# Patient Record
Sex: Female | Born: 1935 | Race: White | Hispanic: No | Marital: Married | Smoking: Former smoker
Health system: Southern US, Community
[De-identification: ages and names within clinical notes are randomized; demographics above are authoritative.]

## PROBLEM LIST (undated history)

## (undated) DIAGNOSIS — G8929 Other chronic pain: Secondary | ICD-10-CM

## (undated) DIAGNOSIS — N6019 Diffuse cystic mastopathy of unspecified breast: Secondary | ICD-10-CM

## (undated) DIAGNOSIS — E785 Hyperlipidemia, unspecified: Secondary | ICD-10-CM

## (undated) DIAGNOSIS — K5792 Diverticulitis of intestine, part unspecified, without perforation or abscess without bleeding: Secondary | ICD-10-CM

## (undated) DIAGNOSIS — C50919 Malignant neoplasm of unspecified site of unspecified female breast: Secondary | ICD-10-CM

## (undated) DIAGNOSIS — K921 Melena: Secondary | ICD-10-CM

## (undated) DIAGNOSIS — M549 Dorsalgia, unspecified: Secondary | ICD-10-CM

## (undated) DIAGNOSIS — E039 Hypothyroidism, unspecified: Secondary | ICD-10-CM

## (undated) DIAGNOSIS — K449 Diaphragmatic hernia without obstruction or gangrene: Secondary | ICD-10-CM

## (undated) DIAGNOSIS — G43909 Migraine, unspecified, not intractable, without status migrainosus: Secondary | ICD-10-CM

## (undated) DIAGNOSIS — K219 Gastro-esophageal reflux disease without esophagitis: Secondary | ICD-10-CM

## (undated) DIAGNOSIS — M1990 Unspecified osteoarthritis, unspecified site: Secondary | ICD-10-CM

## (undated) DIAGNOSIS — Z87442 Personal history of urinary calculi: Secondary | ICD-10-CM

## (undated) DIAGNOSIS — Z8489 Family history of other specified conditions: Secondary | ICD-10-CM

## (undated) DIAGNOSIS — C4491 Basal cell carcinoma of skin, unspecified: Secondary | ICD-10-CM

## (undated) DIAGNOSIS — K5790 Diverticulosis of intestine, part unspecified, without perforation or abscess without bleeding: Secondary | ICD-10-CM

## (undated) DIAGNOSIS — K635 Polyp of colon: Secondary | ICD-10-CM

## (undated) HISTORY — PX: ABDOMINAL HYSTERECTOMY: SHX81

## (undated) HISTORY — PX: KNEE ARTHROSCOPY: SUR90

## (undated) HISTORY — DX: Diaphragmatic hernia without obstruction or gangrene: K44.9

## (undated) HISTORY — DX: Diverticulosis of intestine, part unspecified, without perforation or abscess without bleeding: K57.90

## (undated) HISTORY — PX: CATARACT EXTRACTION, BILATERAL: SHX1313

## (undated) HISTORY — DX: Diffuse cystic mastopathy of unspecified breast: N60.19

## (undated) HISTORY — DX: Melena: K92.1

## (undated) HISTORY — DX: Hyperlipidemia, unspecified: E78.5

## (undated) HISTORY — DX: Gastro-esophageal reflux disease without esophagitis: K21.9

## (undated) HISTORY — DX: Unspecified osteoarthritis, unspecified site: M19.90

## (undated) HISTORY — PX: ESOPHAGOGASTRODUODENOSCOPY: SHX1529

## (undated) HISTORY — DX: Diverticulitis of intestine, part unspecified, without perforation or abscess without bleeding: K57.92

## (undated) HISTORY — DX: Polyp of colon: K63.5

## (undated) HISTORY — DX: Migraine, unspecified, not intractable, without status migrainosus: G43.909

## (undated) HISTORY — DX: Basal cell carcinoma of skin, unspecified: C44.91

---

## 1978-09-23 HISTORY — PX: VEIN LIGATION: SHX2652

## 1979-09-24 HISTORY — PX: TOTAL ABDOMINAL HYSTERECTOMY W/ BILATERAL SALPINGOOPHORECTOMY: SHX83

## 1984-09-23 HISTORY — PX: BLADDER SUSPENSION: SHX72

## 1988-09-23 HISTORY — PX: FOOT SURGERY: SHX648

## 1998-06-22 ENCOUNTER — Other Ambulatory Visit: Admission: RE | Admit: 1998-06-22 | Discharge: 1998-06-22 | Payer: Self-pay | Admitting: Obstetrics and Gynecology

## 1999-09-24 HISTORY — PX: HEEL SPUR EXCISION: SHX1733

## 1999-11-08 ENCOUNTER — Other Ambulatory Visit: Admission: RE | Admit: 1999-11-08 | Discharge: 1999-11-08 | Payer: Self-pay | Admitting: Obstetrics and Gynecology

## 1999-11-13 ENCOUNTER — Encounter: Payer: Self-pay | Admitting: Emergency Medicine

## 1999-11-13 ENCOUNTER — Emergency Department (HOSPITAL_COMMUNITY): Admission: EM | Admit: 1999-11-13 | Discharge: 1999-11-13 | Payer: Self-pay | Admitting: Emergency Medicine

## 2000-01-29 ENCOUNTER — Encounter: Payer: Self-pay | Admitting: Family Medicine

## 2000-01-29 ENCOUNTER — Ambulatory Visit (HOSPITAL_COMMUNITY): Admission: RE | Admit: 2000-01-29 | Discharge: 2000-01-29 | Payer: Self-pay | Admitting: Family Medicine

## 2000-09-23 HISTORY — PX: CHOLECYSTECTOMY: SHX55

## 2000-11-03 ENCOUNTER — Encounter: Payer: Self-pay | Admitting: Emergency Medicine

## 2000-11-03 ENCOUNTER — Encounter: Payer: Self-pay | Admitting: *Deleted

## 2000-11-03 ENCOUNTER — Emergency Department (HOSPITAL_COMMUNITY): Admission: EM | Admit: 2000-11-03 | Discharge: 2000-11-03 | Payer: Self-pay | Admitting: Emergency Medicine

## 2000-11-10 ENCOUNTER — Ambulatory Visit (HOSPITAL_COMMUNITY): Admission: RE | Admit: 2000-11-10 | Discharge: 2000-11-11 | Payer: Self-pay | Admitting: General Surgery

## 2000-11-10 ENCOUNTER — Encounter (INDEPENDENT_AMBULATORY_CARE_PROVIDER_SITE_OTHER): Payer: Self-pay | Admitting: *Deleted

## 2000-11-10 ENCOUNTER — Encounter (HOSPITAL_BASED_OUTPATIENT_CLINIC_OR_DEPARTMENT_OTHER): Payer: Self-pay | Admitting: General Surgery

## 2003-05-24 ENCOUNTER — Other Ambulatory Visit: Admission: RE | Admit: 2003-05-24 | Discharge: 2003-05-24 | Payer: Self-pay | Admitting: Family Medicine

## 2003-07-18 ENCOUNTER — Encounter: Payer: Self-pay | Admitting: Family Medicine

## 2003-07-18 ENCOUNTER — Ambulatory Visit (HOSPITAL_COMMUNITY): Admission: RE | Admit: 2003-07-18 | Discharge: 2003-07-18 | Payer: Self-pay | Admitting: Family Medicine

## 2004-06-21 ENCOUNTER — Ambulatory Visit (HOSPITAL_COMMUNITY): Admission: RE | Admit: 2004-06-21 | Discharge: 2004-06-21 | Payer: Self-pay | Admitting: Family Medicine

## 2004-08-30 ENCOUNTER — Ambulatory Visit (HOSPITAL_COMMUNITY): Admission: RE | Admit: 2004-08-30 | Discharge: 2004-08-30 | Payer: Self-pay | Admitting: Family Medicine

## 2005-05-17 ENCOUNTER — Encounter: Admission: RE | Admit: 2005-05-17 | Discharge: 2005-05-17 | Payer: Self-pay | Admitting: Family Medicine

## 2005-10-01 ENCOUNTER — Encounter: Admission: RE | Admit: 2005-10-01 | Discharge: 2005-10-01 | Payer: Self-pay | Admitting: Family Medicine

## 2005-11-04 ENCOUNTER — Ambulatory Visit (HOSPITAL_COMMUNITY): Admission: RE | Admit: 2005-11-04 | Discharge: 2005-11-04 | Payer: Self-pay | Admitting: Neurosurgery

## 2005-11-07 ENCOUNTER — Other Ambulatory Visit: Admission: RE | Admit: 2005-11-07 | Discharge: 2005-11-07 | Payer: Self-pay | Admitting: Family Medicine

## 2006-12-12 ENCOUNTER — Ambulatory Visit (HOSPITAL_COMMUNITY): Admission: RE | Admit: 2006-12-12 | Discharge: 2006-12-12 | Payer: Self-pay | Admitting: Family Medicine

## 2006-12-23 HISTORY — PX: COLONOSCOPY: SHX174

## 2009-08-24 ENCOUNTER — Encounter: Admission: RE | Admit: 2009-08-24 | Discharge: 2009-08-24 | Payer: Self-pay | Admitting: Gastroenterology

## 2010-10-12 ENCOUNTER — Encounter
Admission: RE | Admit: 2010-10-12 | Discharge: 2010-10-12 | Payer: Self-pay | Source: Home / Self Care | Attending: Gastroenterology | Admitting: Gastroenterology

## 2011-02-08 NOTE — Op Note (Signed)
[place]  Patient:    Jamie Beltran, Jamie Beltran                      MRN: [id number] Proc. Date: 11/10/00 Adm. Date:  [id number] Attending:  Fortino Sic CC:         Lurena Joiner, M.D.   Operative Report  PREOPERATIVE DIAGNOSIS:  Symptomatic gallstones.  POSTOPERATIVE DIAGNOSIS:  Symptomatic gallstones.  OPERATION PERFORMED:  Laparoscopic cholecystectomy with operative cholangiogram.  SURGEON:  Marnee Spring. Wiliam Ke, M.D.  ASSISTANT:  Mardene Celeste. Lurene Shadow, M.D.  ANESTHESIA:  Endotracheal by hospital.  PROCEDURE:  Under endotracheal anesthesia, the abdomen was prepped and draped in the usual manner.  An incision was made above the umbilicus.  The abdomen was entered.  A pursestring suture was placed.  A Hasson cannula was inserted. Pneumoperitoneum was obtained.  The peritoneal surfaces appeared normal.  A 10 mm and two 5 mm cannulas were placed in the usual fashion.  ______ was passed.  The cystic duct and gallbladder junction was identified.  The cystic duct was opened and cannulated with the Reddick catheter.  A cholangiogram was performed and was normal.  The cystic artery was triply clipped ______ clips proximally, and the cystic duct was divided after the Reddick catheter was removed, and two clips were left proximally.  The gallbladder was removed from the liver bed with electrocautery current, and hemostasis was excellent.  The gallbladder was removed through the umbilical port ______ and sucked dry. The cannula was removed.  The pursestring suture was tied.  The skin wounds were closed with subcuticular 4-0 Dexon, and Steri-Strips were applied.  The estimated blood loss was minimal.  The patient received no blood.  She left the operating room in satisfactory condition after sponge and needle counts were verified. DD:  11/10/00 TD:  11/10/00 Job: [id number]

## 2011-07-08 ENCOUNTER — Other Ambulatory Visit: Payer: Self-pay | Admitting: Family Medicine

## 2011-07-08 DIAGNOSIS — H93A9 Pulsatile tinnitus, unspecified ear: Secondary | ICD-10-CM

## 2011-07-11 ENCOUNTER — Ambulatory Visit
Admission: RE | Admit: 2011-07-11 | Discharge: 2011-07-11 | Disposition: A | Discharge status: Home / Self Care | Payer: Medicare Other | Source: Ambulatory Visit | Attending: Family Medicine | Admitting: Family Medicine

## 2011-07-11 DIAGNOSIS — H93A9 Pulsatile tinnitus, unspecified ear: Secondary | ICD-10-CM

## 2011-09-24 ENCOUNTER — Ambulatory Visit (INDEPENDENT_AMBULATORY_CARE_PROVIDER_SITE_OTHER): Payer: Medicare Other

## 2011-09-24 DIAGNOSIS — R05 Cough: Secondary | ICD-10-CM

## 2011-09-24 DIAGNOSIS — R059 Cough, unspecified: Secondary | ICD-10-CM

## 2011-10-24 ENCOUNTER — Other Ambulatory Visit: Payer: Self-pay | Admitting: Neurosurgery

## 2011-10-24 DIAGNOSIS — M25561 Pain in right knee: Secondary | ICD-10-CM

## 2011-10-26 ENCOUNTER — Ambulatory Visit
Admission: RE | Admit: 2011-10-26 | Discharge: 2011-10-26 | Disposition: A | Discharge status: Home / Self Care | Payer: Medicare Other | Source: Ambulatory Visit | Attending: Neurosurgery | Admitting: Neurosurgery

## 2011-10-26 DIAGNOSIS — M25561 Pain in right knee: Secondary | ICD-10-CM

## 2011-10-28 ENCOUNTER — Other Ambulatory Visit: Payer: Self-pay | Admitting: Family Medicine

## 2011-10-28 DIAGNOSIS — R51 Headache: Secondary | ICD-10-CM

## 2011-11-18 ENCOUNTER — Ambulatory Visit
Admission: RE | Admit: 2011-11-18 | Discharge: 2011-11-18 | Disposition: A | Discharge status: Home / Self Care | Payer: Medicare Other | Source: Ambulatory Visit | Attending: Family Medicine | Admitting: Family Medicine

## 2011-11-18 DIAGNOSIS — R51 Headache: Secondary | ICD-10-CM

## 2011-12-11 ENCOUNTER — Other Ambulatory Visit: Payer: Self-pay | Admitting: Neurosurgery

## 2011-12-11 DIAGNOSIS — M47816 Spondylosis without myelopathy or radiculopathy, lumbar region: Secondary | ICD-10-CM

## 2011-12-16 ENCOUNTER — Ambulatory Visit
Admission: RE | Admit: 2011-12-16 | Discharge: 2011-12-16 | Disposition: A | Discharge status: Home / Self Care | Payer: Medicare Other | Source: Ambulatory Visit | Attending: Neurosurgery | Admitting: Neurosurgery

## 2011-12-16 DIAGNOSIS — M47816 Spondylosis without myelopathy or radiculopathy, lumbar region: Secondary | ICD-10-CM

## 2012-01-02 ENCOUNTER — Other Ambulatory Visit: Payer: Self-pay | Admitting: Neurosurgery

## 2012-01-02 DIAGNOSIS — M47816 Spondylosis without myelopathy or radiculopathy, lumbar region: Secondary | ICD-10-CM

## 2012-01-03 ENCOUNTER — Ambulatory Visit
Admission: RE | Admit: 2012-01-03 | Discharge: 2012-01-03 | Disposition: A | Discharge status: Home / Self Care | Payer: Medicare Other | Source: Ambulatory Visit | Attending: Neurosurgery | Admitting: Neurosurgery

## 2012-01-03 VITALS — BP 105/67 | HR 75

## 2012-01-03 DIAGNOSIS — M47816 Spondylosis without myelopathy or radiculopathy, lumbar region: Secondary | ICD-10-CM

## 2012-01-03 DIAGNOSIS — M5126 Other intervertebral disc displacement, lumbar region: Secondary | ICD-10-CM

## 2012-01-03 MED ORDER — METHYLPREDNISOLONE ACETATE 40 MG/ML INJ SUSP (RADIOLOG
120.0000 mg | Freq: Once | INTRAMUSCULAR | Status: AC
  Administered 2012-01-03: 120 mg via EPIDURAL

## 2012-01-03 MED ORDER — IOHEXOL 180 MG/ML  SOLN
1.0000 mL | Freq: Once | INTRAMUSCULAR | Status: AC | PRN
  Administered 2012-01-03: 1 mL via EPIDURAL

## 2012-01-03 NOTE — Discharge Instructions (Signed)

## 2012-01-13 ENCOUNTER — Ambulatory Visit: Payer: Medicare Other | Attending: Neurosurgery | Admitting: Physical Therapy

## 2012-01-13 DIAGNOSIS — M545 Low back pain, unspecified: Secondary | ICD-10-CM | POA: Insufficient documentation

## 2012-01-13 DIAGNOSIS — M25559 Pain in unspecified hip: Secondary | ICD-10-CM | POA: Insufficient documentation

## 2012-01-13 DIAGNOSIS — M2569 Stiffness of other specified joint, not elsewhere classified: Secondary | ICD-10-CM | POA: Insufficient documentation

## 2012-01-13 DIAGNOSIS — IMO0001 Reserved for inherently not codable concepts without codable children: Secondary | ICD-10-CM | POA: Insufficient documentation

## 2012-01-15 ENCOUNTER — Ambulatory Visit: Payer: Medicare Other | Admitting: Physical Therapy

## 2012-01-21 ENCOUNTER — Ambulatory Visit: Payer: Medicare Other | Admitting: Physical Therapy

## 2012-01-24 ENCOUNTER — Ambulatory Visit: Payer: Medicare Other | Attending: Neurosurgery | Admitting: Physical Therapy

## 2012-01-24 DIAGNOSIS — M2569 Stiffness of other specified joint, not elsewhere classified: Secondary | ICD-10-CM | POA: Insufficient documentation

## 2012-01-24 DIAGNOSIS — M25559 Pain in unspecified hip: Secondary | ICD-10-CM | POA: Insufficient documentation

## 2012-01-24 DIAGNOSIS — M545 Low back pain, unspecified: Secondary | ICD-10-CM | POA: Insufficient documentation

## 2012-01-24 DIAGNOSIS — IMO0001 Reserved for inherently not codable concepts without codable children: Secondary | ICD-10-CM | POA: Insufficient documentation

## 2012-01-28 ENCOUNTER — Ambulatory Visit: Payer: Medicare Other | Admitting: Physical Therapy

## 2012-01-30 ENCOUNTER — Ambulatory Visit: Payer: Medicare Other | Admitting: Physical Therapy

## 2012-02-04 ENCOUNTER — Ambulatory Visit: Payer: Medicare Other | Admitting: Physical Therapy

## 2012-02-06 ENCOUNTER — Ambulatory Visit: Payer: Medicare Other | Admitting: Physical Therapy

## 2012-02-11 ENCOUNTER — Ambulatory Visit: Payer: Medicare Other | Admitting: Physical Therapy

## 2012-02-13 ENCOUNTER — Ambulatory Visit: Payer: Medicare Other | Admitting: Physical Therapy

## 2012-02-19 ENCOUNTER — Ambulatory Visit: Payer: Medicare Other | Admitting: Physical Therapy

## 2012-02-20 ENCOUNTER — Ambulatory Visit: Payer: Medicare Other | Admitting: Physical Therapy

## 2012-03-05 ENCOUNTER — Other Ambulatory Visit: Payer: Self-pay | Admitting: Neurosurgery

## 2012-03-05 DIAGNOSIS — M7060 Trochanteric bursitis, unspecified hip: Secondary | ICD-10-CM

## 2012-03-09 ENCOUNTER — Ambulatory Visit
Admission: RE | Admit: 2012-03-09 | Discharge: 2012-03-09 | Disposition: A | Discharge status: Home / Self Care | Payer: Medicare Other | Source: Ambulatory Visit | Attending: Neurosurgery | Admitting: Neurosurgery

## 2012-03-09 DIAGNOSIS — M7060 Trochanteric bursitis, unspecified hip: Secondary | ICD-10-CM

## 2012-03-09 MED ORDER — METHYLPREDNISOLONE ACETATE 40 MG/ML INJ SUSP (RADIOLOG
120.0000 mg | Freq: Once | INTRAMUSCULAR | Status: AC
  Administered 2012-03-09: 120 mg via INTRAMUSCULAR

## 2012-03-09 MED ORDER — IOHEXOL 180 MG/ML  SOLN
1.0000 mL | Freq: Once | INTRAMUSCULAR | Status: AC | PRN
  Administered 2012-03-09: 1 mL

## 2013-01-11 ENCOUNTER — Other Ambulatory Visit: Payer: Self-pay | Admitting: Family Medicine

## 2013-01-11 DIAGNOSIS — K469 Unspecified abdominal hernia without obstruction or gangrene: Secondary | ICD-10-CM

## 2013-01-11 DIAGNOSIS — R109 Unspecified abdominal pain: Secondary | ICD-10-CM

## 2013-01-12 ENCOUNTER — Ambulatory Visit
Admission: RE | Admit: 2013-01-12 | Discharge: 2013-01-12 | Disposition: A | Discharge status: Home / Self Care | Payer: Medicare Other | Source: Ambulatory Visit | Attending: Family Medicine | Admitting: Family Medicine

## 2013-01-12 DIAGNOSIS — R109 Unspecified abdominal pain: Secondary | ICD-10-CM

## 2013-01-12 DIAGNOSIS — K469 Unspecified abdominal hernia without obstruction or gangrene: Secondary | ICD-10-CM

## 2013-01-12 MED ORDER — IOHEXOL 300 MG/ML  SOLN
100.0000 mL | Freq: Once | INTRAMUSCULAR | Status: AC | PRN
  Administered 2013-01-12: 100 mL via INTRAVENOUS

## 2013-10-30 ENCOUNTER — Encounter: Payer: Self-pay | Admitting: *Deleted

## 2014-05-05 ENCOUNTER — Emergency Department (HOSPITAL_COMMUNITY)
Admission: EM | Admit: 2014-05-05 | Discharge: 2014-05-05 | Disposition: A | Discharge status: Home / Self Care | Payer: Medicare Other | Attending: Emergency Medicine | Admitting: Emergency Medicine

## 2014-05-05 ENCOUNTER — Emergency Department (HOSPITAL_COMMUNITY): Payer: Medicare Other

## 2014-05-05 ENCOUNTER — Encounter (HOSPITAL_COMMUNITY): Payer: Self-pay | Admitting: Emergency Medicine

## 2014-05-05 DIAGNOSIS — Z860100 Personal history of colon polyps, unspecified: Secondary | ICD-10-CM | POA: Insufficient documentation

## 2014-05-05 DIAGNOSIS — K219 Gastro-esophageal reflux disease without esophagitis: Secondary | ICD-10-CM | POA: Insufficient documentation

## 2014-05-05 DIAGNOSIS — M1990 Unspecified osteoarthritis, unspecified site: Secondary | ICD-10-CM | POA: Diagnosis not present

## 2014-05-05 DIAGNOSIS — Z90710 Acquired absence of both cervix and uterus: Secondary | ICD-10-CM | POA: Diagnosis not present

## 2014-05-05 DIAGNOSIS — Z8742 Personal history of other diseases of the female genital tract: Secondary | ICD-10-CM | POA: Insufficient documentation

## 2014-05-05 DIAGNOSIS — Z880 Allergy status to penicillin: Secondary | ICD-10-CM | POA: Diagnosis not present

## 2014-05-05 DIAGNOSIS — Z862 Personal history of diseases of the blood and blood-forming organs and certain disorders involving the immune mechanism: Secondary | ICD-10-CM | POA: Diagnosis not present

## 2014-05-05 DIAGNOSIS — Y9389 Activity, other specified: Secondary | ICD-10-CM | POA: Diagnosis not present

## 2014-05-05 DIAGNOSIS — Z85828 Personal history of other malignant neoplasm of skin: Secondary | ICD-10-CM | POA: Insufficient documentation

## 2014-05-05 DIAGNOSIS — Y9229 Other specified public building as the place of occurrence of the external cause: Secondary | ICD-10-CM | POA: Diagnosis not present

## 2014-05-05 DIAGNOSIS — G43909 Migraine, unspecified, not intractable, without status migrainosus: Secondary | ICD-10-CM | POA: Insufficient documentation

## 2014-05-05 DIAGNOSIS — Z8639 Personal history of other endocrine, nutritional and metabolic disease: Secondary | ICD-10-CM | POA: Insufficient documentation

## 2014-05-05 DIAGNOSIS — S82899A Other fracture of unspecified lower leg, initial encounter for closed fracture: Secondary | ICD-10-CM | POA: Insufficient documentation

## 2014-05-05 DIAGNOSIS — R55 Syncope and collapse: Secondary | ICD-10-CM | POA: Diagnosis present

## 2014-05-05 DIAGNOSIS — X500XXA Overexertion from strenuous movement or load, initial encounter: Secondary | ICD-10-CM | POA: Insufficient documentation

## 2014-05-05 DIAGNOSIS — S82402A Unspecified fracture of shaft of left fibula, initial encounter for closed fracture: Secondary | ICD-10-CM

## 2014-05-05 DIAGNOSIS — Z79899 Other long term (current) drug therapy: Secondary | ICD-10-CM | POA: Diagnosis not present

## 2014-05-05 LAB — CBC WITH DIFFERENTIAL/PLATELET
Basophils Absolute: 0 10*3/uL (ref 0.0–0.1)
Basophils Relative: 0 % (ref 0–1)
Eosinophils Absolute: 0.1 10*3/uL (ref 0.0–0.7)
Eosinophils Relative: 1 % (ref 0–5)
HCT: 37.1 % (ref 36.0–46.0)
HEMOGLOBIN: 12.5 g/dL (ref 12.0–15.0)
LYMPHS ABS: 1.7 10*3/uL (ref 0.7–4.0)
LYMPHS PCT: 20 % (ref 12–46)
MCH: 30.3 pg (ref 26.0–34.0)
MCHC: 33.7 g/dL (ref 30.0–36.0)
MCV: 90 fL (ref 78.0–100.0)
MONOS PCT: 11 % (ref 3–12)
Monocytes Absolute: 0.9 10*3/uL (ref 0.1–1.0)
NEUTROS PCT: 68 % (ref 43–77)
Neutro Abs: 5.7 10*3/uL (ref 1.7–7.7)
PLATELETS: 209 10*3/uL (ref 150–400)
RBC: 4.12 MIL/uL (ref 3.87–5.11)
RDW: 13.4 % (ref 11.5–15.5)
WBC: 8.3 10*3/uL (ref 4.0–10.5)

## 2014-05-05 LAB — COMPREHENSIVE METABOLIC PANEL
ALT: 15 U/L (ref 0–35)
AST: 18 U/L (ref 0–37)
Albumin: 3.8 g/dL (ref 3.5–5.2)
Alkaline Phosphatase: 68 U/L (ref 39–117)
Anion gap: 11 (ref 5–15)
BUN: 13 mg/dL (ref 6–23)
CO2: 25 mEq/L (ref 19–32)
Calcium: 9.7 mg/dL (ref 8.4–10.5)
Chloride: 103 mEq/L (ref 96–112)
Creatinine, Ser: 0.75 mg/dL (ref 0.50–1.10)
GFR calc Af Amer: >90 mL/min (ref >90)
GFR calc non Af Amer: 80 mL/min — ABNORMAL LOW (ref >90)
Glucose, Bld: 116 mg/dL — ABNORMAL HIGH (ref 70–99)
Potassium: 4 mEq/L (ref 3.7–5.3)
Sodium: 139 mEq/L (ref 137–147)
Total Bilirubin: 1.3 mg/dL — ABNORMAL HIGH (ref 0.3–1.2)
Total Protein: 7 g/dL (ref 6.0–8.3)

## 2014-05-05 LAB — MAGNESIUM: Magnesium: 2.2 mg/dL (ref 1.5–2.5)

## 2014-05-05 LAB — TROPONIN I: Troponin I: <0.3 ng/mL (ref <0.30)

## 2014-05-05 MED ORDER — OXYCODONE-ACETAMINOPHEN 5-325 MG PO TABS: 1.0000 | ORAL_TABLET | Freq: Four times a day (QID) | ORAL | Status: DC | PRN

## 2014-05-05 MED ORDER — FENTANYL CITRATE 0.05 MG/ML IJ SOLN
50.0000 ug | Freq: Once | INTRAMUSCULAR | Status: AC
  Administered 2014-05-05: 50 ug via INTRAVENOUS
  Filled 2014-05-05: qty 2

## 2014-05-05 MED ORDER — OXYCODONE-ACETAMINOPHEN 5-325 MG PO TABS
1.0000 | ORAL_TABLET | Freq: Once | ORAL | Status: AC
  Administered 2014-05-05: 1 via ORAL
  Filled 2014-05-05: qty 1

## 2014-05-05 MED ORDER — ONDANSETRON HCL 4 MG/2ML IJ SOLN
4.0000 mg | Freq: Four times a day (QID) | INTRAMUSCULAR | Status: DC | PRN
Start: 2014-05-05 — End: 2014-05-05

## 2014-05-05 NOTE — ED Notes (Signed)
Bed: TC48 Expected date:  Expected time:  Means of arrival:  Comments: Near syncope

## 2014-05-05 NOTE — Progress Notes (Signed)
  CARE MANAGEMENT ED NOTE 05/05/2014  Patient:  Jamie Beltran, Jamie Beltran   Account Number:  [id number]  Date Initiated:  05/05/2014  Documentation initiated by:  Jackelyn Poling  Subjective/Objective Assessment:   79 yr old blue [place] pt c/o near syncope with hx of such. Pt denies LOC; during event was lowered to ground resulting in left ankle swelling. Pedal pulses present bilaterally; swelling noted to left ankle.     Subjective/Objective Assessment Detail:   no pcp listed  per pt pcp is Jamie Beltran     Action/Plan:   ED CM noted pt without pcp Spoke with her Updated EPIC   Action/Plan Detail:   Anticipated DC Date:  05/05/2014     Status Recommendation to Physician:   Result of Recommendation:    Other ED [place]  Other  PCP issues  Outpatient Services - Pt will follow up    Choice offered to / List presented to:            Status of service:  Completed, signed off  ED Comments:   ED Comments Detail:

## 2014-05-05 NOTE — ED Notes (Signed)
MD at bedside. 

## 2014-05-05 NOTE — ED Provider Notes (Signed)
CSN: [id number]     Arrival date & time 05/05/14  1416 History   First MD Initiated Contact with Patient 05/05/14 1455     Chief Complaint  Patient presents with  . Near Syncope     (Consider location/radiation/quality/duration/timing/severity/associated sxs/prior Treatment) HPI Patient states that she had a "sinking spell" today while she was in a store shopping. She states she's been having these episodes off and on for about a year. She states she's been tested and all her tests have been normal except for a low thyroid. She states today they had been standing and she got clammy, she started seeing stars, and then felt like she was going to pass out. She did sit on a stool however she was not able to lay back. She said she got up to walk to the car and she states her legs gave out from under her and she twisted her left ankle. She did not hit her head or have loss of consciousness. She states her only pain right now is in her left ankle. She states the spells have been getting more often and they usually are several weeks apart. However this is the second episode she has had this week. She states the episodes normally come when she has been overexerting herself. Her daughter states she did not change color today. She did become very diaphoretic and today she felt short of breath which is unusual. She denies chest pain. She has nausea without vomiting. She denies palpitations. She states it normally lasts 1-2 minutes. The episodes always occur while she has been standing for a while except for today. She states she normally lays down and it goes totally away. However today while laying on the stretcher in the EMS ambulance she started having a second episode and her nausea returned. She was given nausea medicine in the ambulance. She states she feels her ankle is broken.   PCP Dr Tomasita Crumble Orthopedics Dr Wynelle Link (hips), Dr Sharol Given (knees-arthroscopic) Neurosurgery Dr Carloyn Manner for her back, appointment  tomorrow  Past Medical History  Diagnosis Date  . Migraine headache   . Fibrocystic breast disease   . Esophageal reflux   . Hiatal hernia   . Colonic polyp   . OA (osteoarthritis)     back, hips, knees  . Other and unspecified hyperlipidemia   . Diverticulosis   . Diverticulitis   . Blood in stool   . Basal cell carcinoma    Past Surgical History  Procedure Laterality Date  . Total abdominal hysterectomy w/ bilateral salpingoophorectomy  1981  . Bladder suspension  1986  . Cholecystectomy  2002  . Knee arthroscopy Bilateral 2006, 2013  . Heel spur excision  2001  . Vein ligation Left 1980  . Cataract extraction, bilateral    . Esophagogastroduodenoscopy  9/06, 4/08  . Foot surgery  1990  . Colonoscopy  04/08   Family History  Problem Relation Age of Onset  . Hypertension Father   . Heart attack Mother   . Kidney Stones Sister     kidney problems  . Kidney Stones Sister     kidney transplant   History  Substance Use Topics  . Smoking status: Not on file  . Smokeless tobacco: Not on file  . Alcohol Use: Not on file   Lives at home lives with spouse  Denies Cigarettes or ETOH use  OB History   Grav Para Term Preterm Abortions TAB SAB Ect Mult Living  Review of Systems  All other systems reviewed and are negative.     Allergies  Codeine and Penicillins  Home Medications   Prior to Admission medications   Medication Sig Start Date End Date Taking? Authorizing Provider  FIBER DIET TABS Take 2 tablets by mouth daily.   Yes Historical Provider, MD  gabapentin (NEURONTIN) 300 MG capsule Take 300 mg by mouth 3 (three) times daily.   Yes Historical Provider, MD  HYDROcodone-acetaminophen (NORCO) 7.5-325 MG per tablet Take 1 tablet by mouth every 12 (twelve) hours as needed for moderate pain.   Yes Historical Provider, MD  levothyroxine (SYNTHROID, LEVOTHROID) 50 MCG tablet Take 50 mcg by mouth daily before breakfast.   Yes Historical  Provider, MD  pantoprazole (PROTONIX) 40 MG tablet Take 40 mg by mouth daily.   Yes Historical Provider, MD   BP 118/57  Pulse 75  Temp(Src) 97.7 F (36.5 C) (Oral)  Resp 16  SpO2 100%  Laboratory interpretation all normal except   Physical Exam  Nursing note and vitals reviewed. Constitutional: She is oriented to person, place, and time. She appears well-developed and well-nourished.  Non-toxic appearance. She does not appear ill. No distress.  HENT:  Head: Normocephalic and atraumatic.  Right Ear: External ear normal.  Left Ear: External ear normal.  Nose: Nose normal. No mucosal edema or rhinorrhea.  Mouth/Throat: Oropharynx is clear and moist and mucous membranes are normal. No dental abscesses or uvula swelling.  Eyes: Conjunctivae and EOM are normal. Pupils are equal, round, and reactive to light.  Neck: Normal range of motion and full passive range of motion without pain. Neck supple.  Cardiovascular: Normal rate, regular rhythm and normal heart sounds.  Exam reveals no gallop and no friction rub.   No murmur heard. Pulmonary/Chest: Effort normal and breath sounds normal. No respiratory distress. She has no wheezes. She has no rhonchi. She has no rales. She exhibits no tenderness and no crepitus.  Abdominal: Soft. Normal appearance and bowel sounds are normal. She exhibits no distension. There is no tenderness. There is no rebound and no guarding.  Musculoskeletal: Normal range of motion. She exhibits edema and tenderness.  Pt has swelling and pain of her left ankle. Nontender left foot. Nontender left knee. No other tender areas.   Neurological: She is alert and oriented to person, place, and time. She has normal strength. No cranial nerve deficit.  Skin: Skin is warm, dry and intact. No rash noted. No erythema. No pallor.  Psychiatric: She has a normal mood and affect. Her speech is normal and behavior is normal. Her mood appears not anxious.    ED Course  Procedures  (including critical care time)  Medications  ondansetron (ZOFRAN) injection 4 mg (not administered)  oxyCODONE-acetaminophen (PERCOCET/ROXICET) 5-325 MG per tablet 1 tablet (not administered)  fentaNYL (SUBLIMAZE) injection 50 mcg (50 mcg Intravenous Given 05/05/14 1553)   Pt given more pain medications. She was placed in a cam walker. She states her hydrocodone will not be able to control the pain from this fracture.     Labs Review Results for orders placed during the hospital encounter of 05/05/14  CBC WITH DIFFERENTIAL      Result Value Ref Range   WBC 8.3  4.0 - 10.5 K/uL   RBC 4.12  3.87 - 5.11 MIL/uL   Hemoglobin 12.5  12.0 - 15.0 g/dL   HCT 37.1  36.0 - 46.0 %   MCV 90.0  78.0 - 100.0 fL   MCH 30.3  26.0 - 34.0 pg   MCHC 33.7  30.0 - 36.0 g/dL   RDW 13.4  11.5 - 15.5 %   Platelets 209  150 - 400 K/uL   Neutrophils Relative % 68  43 - 77 %   Neutro Abs 5.7  1.7 - 7.7 K/uL   Lymphocytes Relative 20  12 - 46 %   Lymphs Abs 1.7  0.7 - 4.0 K/uL   Monocytes Relative 11  3 - 12 %   Monocytes Absolute 0.9  0.1 - 1.0 K/uL   Eosinophils Relative 1  0 - 5 %   Eosinophils Absolute 0.1  0.0 - 0.7 K/uL   Basophils Relative 0  0 - 1 %   Basophils Absolute 0.0  0.0 - 0.1 K/uL  COMPREHENSIVE METABOLIC PANEL      Result Value Ref Range   Sodium 139  137 - 147 mEq/L   Potassium 4.0  3.7 - 5.3 mEq/L   Chloride 103  96 - 112 mEq/L   CO2 25  19 - 32 mEq/L   Glucose, Bld 116 (*) 70 - 99 mg/dL   BUN 13  6 - 23 mg/dL   Creatinine, Ser 0.75  0.50 - 1.10 mg/dL   Calcium 9.7  8.4 - 10.5 mg/dL   Total Protein 7.0  6.0 - 8.3 g/dL   Albumin 3.8  3.5 - 5.2 g/dL   AST 18  0 - 37 U/L   ALT 15  0 - 35 U/L   Alkaline Phosphatase 68  39 - 117 U/L   Total Bilirubin 1.3 (*) 0.3 - 1.2 mg/dL   GFR calc non Af Amer 80 (*) >90 mL/min   GFR calc Af Amer >90  >90 mL/min   Anion gap 11  5 - 15  TROPONIN I      Result Value Ref Range   Troponin I <0.30  <0.30 ng/mL  MAGNESIUM      Result Value Ref  Range   Magnesium 2.2  1.5 - 2.5 mg/dL   Laboratory interpretation all normal      Imaging Review Dg Ankle Complete Left  05/05/2014   CLINICAL DATA:  Recent traumatic injury with pain  EXAM: LEFT ANKLE COMPLETE - 3+ VIEW  COMPARISON:  None.  FINDINGS: There is an oblique fracture through the distal left fibula. No tibial fracture is seen. Calcaneal spurs are noted. No gross soft tissue abnormality is seen.  IMPRESSION: Distal left fibular fracture   Electronically Signed   By: Inez Catalina M.D.   On: 05/05/2014 15:49     EKG Interpretation   Date/Time:  Thursday May 05 2014 14:29:33 EDT Ventricular Rate:  71 PR Interval:  177 QRS Duration: 86 QT Interval:  419 QTC Calculation: 455 R Axis:   49 Text Interpretation:  Sinus rhythm Abnormal R-wave progression, early  transition Nonspecific T abnormalities, anterior leads No significant  change was found Confirmed by CAMPOS  MD, Lennette Bihari ([id number]) on 05/05/2014  2:33:32 PM      MDM patient reports history of near syncope that has been evaluated by her PCP already. Today she had injury and fractured her distal fibula. She was placed in a cam walker and she has an orthopedist to followup with. She's also advised to follow up with her PCP if she is not had a Holter monitor to consider that.    Final diagnoses:  Near syncope  Closed fibular fracture, left, initial encounter     New Prescriptions   OXYCODONE-ACETAMINOPHEN (PERCOCET/ROXICET)  5-325 MG PER TABLET    Take 1 tablet by mouth every 6 (six) hours as needed for severe pain.    Plan discharge   Rolland Porter, MD, Alanson Aly, MD 05/05/14 [phone]

## 2014-05-05 NOTE — ED Notes (Signed)
Pt denies nausea.

## 2014-05-05 NOTE — Discharge Instructions (Signed)
Elevate your foot. Use ice packs to get the swelling down. Wear the cam walker to stabilize the fracture. Call Dr Aluisio's office to get an appointment to be rechecked in the next week. Talk to Dr Doreene Adas office about your syncope. If you haven't have a holter monitor they might consider doing that to make sure you aren't having an abnormal heart rhythm when you have your spells.

## 2014-05-05 NOTE — ED Notes (Addendum)
Pt complaint of near syncope with hx of such. Pt denies LOC; during event was lowered to ground resulting in left ankle swelling. Pedal pulses present bilaterally; swelling noted to left ankle.  Pt recent increase in thyroid medication and taken diet pill with eating today.

## 2014-05-16 ENCOUNTER — Encounter: Payer: Self-pay | Admitting: Diagnostic Neuroimaging

## 2014-05-16 ENCOUNTER — Ambulatory Visit (INDEPENDENT_AMBULATORY_CARE_PROVIDER_SITE_OTHER): Payer: MEDICARE | Admitting: Diagnostic Neuroimaging

## 2014-05-16 VITALS — BP 141/76 | HR 66 | Temp 97.3°F

## 2014-05-16 DIAGNOSIS — R55 Syncope and collapse: Secondary | ICD-10-CM

## 2014-05-16 NOTE — Progress Notes (Signed)
[facility]  PATIENT: Jamie Beltran DOB: 07/03/1935  REFERRING CLINICIAN: Maia Plan HISTORY FROM: patient and husband REASON FOR VISIT: new consult   HISTORICAL  CHIEF COMPLAINT:  No chief complaint on file.   HISTORY OF PRESENT ILLNESS:   79 year old female with intermittent episodes of lightheadedness and syncope.  For past several years patient has had intermittent episodes of lightheadedness, clammy sensation, dizziness, feeling as though she may pass out. Usually she is able to sit down or lay down and avoid passing out. On 05/05/14, patient was shopping with a friend, when she began to see stars and spots, felt clammy, lightheaded, palpitations, and tried to sit down. When she stood up, patient fell down and passed out. Patient thinks he lost consciousness just for a few seconds. No tongue biting or incontinence. No convulsions. No postictal confusion. Apparently a bystander who is a nurse checked her blood pressure was found to be "low". When paramedics arrived and evaluated patient her systolic blood pressure was 114. Patient was taken to the emergency room for evaluation.  Since that time patient has had a few more lightheaded dizzy episodes. Some of these episodes have occurred while sitting down. Patient has had a few episodes even when laying down early in the morning. She has not had syncope with any of these events. Most of her events have occurred while standing for long period of time.  Patient has a grandson with partial epilepsy. Patient saw her back surgeon really recently, who also was concerned about similar diagnosis for patient.  Of note I evaluated patient in 2013 for headaches, dizziness, vision changes, and at that time she was having intermittent hypotension episodes with systolic pressures in the 80s to 90s. Patient was on a fluid pill that time which had been discontinued. Patient had cardiac testing at that time which was unremarkable.  Patient also had MRI of the brain, MRA head, echocardiogram, carotid ultrasound which were unremarkable. Over time her symptoms resolved.   REVIEW OF SYSTEMS: Full 14 system review of systems performed and notable only for fatigue palpitations swelling of legs hearing loss ringing in ears diarrhea snoring shortness of upper division and not feeling cold increased thirst weakness dizziness passing out.  ALLERGIES: Allergies  Allergen Reactions  . Codeine Other (See Comments)    "Makes me crazy."  . Penicillins Hives and Swelling    HOME MEDICATIONS: Outpatient Prescriptions Prior to Visit  Medication Sig Dispense Refill  . levothyroxine (SYNTHROID, LEVOTHROID) 50 MCG tablet Take 50 mcg by mouth daily before breakfast.      . pantoprazole (PROTONIX) 40 MG tablet Take 40 mg by mouth daily.      Marland Kitchen HYDROcodone-acetaminophen (NORCO) 7.5-325 MG per tablet Take 1 tablet by mouth every 12 (twelve) hours as needed for moderate pain.      Marland Kitchen gabapentin (NEURONTIN) 300 MG capsule Take 300 mg by mouth 3 (three) times daily.      Marland Kitchen FIBER DIET TABS Take 2 tablets by mouth daily.      Marland Kitchen oxyCODONE-acetaminophen (PERCOCET/ROXICET) 5-325 MG per tablet Take 1 tablet by mouth every 6 (six) hours as needed for severe pain.  20 tablet  0   No facility-administered medications prior to visit.    PAST MEDICAL HISTORY: Past Medical History  Diagnosis Date  . Migraine headache   . Fibrocystic breast disease   . Esophageal reflux   . Hiatal hernia   . Colonic polyp   . OA (osteoarthritis)  back, hips, knees  . Other and unspecified hyperlipidemia   . Diverticulosis   . Diverticulitis   . Blood in stool   . Basal cell carcinoma     PAST SURGICAL HISTORY: Past Surgical History  Procedure Laterality Date  . Total abdominal hysterectomy w/ bilateral salpingoophorectomy  1981  . Bladder suspension  1986  . Cholecystectomy  2002  . Knee arthroscopy Bilateral 2006, 2013  . Heel spur excision  2001    . Vein ligation Left 1980  . Cataract extraction, bilateral    . Esophagogastroduodenoscopy  9/06, 4/08  . Foot surgery  1990  . Colonoscopy  04/08    FAMILY HISTORY: Family History  Problem Relation Age of Onset  . Hypertension Father   . Heart attack Mother   . Kidney Stones Sister     kidney problems  . Kidney Stones Sister     kidney transplant    SOCIAL HISTORY:  History   Social History  . Marital Status: Married    Spouse Name: Timmothy Sours    Number of Children: 3  . Years of Education: HS   Occupational History  . retired   . RETIRED    Social History Main Topics  . Smoking status: Former Smoker -- 1.00 packs/day for 20 years    Types: Cigarettes    Quit date: 09/23/1980  . Smokeless tobacco: Never Used  . Alcohol Use: Yes     Comment: rarely  . Drug Use: No  . Sexual Activity: Not on file   Other Topics Concern  . Not on file   Social History Narrative   Patient lives at home with spouse.   Caffeine use: 1-2 cups daily     PHYSICAL EXAM  Filed Vitals:   05/16/14 1409  BP: 141/76  Pulse: 66  Temp: 97.3 F (36.3 C)  TempSrc: Oral    Not recorded    Cannot calculate BMI with a height equal to zero.  GENERAL EXAM: Patient is in no distress; well developed, nourished and groomed; neck is supple  CARDIOVASCULAR: Regular rate and rhythm, no murmurs, no carotid bruits  NEUROLOGIC: MENTAL STATUS: awake, alert, oriented to person, place and time, recent and remote memory intact, normal attention and concentration, language fluent, comprehension intact, naming intact, fund of knowledge appropriate CRANIAL NERVE: no papilledema on fundoscopic exam, pupils equal and reactive to light, visual fields full to confrontation, extraocular muscles intact, no nystagmus, facial sensation and strength symmetric, hearing intact, palate elevates symmetrically, uvula midline, shoulder shrug symmetric, tongue midline. MOTOR: normal bulk and tone, full strength in  the BUE, BLE; EXCEPT LEFT LOWER LEG LIMITED DUE TO LEFT LEG CAST SENSORY: normal and symmetric to light touch, pinprick, temperature, vibration  COORDINATION: finger-nose-finger, fine finger movements normal REFLEXES: deep tendon reflexes present and symmetric; EXCEPT LEFT ANKLE NOT CHECKED DUE TO LEFT LEG CAST GAIT/STATION: HAS LEFT LOWER LEG CAST   DIAGNOSTIC DATA (LABS, IMAGING, TESTING) - I reviewed patient records, labs, notes, testing and imaging myself where available.  Lab Results  Component Value Date   WBC 8.3 05/05/2014   HGB 12.5 05/05/2014   HCT 37.1 05/05/2014   MCV 90.0 05/05/2014   PLT 209 05/05/2014      Component Value Date/Time   NA 139 05/05/2014 1548   K 4.0 05/05/2014 1548   CL 103 05/05/2014 1548   CO2 25 05/05/2014 1548   GLUCOSE 116* 05/05/2014 1548   BUN 13 05/05/2014 1548   CREATININE 0.75 05/05/2014 1548  CALCIUM 9.7 05/05/2014 1548   PROT 7.0 05/05/2014 1548   ALBUMIN 3.8 05/05/2014 1548   AST 18 05/05/2014 1548   ALT 15 05/05/2014 1548   ALKPHOS 68 05/05/2014 1548   BILITOT 1.3* 05/05/2014 1548   GFRNONAA 80* 05/05/2014 1548   GFRAA >90 05/05/2014 1548   No results found for this basename: CHOL, HDL, LDLCALC, LDLDIRECT, TRIG, CHOLHDL   No results found for this basename: HGBA1C   No results found for this basename: VITAMINB12   No results found for this basename: TSH    07/11/11 carotid u/s - less than 50% ICA stenosis (bilateral)  08/07/11 TTE - EF 36-62%, grade I diastolic dysfunction  9/47/65 MRI brain (wo) - mild small vessel ischemic disease  11/18/11 MRA head - mild atherosclerosis   ASSESSMENT AND PLAN  79 y.o. year old female here with recurrent lightheaded episodes (pre-syncope) with 1 episode of LOC (syncope); preceded by light head feeling, clammy sensation, palpitations. No convulsions, tongue biting or incontinence or post-ictal confusion. No clinical features to suggest seizure/epilepsy. Also, patient has 20 point drop in systolic BP  from laying to standing today.  Ddx syncope: dehydration, cardiogenic, neurogenic   PLAN: - follow up with PCP for medical/cardiac evaluation of syncope; likely needs another cardiac monitor to capture lightheaded event. Last time when patient had holter monitor, she did not have any clinical events. - increase water/fluid intake (currently 2 coffee and 2 tea per day)  Return in about 3 months (around 08/16/2014).    Penni Bombard, MD 4/65/0354, 6:56 PM Certified in Neurology, Neurophysiology and Neuroimaging  [facility] [address], [place], [place] [address] [phone]

## 2014-05-16 NOTE — Patient Instructions (Signed)
Follow up with PCP (Dr. Kenton Kingfisher).

## 2014-05-19 ENCOUNTER — Encounter: Payer: Self-pay | Admitting: Radiology

## 2014-05-19 ENCOUNTER — Ambulatory Visit (INDEPENDENT_AMBULATORY_CARE_PROVIDER_SITE_OTHER): Payer: Medicare Other | Admitting: Cardiology

## 2014-05-19 ENCOUNTER — Encounter (INDEPENDENT_AMBULATORY_CARE_PROVIDER_SITE_OTHER): Payer: Medicare Other

## 2014-05-19 ENCOUNTER — Encounter: Payer: Self-pay | Admitting: Cardiology

## 2014-05-19 VITALS — BP 138/76 | HR 76 | Ht 64.0 in | Wt 171.0 lb

## 2014-05-19 DIAGNOSIS — R55 Syncope and collapse: Secondary | ICD-10-CM

## 2014-05-19 NOTE — Progress Notes (Signed)
[place], [address] [place],   [address] Phone: [phone] Fax:  [phone]  Date:  05/19/2014   ID:  Jamie Beltran, DOB September 03, 1935, MRN [id number]  PCP:  Shirline Frees, MD   History of Present Illness: Jamie Beltran is a 79 y.o. female here for the evaluation of syncope. She denied episode of syncope resulting in ankle fracture. I saw her previously in November of 2012 for evaluation of lightheadedness. At that time, her symptoms point towards a vagal component prodrome of diaphoresis, clammy, hot, flushed with decreased vision and ringing in ears. Carotid ultrasound was reassuring. Nuclear stress test was reassuring, low risk, echocardiogram was reassuring showing normal ejection fraction with mild diastolic dysfunction, event monitor also reassuring showing no evidence of adverse arrhythmias. Her symptoms have become more frequent but still remains vagal-like in etiology. She's not on any blood pressure medications. She enjoys salt in her food. Her husband has been through several illnesses. He has trouble taking care of her.   Wt Readings from Last 3 Encounters:  05/19/14 171 lb (77.565 kg)     Past Medical History  Diagnosis Date  . Migraine headache   . Fibrocystic breast disease   . Esophageal reflux   . Hiatal hernia   . Colonic polyp   . OA (osteoarthritis)     back, hips, knees  . Other and unspecified hyperlipidemia   . Diverticulosis   . Diverticulitis   . Blood in stool   . Basal cell carcinoma     Past Surgical History  Procedure Laterality Date  . Total abdominal hysterectomy w/ bilateral salpingoophorectomy  1981  . Bladder suspension  1986  . Cholecystectomy  2002  . Knee arthroscopy Bilateral 2006, 2013  . Heel spur excision  2001  . Vein ligation Left 1980  . Cataract extraction, bilateral    . Esophagogastroduodenoscopy  9/06, 4/08  . Foot surgery  1990  . Colonoscopy  04/08    Current Outpatient Prescriptions    Medication Sig Dispense Refill  . aspirin 325 MG tablet Take 325 mg by mouth 2 (two) times daily.      Marland Kitchen HYDROcodone-acetaminophen (NORCO) 10-325 MG per tablet Take 1 tablet by mouth every 6 (six) hours as needed.      Marland Kitchen levothyroxine (SYNTHROID, LEVOTHROID) 50 MCG tablet Take 50 mcg by mouth daily before breakfast.      . pantoprazole (PROTONIX) 40 MG tablet Take 40 mg by mouth daily.       No current facility-administered medications for this visit.    Allergies:    Allergies  Allergen Reactions  . Codeine Other (See Comments)    "Makes me crazy."  . Penicillins Hives and Swelling    Social History:  The patient  reports that she quit smoking about 33 years ago. Her smoking use included Cigarettes. She has a 20 pack-year smoking history. She has never used smokeless tobacco. She reports that she drinks alcohol. She reports that she does not use illicit drugs.   Family History  Problem Relation Age of Onset  . Hypertension Father   . Heart attack Mother   . Kidney Stones Sister     kidney problems  . Kidney Stones Sister     kidney transplant    ROS:  Please see the history of present illness.   Denies any chest pain, fevers, chills, orthopnea, PND   All other systems reviewed and negative.   PHYSICAL EXAM: VS:  BP 138/76  Pulse 76  Ht 5\' 4"  (1.626 m)  Wt 171 lb (77.565 kg)  BMI 29.34 kg/m2 Well nourished, well developed, in no acute distress HEENT: normal, [place]/AT, EOMI Neck: no JVD, normal carotid upstroke, no bruit Cardiac:  normal S1, S2; RRR; no murmur Lungs:  clear to auscultation bilaterally, no wheezing, rhonchi or rales Abd: soft, nontender, no hepatomegaly, no bruits Ext: no edema, 2+ distal pulses left ankle cast Skin: warm and dry GU: deferred Neuro: no focal abnormalities noted, AAO x 3  EKG:  05/05/14-Sinus rhythm, nonspecific ST changes Labs: 05/05/14-Creatinine 0.75, hemoglobin 12.5, glucose 116, troponin normal  ASSESSMENT AND PLAN:  1. Recurrent  syncope-vasovagal-symptoms point towards vasovagal syncope. Classic prodrome. Unfortunately she tried to get up and stand in the midst of a prodrome, fainted, broke her ankle. Same symptoms as she had 3 years ago. Reassuring cardiac workup at that time. Her symptoms are becoming more frequent however. Liberalize salt intake, fluids. I will place an event monitor for 30 days to see if we can catch any adverse arrhythmias. We will contemplate implantable loop recorder if none are detected. We also discussed the possibility of Florinef, fluid expander, if conservative management strategy does not work. She states that in the past, she has done very well when she is sitting down, laying down as the symptoms self terminate. 2. We will followup in 2 months.  Signed, Candee Furbish, MD [facility]  05/19/2014 10:45 AM

## 2014-05-19 NOTE — Progress Notes (Signed)
Patient ID: Jamie Beltran, female   DOB: October 10, 1934, 79 y.o.   MRN: [id number] Lifewatch 30 day monitor applied. EOS 06-18-14

## 2014-05-19 NOTE — Patient Instructions (Addendum)
The current medical regimen is effective;  continue present plan and medications.  Your physician has recommended that you wear an event monitor for 30 days. Event monitors are medical devices that record the heart's electrical activity. Doctors most often [place] these monitors to diagnose arrhythmias. Arrhythmias are problems with the speed or rhythm of the heartbeat. The monitor is a small, portable device. You can wear one while you do your normal daily activities. This is usually used to diagnose what is causing palpitations/syncope (passing out).  Please increase your salt and fluid intake.  Follow up with Dr Marlou Porch after testing in 2 months.

## 2014-05-23 ENCOUNTER — Encounter (HOSPITAL_COMMUNITY): Payer: Self-pay | Admitting: Emergency Medicine

## 2014-05-23 ENCOUNTER — Ambulatory Visit (HOSPITAL_BASED_OUTPATIENT_CLINIC_OR_DEPARTMENT_OTHER)
Admission: RE | Admit: 2014-05-23 | Discharge: 2014-05-23 | Disposition: A | Discharge status: Home / Self Care | Payer: Medicare Other | Source: Ambulatory Visit | Attending: Cardiology | Admitting: Cardiology

## 2014-05-23 ENCOUNTER — Emergency Department (HOSPITAL_COMMUNITY)
Admission: EM | Admit: 2014-05-23 | Discharge: 2014-05-23 | Disposition: A | Discharge status: Home / Self Care | Payer: Medicare Other | Attending: Emergency Medicine | Admitting: Emergency Medicine

## 2014-05-23 ENCOUNTER — Other Ambulatory Visit (HOSPITAL_COMMUNITY): Payer: Self-pay | Admitting: Orthopedic Surgery

## 2014-05-23 DIAGNOSIS — M79609 Pain in unspecified limb: Secondary | ICD-10-CM

## 2014-05-23 DIAGNOSIS — G43909 Migraine, unspecified, not intractable, without status migrainosus: Secondary | ICD-10-CM | POA: Insufficient documentation

## 2014-05-23 DIAGNOSIS — Z880 Allergy status to penicillin: Secondary | ICD-10-CM | POA: Insufficient documentation

## 2014-05-23 DIAGNOSIS — I82402 Acute embolism and thrombosis of unspecified deep veins of left lower extremity: Secondary | ICD-10-CM

## 2014-05-23 DIAGNOSIS — Z8742 Personal history of other diseases of the female genital tract: Secondary | ICD-10-CM | POA: Insufficient documentation

## 2014-05-23 DIAGNOSIS — Z862 Personal history of diseases of the blood and blood-forming organs and certain disorders involving the immune mechanism: Secondary | ICD-10-CM | POA: Insufficient documentation

## 2014-05-23 DIAGNOSIS — Z8639 Personal history of other endocrine, nutritional and metabolic disease: Secondary | ICD-10-CM | POA: Insufficient documentation

## 2014-05-23 DIAGNOSIS — I82409 Acute embolism and thrombosis of unspecified deep veins of unspecified lower extremity: Secondary | ICD-10-CM | POA: Insufficient documentation

## 2014-05-23 DIAGNOSIS — IMO0001 Reserved for inherently not codable concepts without codable children: Secondary | ICD-10-CM

## 2014-05-23 DIAGNOSIS — Z860100 Personal history of colon polyps, unspecified: Secondary | ICD-10-CM | POA: Insufficient documentation

## 2014-05-23 DIAGNOSIS — M7989 Other specified soft tissue disorders: Secondary | ICD-10-CM

## 2014-05-23 DIAGNOSIS — Z79899 Other long term (current) drug therapy: Secondary | ICD-10-CM | POA: Diagnosis not present

## 2014-05-23 DIAGNOSIS — M1990 Unspecified osteoarthritis, unspecified site: Secondary | ICD-10-CM | POA: Diagnosis not present

## 2014-05-23 DIAGNOSIS — Z87891 Personal history of nicotine dependence: Secondary | ICD-10-CM | POA: Insufficient documentation

## 2014-05-23 DIAGNOSIS — K219 Gastro-esophageal reflux disease without esophagitis: Secondary | ICD-10-CM | POA: Diagnosis not present

## 2014-05-23 DIAGNOSIS — Z85828 Personal history of other malignant neoplasm of skin: Secondary | ICD-10-CM | POA: Insufficient documentation

## 2014-05-23 LAB — I-STAT CHEM 8, ED
BUN: 18 mg/dL (ref 6–23)
CALCIUM ION: 1.16 mmol/L (ref 1.13–1.30)
Chloride: 105 mEq/L (ref 96–112)
Creatinine, Ser: 0.7 mg/dL (ref 0.50–1.10)
GLUCOSE: 93 mg/dL (ref 70–99)
HCT: 39 % (ref 36.0–46.0)
Hemoglobin: 13.3 g/dL (ref 12.0–15.0)
Potassium: 4 mEq/L (ref 3.7–5.3)
SODIUM: 136 meq/L — AB (ref 137–147)
TCO2: 26 mmol/L (ref 0–100)

## 2014-05-23 MED ORDER — RIVAROXABAN 15 MG PO TABS
15.0000 mg | ORAL_TABLET | Freq: Once | ORAL | Status: AC
  Administered 2014-05-23: 15 mg via ORAL
  Filled 2014-05-23: qty 1

## 2014-05-23 MED ORDER — RIVAROXABAN 15 MG PO TABS: 15.0000 mg | ORAL_TABLET | Freq: Two times a day (BID) | ORAL | Status: DC

## 2014-05-23 NOTE — ED Notes (Signed)
Patient was at [facility] office today to have left ankle cast removed. Patient had c/o increased pain to the left leg. Patient had a vascular study and was positive for a DVT. Patient also had increased SOB and pain to the mid back today. Patient was told to come to the ED for further evaluation.

## 2014-05-23 NOTE — Discharge Instructions (Signed)
Elevate her left leg about your heart, as much as possible. Use a heating pad on the sore area 2 or 3 times a day. Stop taking the aspirin.   Deep Vein Thrombosis A deep vein thrombosis (DVT) is a blood clot that develops in the deep, larger veins of the leg, arm, or pelvis. These are more dangerous than clots that might form in veins near the surface of the body. A DVT can lead to serious and even life-threatening complications if the clot breaks off and travels in the bloodstream to the lungs.  A DVT can damage the valves in your leg veins so that instead of flowing upward, the blood pools in the lower leg. This is called post-thrombotic syndrome, and it can result in pain, swelling, discoloration, and sores on the leg. CAUSES Usually, several things contribute to the formation of blood clots. Contributing factors include:  The flow of blood slows down.  The inside of the vein is damaged in some way.  You have a condition that makes blood clot more easily. RISK FACTORS Some people are more likely than others to develop blood clots. Risk factors include:   Smoking.  Being overweight (obese).  Sitting or lying still for a long time. This includes long-distance travel, paralysis, or recovery from an illness or surgery. Other factors that increase risk are:   Older age, especially over 19 years of age.  Having a family history of blood clots or if you have already had a blot clot.  Having major or lengthy surgery. This is especially true for surgery on the hip, knee, or belly (abdomen). Hip surgery is particularly high risk.  Having a long, thin tube (catheter) placed inside a vein during a medical procedure.  Breaking a hip or leg.  Having cancer or cancer treatment.  Pregnancy and childbirth.  Hormone changes make the blood clot more easily during pregnancy.  The fetus puts pressure on the veins of the pelvis.  There is a risk of injury to veins during delivery or a  caesarean delivery. The risk is highest just after childbirth.  Medicines containing the female hormone estrogen. This includes birth control pills and hormone replacement therapy.  Other circulation or heart problems.  SIGNS AND SYMPTOMS When a clot forms, it can either partially or totally block the blood flow in that vein. Symptoms of a DVT can include:  Swelling of the leg or arm, especially if one side is much worse.  Warmth and redness of the leg or arm, especially if one side is much worse.  Pain in an arm or leg. If the clot is in the leg, symptoms may be more noticeable or worse when standing or walking. The symptoms of a DVT that has traveled to the lungs (pulmonary embolism, PE) usually start suddenly and include:  Shortness of breath.  Coughing.  Coughing up blood or blood-tinged mucus.  Chest pain. The chest pain is often worse with deep breaths.  Rapid heartbeat. Anyone with these symptoms should get emergency medical treatment right away. Do not wait to see if the symptoms will go away. Call your local emergency services (911 in the [place] if you have these symptoms. Do not drive yourself to the hospital. DIAGNOSIS If a DVT is suspected, your health care provider will take a full medical history and perform a physical exam. Tests that also may be required include:  Blood tests, including studies of the clotting properties of the blood.  Ultrasound to see if you have  clots in your legs or lungs.  X-rays to show the flow of blood when dye is injected into the veins (venogram).  Studies of your lungs if you have any chest symptoms. PREVENTION  Exercise the legs regularly. Take a brisk 30-minute walk every day.  Maintain a weight that is appropriate for your height.  Avoid sitting or lying in bed for long periods of time without moving your legs.  Women, particularly those over the age of 8 years, should consider the risks and benefits of taking estrogen  medicines, including birth control pills.  Do not smoke, especially if you take estrogen medicines.  Long-distance travel can increase your risk of DVT. You should exercise your legs by walking or pumping the muscles every hour.  Many of the risk factors above relate to situations that exist with hospitalization, either for illness, injury, or elective surgery. Prevention may include medical and nonmedical measures.  Your health care provider will assess you for the need for venous thromboembolism prevention when you are admitted to the hospital. If you are having surgery, your surgeon will assess you the day of or day after surgery. TREATMENT Once identified, a DVT can be treated. It can also be prevented in some circumstances. Once you have had a DVT, you may be at increased risk for a DVT in the future. The most common treatment for DVT is blood-thinning (anticoagulant) medicine, which reduces the blood's tendency to clot. Anticoagulants can stop new blood clots from forming and stop old clots from growing. They cannot dissolve existing clots. Your body does this by itself over time. Anticoagulants can be given by mouth, through an IV tube, or by injection. Your health care provider will determine the best program for you. Other medicines or treatments that may be used are:  Heparin or related medicines (low molecular weight heparin) are often the first treatment for a blood clot. They act quickly. However, they cannot be taken orally and must be given either in shot form or by IV tube.  Heparin can cause a fall in a component of blood that stops bleeding and forms blood clots (platelets). You will be monitored with blood tests to be sure this does not occur.  Warfarin is an anticoagulant that can be swallowed. It takes a few days to start working, so usually heparin or related medicines are used in combination. Once warfarin is working, heparin is usually stopped.  Factor Xa inhibitor  medicines, such as rivaroxaban and apixaban, also reduce blood clotting. These medicines are taken orally and can often be used without heparin or related medicines.  Less commonly, clot dissolving drugs (thrombolytics) are used to dissolve a DVT. They carry a high risk of bleeding, so they are used mainly in severe cases where your life or a part of your body is threatened.  Very rarely, a blood clot in the leg needs to be removed surgically.  If you are unable to take anticoagulants, your health care provider may arrange for you to have a filter placed in a main vein in your abdomen. This filter prevents clots from traveling to your lungs. HOME CARE INSTRUCTIONS  Take all medicines as directed by your health care provider.  Learn as much as you can about DVT.  Wear a medical alert bracelet or carry a medical alert card.  Ask your health care provider how soon you can go back to normal activities. It is important to stay active to prevent blood clots. If you are on anticoagulant medicine,  avoid contact sports.  It is very important to exercise. This is especially important while traveling, sitting, or standing for long periods of time. Exercise your legs by walking or by tightening and relaxing your leg muscles regularly. Take frequent walks.  You may need to wear compression stockings. These are tight elastic stockings that apply pressure to the lower legs. This pressure can help keep the blood in the legs from clotting. Taking Warfarin Warfarin is a daily medicine that is taken by mouth. Your health care provider will advise you on the length of treatment (usually 3-6 months, sometimes lifelong). If you take warfarin:  Understand how to take warfarin and foods that can affect how warfarin works in Veterinary surgeon.  Too much and too little warfarin are both dangerous. Too much warfarin increases the risk of bleeding. Too little warfarin continues to allow the risk for blood clots. Warfarin and  Regular Blood Testing While taking warfarin, you will need to have regular blood tests to measure your blood clotting time. These blood tests usually include both the prothrombin time (PT) and international normalized ratio (INR) tests. The PT and INR results allow your health care provider to adjust your dose of warfarin. It is very important that you have your PT and INR tested as often as directed by your health care provider.  Warfarin and Your Diet Avoid major changes in your diet, or notify your health care provider before changing your diet. Arrange a visit with a registered dietitian to answer your questions. Many foods, especially foods high in vitamin K, can interfere with warfarin and affect the PT and INR results. You should eat a consistent amount of foods high in vitamin K. Foods high in vitamin K include:   Spinach, kale, broccoli, cabbage, collard and turnip greens, Brussels sprouts, peas, cauliflower, seaweed, and parsley.  Beef and pork liver.  Green tea.  Soybean oil. Warfarin with Other Medicines Many medicines can interfere with warfarin and affect the PT and INR results. You must:  Tell your health care provider about any and all medicines, vitamins, and supplements you take, including aspirin and other over-the-counter anti-inflammatory medicines. Be especially cautious with aspirin and anti-inflammatory medicines. Ask your health care provider before taking these.  Do not take or discontinue any prescribed or over-the-counter medicine except on the advice of your health care provider or pharmacist. Warfarin Side Effects Warfarin can have side effects, such as easy bruising and difficulty stopping bleeding. Ask your health care provider or pharmacist about other side effects of warfarin. You will need to:  Hold pressure over cuts for longer than usual.  Notify your dentist and other health care providers that you are taking warfarin before you undergo any procedures  where bleeding may occur. Warfarin with Alcohol and Tobacco   Drinking alcohol frequently can increase the effect of warfarin, leading to excess bleeding. It is best to avoid alcoholic drinks or to consume only very small amounts while taking warfarin. Notify your health care provider if you change your alcohol intake.   Do not use any tobacco products including cigarettes, chewing tobacco, or electronic cigarettes. If you smoke, quit. Ask your health care provider for help with quitting smoking. Alternative Medicines to Warfarin: Factor Xa Inhibitor Medicines  These blood-thinning medicines are taken by mouth, usually for several weeks or longer. It is important to take the medicine every single day at the same time each day.  There are no regular blood tests required when using these medicines.  There  are fewer food and drug interactions than with warfarin.  The side effects of this class of medicine are similar to those of warfarin, including excessive bruising or bleeding. Ask your health care provider or pharmacist about other potential side effects. SEEK MEDICAL CARE IF:  You notice a rapid heartbeat.  You feel weaker or more tired than usual.  You feel faint.  You notice increased bruising.  You feel your symptoms are not getting better in the time expected.  You believe you are having side effects of medicine. SEEK IMMEDIATE MEDICAL CARE IF:  You have chest pain.  You have trouble breathing.  You have new or increased swelling or pain in one leg.  You cough up blood.  You notice blood in vomit, in a bowel movement, or in urine. MAKE SURE YOU:  Understand these instructions.  Will watch your condition.  Will get help right away if you are not doing well or get worse. Document Released: 09/09/2005 Document Revised: 01/24/2014 Document Reviewed: 05/17/2013 [facility] Patient Information 2015 [place], [place]. This information is not intended to replace advice given  to you by your health care provider. Make sure you discuss any questions you have with your health care provider.    Information on my medicine - XARELTO (rivaroxaban)  This medication education was reviewed with me or my healthcare representative as part of my discharge preparation.  The pharmacist that spoke with me during my hospital stay was:  Hershal Coria, [place]? Xarelto was prescribed to treat blood clots that may have been found in the veins of your legs (deep vein thrombosis) or in your lungs (pulmonary embolism) and to reduce the risk of them occurring again.  What do you need to know about Xarelto? The starting dose is one 15 mg tablet taken TWICE daily with food for the FIRST 21 DAYS then on (enter date)  06/14/2014  the dose is changed to one 20 mg tablet taken ONCE A DAY with your evening meal.  DO NOT stop taking Xarelto without talking to the health care provider who prescribed the medication.  Refill your prescription for 20 mg tablets before you run out.  After discharge, you should have regular check-up appointments with your healthcare provider that is prescribing your Xarelto.  In the future your dose may need to be changed if your kidney function changes by a significant amount.  What do you do if you miss a dose? If you are taking Xarelto TWICE DAILY and you miss a dose, take it as soon as you remember. You may take two 15 mg tablets (total 30 mg) at the same time then resume your regularly scheduled 15 mg twice daily the next day.  If you are taking Xarelto ONCE DAILY and you miss a dose, take it as soon as you remember on the same day then continue your regularly scheduled once daily regimen the next day. Do not take two doses of Xarelto at the same time.   Important Safety Information Xarelto is a blood thinner medicine that can cause bleeding. You should call your healthcare provider right away if you experience any of the  following:   Bleeding from an injury or your nose that does not stop.   Unusual colored urine (red or dark brown) or unusual colored stools (red or black).   Unusual bruising for unknown reasons.   A serious fall or if you hit your head (even if there is no bleeding).  Some medicines may interact with Xarelto and might increase your risk of bleeding while on Xarelto. To help avoid this, consult your healthcare provider or pharmacist prior to using any new prescription or non-prescription medications, including herbals, vitamins, non-steroidal anti-inflammatory drugs (NSAIDs) and supplements.  This website has more information on Xarelto: [email or web address].[email or web address]

## 2014-05-23 NOTE — ED Provider Notes (Signed)
CSN: [id number]     Arrival date & time 05/23/14  1525 History   First MD Initiated Contact with Patient 05/23/14 1708     Chief Complaint  Patient presents with  . Leg Pain  . Shortness of Breath     (Consider location/radiation/quality/duration/timing/severity/associated sxs/prior Treatment) HPI  Jamie Beltran is a 79 y.o. female who presents for evaluation of known DVT. She saw her orthopedist, today, he removed her cast from her left lower leg, and found it to be swollen. He, subsequently ordered a Doppler study, which showed a left leg DVT. Report is available, in EPIC. The patient noted some left upper back, pain, at about noon today, while she was waiting for the Doppler study. The pain is persistent, and not aggravated by deep eating or movements. She denies shortness of breath, fever, chills, nausea, vomiting, anterior chest pain. No weakness, or dizziness. She has mild pain in the left medial thigh and left lower leg. She's never had a previously. She is currently taking aspirin, 2 each day, as prophylaxis, for DVT. She's taking her usual medications. There are no other known modifying factors.   Past Medical History  Diagnosis Date  . Migraine headache   . Fibrocystic breast disease   . Esophageal reflux   . Hiatal hernia   . Colonic polyp   . OA (osteoarthritis)     back, hips, knees  . Other and unspecified hyperlipidemia   . Diverticulosis   . Diverticulitis   . Blood in stool   . Basal cell carcinoma    Past Surgical History  Procedure Laterality Date  . Total abdominal hysterectomy w/ bilateral salpingoophorectomy  1981  . Bladder suspension  1986  . Cholecystectomy  2002  . Knee arthroscopy Bilateral 2006, 2013  . Heel spur excision  2001  . Vein ligation Left 1980  . Cataract extraction, bilateral    . Esophagogastroduodenoscopy  9/06, 4/08  . Foot surgery  1990  . Colonoscopy  04/08   Family History  Problem Relation Age of Onset  . Hypertension  Father   . Heart attack Mother   . Kidney Stones Sister     kidney problems  . Kidney Stones Sister     kidney transplant   History  Substance Use Topics  . Smoking status: Former Smoker -- 1.00 packs/day for 20 years    Types: Cigarettes    Quit date: 09/23/1980  . Smokeless tobacco: Never Used  . Alcohol Use: Yes     Comment: rarely   OB History   Grav Para Term Preterm Abortions TAB SAB Ect Mult Living                 Review of Systems  All other systems reviewed and are negative.     Allergies  Codeine and Penicillins  Home Medications   Prior to Admission medications   Medication Sig Start Date End Date Taking? Authorizing Provider  gabapentin (NEURONTIN) 300 MG capsule Take 300 mg by mouth 2 (two) times daily.   Yes Historical Provider, MD  HYDROcodone-acetaminophen (NORCO) 10-325 MG per tablet Take 1 tablet by mouth every 6 (six) hours as needed.   Yes Historical Provider, MD  levothyroxine (SYNTHROID, LEVOTHROID) 50 MCG tablet Take 50 mcg by mouth daily before breakfast.   Yes Historical Provider, MD  pantoprazole (PROTONIX) 40 MG tablet Take 40 mg by mouth daily.   Yes Historical Provider, MD  Rivaroxaban (XARELTO) 15 MG TABS tablet Take 1 tablet (15 mg  total) by mouth 2 (two) times daily. 05/23/14   Richarda Blade, MD   BP 133/72  Pulse 75  Temp(Src) 98 F (36.7 C) (Oral)  Resp 16  Ht 5\' 4"  (1.626 m)  Wt 165 lb (74.844 kg)  BMI 28.31 kg/m2  SpO2 98% Physical Exam  Nursing note and vitals reviewed. Constitutional: She is oriented to person, place, and time. She appears well-developed and well-nourished.  HENT:  Head: Normocephalic and atraumatic.  Eyes: Conjunctivae and EOM are normal. Pupils are equal, round, and reactive to light.  Neck: Normal range of motion and phonation normal. Neck supple.  Cardiovascular: Normal rate, regular rhythm and intact distal pulses.   Pulmonary/Chest: Effort normal and breath sounds normal. She exhibits tenderness  (Left lower chest, approximately rib #10, posterior at the posterior axillary line, without associated crepitation or deformity. This tenderness, is mild).  Abdominal: Soft. She exhibits no distension. There is no tenderness. There is no guarding.  Musculoskeletal:  3+ left leg swelling, primarily lower leg. Mild left lower medial thigh tenderness, without mass or cord. Splint and walking shoe on left foot.  Neurological: She is alert and oriented to person, place, and time. She exhibits normal muscle tone.  Skin: Skin is warm and dry.  Psychiatric: She has a normal mood and affect. Her behavior is normal. Judgment and thought content normal.    ED Course  Procedures (including critical care time)  I discussed the findings with patient coming in her life. They are agreeable to outpatient management for venous thromboembolism disease, with Xarelto. They understand that there is a possibility of PE, but the treatment would be the same in this low risk patient, who has only mild symptoms.  Labs Review Labs Reviewed  I-STAT CHEM 8, ED - Abnormal; Notable for the following:    Sodium 136 (*)    All other components within normal limits    Imaging Review No results found.   EKG Interpretation None      MDM   Final diagnoses:  Leg DVT (deep venous thromboembolism), acute, left    DVT, with nonspecific left posterior chest wall pain, which is reproducible, on the examination. Her venous thromboembolism, can be treated with oral agent. She has access to followup care.  Nursing Notes Reviewed/ Care Coordinated Applicable Imaging Reviewed Interpretation of Laboratory Data incorporated into ED treatment  The patient appears reasonably screened and/or stabilized for discharge and I doubt any other medical condition or other [facility] requiring further screening, evaluation, or treatment in the ED at this time prior to discharge.  Plan: Home Medications- xarelto; Home Treatments- rest, leg  elevation; return here if the recommended treatment, does not improve the symptoms; Recommended follow up- PCP followup for DVT treatment in one week   Richarda Blade, MD 05/24/14 0025

## 2014-05-23 NOTE — ED Notes (Signed)
Pharmacist at bedside providing teachings on Xarelto.

## 2014-05-23 NOTE — Progress Notes (Signed)
Left Lower Ext. Venous Duplex Completed. Preliminary results by tech -  Positive for acute DVT in the peroneal veins.  Results were given to Dr. Gladstone Lighter and the patient was told to report to the ER for further evaluation of her DVT and her new onset of upper left sided back/chest pain. Oda Cogan, BS, RDMS, RVT

## 2014-05-25 ENCOUNTER — Telehealth (HOSPITAL_COMMUNITY): Payer: Self-pay | Admitting: *Deleted

## 2014-05-25 ENCOUNTER — Other Ambulatory Visit: Payer: Self-pay | Admitting: *Deleted

## 2014-05-26 ENCOUNTER — Ambulatory Visit (HOSPITAL_COMMUNITY)
Admission: RE | Admit: 2014-05-26 | Discharge: 2014-05-26 | Disposition: A | Discharge status: Home / Self Care | Payer: Medicare Other | Source: Ambulatory Visit | Attending: Family Medicine | Admitting: Family Medicine

## 2014-05-26 ENCOUNTER — Other Ambulatory Visit (HOSPITAL_COMMUNITY): Payer: Self-pay | Admitting: Family Medicine

## 2014-05-26 ENCOUNTER — Encounter (HOSPITAL_COMMUNITY): Payer: Self-pay

## 2014-05-26 DIAGNOSIS — I82409 Acute embolism and thrombosis of unspecified deep veins of unspecified lower extremity: Secondary | ICD-10-CM

## 2014-05-26 DIAGNOSIS — R0602 Shortness of breath: Secondary | ICD-10-CM | POA: Insufficient documentation

## 2014-05-26 MED ORDER — IOHEXOL 350 MG/ML SOLN
100.0000 mL | Freq: Once | INTRAVENOUS | Status: AC | PRN
  Administered 2014-05-26: 100 mL via INTRAVENOUS

## 2014-05-27 ENCOUNTER — Telehealth (HOSPITAL_COMMUNITY): Payer: Self-pay | Admitting: *Deleted

## 2014-06-13 ENCOUNTER — Encounter (HOSPITAL_COMMUNITY): Payer: Self-pay | Admitting: Emergency Medicine

## 2014-06-13 ENCOUNTER — Emergency Department (HOSPITAL_COMMUNITY)
Admission: EM | Admit: 2014-06-13 | Discharge: 2014-06-13 | Disposition: A | Discharge status: Home / Self Care | Payer: Medicare Other | Attending: Emergency Medicine | Admitting: Emergency Medicine

## 2014-06-13 DIAGNOSIS — I82409 Acute embolism and thrombosis of unspecified deep veins of unspecified lower extremity: Secondary | ICD-10-CM | POA: Diagnosis not present

## 2014-06-13 DIAGNOSIS — K219 Gastro-esophageal reflux disease without esophagitis: Secondary | ICD-10-CM | POA: Diagnosis not present

## 2014-06-13 DIAGNOSIS — I82402 Acute embolism and thrombosis of unspecified deep veins of left lower extremity: Secondary | ICD-10-CM

## 2014-06-13 DIAGNOSIS — Z7901 Long term (current) use of anticoagulants: Secondary | ICD-10-CM | POA: Insufficient documentation

## 2014-06-13 DIAGNOSIS — Z79899 Other long term (current) drug therapy: Secondary | ICD-10-CM | POA: Insufficient documentation

## 2014-06-13 DIAGNOSIS — Z8639 Personal history of other endocrine, nutritional and metabolic disease: Secondary | ICD-10-CM | POA: Insufficient documentation

## 2014-06-13 DIAGNOSIS — G43909 Migraine, unspecified, not intractable, without status migrainosus: Secondary | ICD-10-CM | POA: Diagnosis not present

## 2014-06-13 DIAGNOSIS — Z87891 Personal history of nicotine dependence: Secondary | ICD-10-CM | POA: Diagnosis not present

## 2014-06-13 DIAGNOSIS — Z8742 Personal history of other diseases of the female genital tract: Secondary | ICD-10-CM | POA: Diagnosis not present

## 2014-06-13 DIAGNOSIS — M1990 Unspecified osteoarthritis, unspecified site: Secondary | ICD-10-CM | POA: Insufficient documentation

## 2014-06-13 DIAGNOSIS — Z85828 Personal history of other malignant neoplasm of skin: Secondary | ICD-10-CM | POA: Insufficient documentation

## 2014-06-13 DIAGNOSIS — Z862 Personal history of diseases of the blood and blood-forming organs and certain disorders involving the immune mechanism: Secondary | ICD-10-CM | POA: Insufficient documentation

## 2014-06-13 DIAGNOSIS — K625 Hemorrhage of anus and rectum: Secondary | ICD-10-CM

## 2014-06-13 DIAGNOSIS — Z860100 Personal history of colon polyps, unspecified: Secondary | ICD-10-CM | POA: Insufficient documentation

## 2014-06-13 DIAGNOSIS — Z880 Allergy status to penicillin: Secondary | ICD-10-CM | POA: Insufficient documentation

## 2014-06-13 DIAGNOSIS — I803 Phlebitis and thrombophlebitis of lower extremities, unspecified: Secondary | ICD-10-CM

## 2014-06-13 LAB — CBC WITH DIFFERENTIAL/PLATELET
Basophils Absolute: 0 10*3/uL (ref 0.0–0.1)
Basophils Relative: 0 % (ref 0–1)
Eosinophils Absolute: 0.1 10*3/uL (ref 0.0–0.7)
Eosinophils Relative: 2 % (ref 0–5)
HCT: 37.4 % (ref 36.0–46.0)
Hemoglobin: 12.3 g/dL (ref 12.0–15.0)
LYMPHS ABS: 1.8 10*3/uL (ref 0.7–4.0)
Lymphocytes Relative: 27 % (ref 12–46)
MCH: 29.7 pg (ref 26.0–34.0)
MCHC: 32.9 g/dL (ref 30.0–36.0)
MCV: 90.3 fL (ref 78.0–100.0)
Monocytes Absolute: 0.7 10*3/uL (ref 0.1–1.0)
Monocytes Relative: 10 % (ref 3–12)
Neutro Abs: 4.1 10*3/uL (ref 1.7–7.7)
Neutrophils Relative %: 61 % (ref 43–77)
PLATELETS: 218 10*3/uL (ref 150–400)
RBC: 4.14 MIL/uL (ref 3.87–5.11)
RDW: 13.3 % (ref 11.5–15.5)
WBC: 6.8 10*3/uL (ref 4.0–10.5)

## 2014-06-13 LAB — URINALYSIS, ROUTINE W REFLEX MICROSCOPIC
Bilirubin Urine: NEGATIVE
Glucose, UA: NEGATIVE mg/dL
Ketones, ur: NEGATIVE mg/dL
LEUKOCYTES UA: NEGATIVE
NITRITE: NEGATIVE
PROTEIN: NEGATIVE mg/dL
Specific Gravity, Urine: 1.013 (ref 1.005–1.030)
UROBILINOGEN UA: 0.2 mg/dL (ref 0.0–1.0)
pH: 6.5 (ref 5.0–8.0)

## 2014-06-13 LAB — APTT: aPTT: 35 seconds (ref 24–37)

## 2014-06-13 LAB — TYPE AND SCREEN
ABO/RH(D): O POS
Antibody Screen: NEGATIVE

## 2014-06-13 LAB — COMPREHENSIVE METABOLIC PANEL
ALT: 15 U/L (ref 0–35)
ANION GAP: 11 (ref 5–15)
AST: 16 U/L (ref 0–37)
Albumin: 3.8 g/dL (ref 3.5–5.2)
Alkaline Phosphatase: 74 U/L (ref 39–117)
BUN: 15 mg/dL (ref 6–23)
CALCIUM: 9.6 mg/dL (ref 8.4–10.5)
CO2: 24 mEq/L (ref 19–32)
Chloride: 105 mEq/L (ref 96–112)
Creatinine, Ser: 0.64 mg/dL (ref 0.50–1.10)
GFR calc non Af Amer: 85 mL/min — ABNORMAL LOW (ref >90)
GLUCOSE: 101 mg/dL — AB (ref 70–99)
Potassium: 3.9 mEq/L (ref 3.7–5.3)
SODIUM: 140 meq/L (ref 137–147)
Total Bilirubin: 1.1 mg/dL (ref 0.3–1.2)
Total Protein: 7.2 g/dL (ref 6.0–8.3)

## 2014-06-13 LAB — URINE MICROSCOPIC-ADD ON

## 2014-06-13 LAB — POC OCCULT BLOOD, ED: Fecal Occult Bld: POSITIVE — AB

## 2014-06-13 LAB — ABO/RH: ABO/RH(D): O POS

## 2014-06-13 LAB — LIPASE, BLOOD: LIPASE: 27 U/L (ref 11–59)

## 2014-06-13 LAB — PROTIME-INR
INR: 1.49 (ref 0.00–1.49)
PROTHROMBIN TIME: 18 s — AB (ref 11.6–15.2)

## 2014-06-13 MED ORDER — INFLUENZA VAC SPLIT QUAD 0.5 ML IM SUSY
0.5000 mL | PREFILLED_SYRINGE | INTRAMUSCULAR | Status: DC
  Filled 2014-06-13: qty 0.5

## 2014-06-13 NOTE — ED Notes (Signed)
Pt up to restroom to obtain urine specimen. pt dropped urine specimen in toilet. Will obtain another specimen pwhen pt can void again

## 2014-06-13 NOTE — ED Notes (Signed)
Pt reports on xarelto and reports bright red blood from rectum last night. Pt reports may have lost a tablespoon of blood. Pt denies bleeding since and normal stool this am. Pt denies pain. Pt has rash to left buttock. Pt reports rash for 4 days.

## 2014-06-13 NOTE — Discharge Instructions (Signed)
Rectal Bleeding °Rectal bleeding is when blood passes out of the anus. It is usually a sign that something is wrong. It may not be serious, but it should always be evaluated. Rectal bleeding may present as bright red blood or extremely dark stools. The color may range from dark red or maroon to black (like tar). It is important that the cause of rectal bleeding be identified so treatment can be started and the problem corrected. °CAUSES  °· Hemorrhoids. These are enlarged (dilated) blood vessels or veins in the anal or rectal area. °· Fistulas. These are abnormal, burrowing channels that usually run from inside the rectum to the skin around the anus. They can bleed. °· Anal fissures. This is a tear in the tissue of the anus. Bleeding occurs with bowel movements. °· Diverticulosis. This is a condition in which pockets or sacs project from the bowel wall. Occasionally, the sacs can bleed. °· Diverticulitis. This is an infection involving diverticulosis of the colon. °· Proctitis and colitis. These are conditions in which the rectum, colon, or both, can become inflamed and pitted (ulcerated). °· Polyps and cancer. Polyps are non-cancerous (benign) growths in the colon that may bleed. Certain types of polyps turn into cancer. °· Protrusion of the rectum. Part of the rectum can project from the anus and bleed. °· Certain medicines. °· Intestinal infections. °· Blood vessel abnormalities. °HOME CARE INSTRUCTIONS °· Eat a high-fiber diet to keep your stool soft. °· Limit activity. °· Drink enough fluids to keep your urine clear or pale yellow. °· Warm baths may be useful to soothe rectal pain. °· Follow up with your caregiver as directed. °SEEK IMMEDIATE MEDICAL CARE IF: °· You develop increased bleeding. °· You have black or dark red stools. °· You vomit blood or material that looks like coffee grounds. °· You have abdominal pain or tenderness. °· You have a fever. °· You feel weak, nauseous, or you faint. °· You have  severe rectal pain or you are unable to have a bowel movement. °MAKE SURE YOU: °· Understand these instructions. °· Will watch your condition. °· Will get help right away if you are not doing well or get worse. °Document Released: 03/01/2002 Document Revised: 12/02/2011 Document Reviewed: 02/24/2011 °ExitCare® Patient Information ©2015 [employer]. This information is not intended to replace advice given to you by your health care provider. Make sure you discuss any questions you have with your health care provider. ° °

## 2014-06-13 NOTE — ED Notes (Signed)
Vascular at bedside

## 2014-06-13 NOTE — ED Notes (Signed)
Pt states she had a bright red stool last night, pt states she woke up with a headache this morning. Pt is on Xarelto. Pt denies lower bad pain at the moment but there was some whe going to restroom. Pt states she was nauseated as well.

## 2014-06-13 NOTE — ED Provider Notes (Signed)
CSN: [id number]     Arrival date & time 06/13/14  1036 History   First MD Initiated Contact with Patient 06/13/14 1147     Chief Complaint  Patient presents with  . Rectal Bleeding  . Headache  . Nausea     (Consider location/radiation/quality/duration/timing/severity/associated sxs/prior Treatment) HPI The patient reports yesterday evening at 8 PM she had a bowel movement and saw a large amount of bright red blood. She reports later that evening she went into the bathroom again and did not note any bleeding present. The patient reports 2 episodes of going to the bathroom this morning again passing some stool but no blood noted. The patient has no history of GI bleed. She does report she uses a suppository sometimes for constipation issues using hydrocodone for pain. She's had no vomiting no abdominal pain. She does report she expressed a small amount of rectal pain. This apparently is not ongoing. She reports this morning she felt cold nauseated but did not gone to vomit and it has since resolved. The patient came to the emergency department for concerns about bleeding on Xarelto. Past Medical History  Diagnosis Date  . Migraine headache   . Fibrocystic breast disease   . Esophageal reflux   . Hiatal hernia   . Colonic polyp   . OA (osteoarthritis)     back, hips, knees  . Other and unspecified hyperlipidemia   . Diverticulosis   . Diverticulitis   . Blood in stool   . Basal cell carcinoma    Past Surgical History  Procedure Laterality Date  . Total abdominal hysterectomy w/ bilateral salpingoophorectomy  1981  . Bladder suspension  1986  . Cholecystectomy  2002  . Knee arthroscopy Bilateral 2006, 2013  . Heel spur excision  2001  . Vein ligation Left 1980  . Cataract extraction, bilateral    . Esophagogastroduodenoscopy  9/06, 4/08  . Foot surgery  1990  . Colonoscopy  04/08   Family History  Problem Relation Age of Onset  . Hypertension Father   . Heart attack Mother    . Kidney Stones Sister     kidney problems  . Kidney Stones Sister     kidney transplant   History  Substance Use Topics  . Smoking status: Former Smoker -- 1.00 packs/day for 20 years    Types: Cigarettes    Quit date: 09/23/1980  . Smokeless tobacco: Never Used  . Alcohol Use: Yes     Comment: rarely   OB History   Grav Para Term Preterm Abortions TAB SAB Ect Mult Living                 Review of Systems 10 Systems reviewed and are negative for acute change except as noted in the HPI.    Allergies  Codeine and Penicillins  Home Medications   Prior to Admission medications   Medication Sig Start Date End Date Taking? Authorizing Provider  HYDROcodone-acetaminophen (NORCO) 10-325 MG per tablet Take 1 tablet by mouth every 6 (six) hours as needed for moderate pain.    Yes Historical Provider, MD  levothyroxine (SYNTHROID, LEVOTHROID) 50 MCG tablet Take 50 mcg by mouth daily before breakfast.   Yes Historical Provider, MD  pantoprazole (PROTONIX) 40 MG tablet Take 40 mg by mouth daily.   Yes Historical Provider, MD  Rivaroxaban (XARELTO) 15 MG TABS tablet Take 1 tablet (15 mg total) by mouth 2 (two) times daily. 05/23/14  Yes Richarda Blade, MD  gabapentin (NEURONTIN)  300 MG capsule Take 300 mg by mouth 2 (two) times daily.    Historical Provider, MD   BP 152/67  Pulse 74  Temp(Src) 97.9 F (36.6 C) (Oral)  Resp 16  SpO2 97% Physical Exam  Constitutional: She is oriented to person, place, and time. She appears well-developed and well-nourished.  HENT:  Head: Normocephalic and atraumatic.  Eyes: EOM are normal. Pupils are equal, round, and reactive to light.  Neck: Neck supple.  Cardiovascular: Normal rate, regular rhythm, normal heart sounds and intact distal pulses.   Pulmonary/Chest: Effort normal and breath sounds normal.  Abdominal: Soft. Bowel sounds are normal. She exhibits no distension. There is no tenderness.  Musculoskeletal: Normal range of motion. She  exhibits no edema.  Neurological: She is alert and oriented to person, place, and time. She has normal strength. Coordination normal. GCS eye subscore is 4. GCS verbal subscore is 5. GCS motor subscore is 6.  Skin: Skin is warm, dry and intact.  Psychiatric: She has a normal mood and affect.   Rectal examination: The anus is grossly normal to visual inspection with small nonthrombosed hemorrhoids. Digital examination does not reveal any masses or polyps within the rectal vault. The stool was soft and brownish yellow. Hemoccult testing was done in the lab the report was for trace positive. ED Course  Procedures (including critical care time) Labs Review Labs Reviewed  COMPREHENSIVE METABOLIC PANEL - Abnormal; Notable for the following:    Glucose, Bld 101 (*)    GFR calc non Af Amer 85 (*)    All other components within normal limits  URINALYSIS, ROUTINE W REFLEX MICROSCOPIC - Abnormal; Notable for the following:    Hgb urine dipstick MODERATE (*)    All other components within normal limits  PROTIME-INR - Abnormal; Notable for the following:    Prothrombin Time 18.0 (*)    All other components within normal limits  POC OCCULT BLOOD, ED - Abnormal; Notable for the following:    Fecal Occult Bld POSITIVE (*)    All other components within normal limits  CBC WITH DIFFERENTIAL  LIPASE, BLOOD  APTT  URINE MICROSCOPIC-ADD ON  TYPE AND SCREEN  ABO/RH    Imaging Review No results found.   EKG Interpretation None     Consult: Dr. Shirline Frees. I reviewed the case with patient's family physician. At this point in time he will monitor her blood counts as an outpatient and follow her up in the office for any evidence of continued bleeding. MDM   Final diagnoses:  Rectal bleeding  DVT (deep venous thrombosis), left   The patient presents on above. The bleeding does sound like it was rectal or peri-anal and self-limited. The rectal exam but I did showed normal brown yellow stool. The  patient's DVT does persist and at this point in time we will continue the Xarelto with precautions to return if any bleeding in close outpatient monitoring for any evidence of ongoing bleeding. Her condition is otherwise stable in general appearance is good.     Charlesetta Shanks, MD 06/13/14 [phone]

## 2014-06-13 NOTE — Progress Notes (Signed)
VASCULAR LAB PRELIMINARY  PRELIMINARY  PRELIMINARY  PRELIMINARY  Left lower extremity venous duplex completed.    Preliminary report:  Left:  DVT remains in the peroneal vein.  No significant change noted from previous study.  No propagation noted.    Moe Brier, RVT 06/13/2014, 3:05 PM

## 2014-07-14 ENCOUNTER — Encounter: Payer: Self-pay | Admitting: Cardiology

## 2014-07-14 ENCOUNTER — Ambulatory Visit (INDEPENDENT_AMBULATORY_CARE_PROVIDER_SITE_OTHER): Payer: Medicare Other | Admitting: Cardiology

## 2014-07-14 VITALS — BP 116/66 | HR 78 | Ht 64.0 in | Wt 166.0 lb

## 2014-07-14 DIAGNOSIS — M549 Dorsalgia, unspecified: Secondary | ICD-10-CM | POA: Insufficient documentation

## 2014-07-14 DIAGNOSIS — M545 Low back pain, unspecified: Secondary | ICD-10-CM

## 2014-07-14 DIAGNOSIS — I82409 Acute embolism and thrombosis of unspecified deep veins of unspecified lower extremity: Secondary | ICD-10-CM | POA: Insufficient documentation

## 2014-07-14 DIAGNOSIS — I82402 Acute embolism and thrombosis of unspecified deep veins of left lower extremity: Secondary | ICD-10-CM

## 2014-07-14 NOTE — Progress Notes (Signed)
[place]. [address], [place]  [address] Phone: [phone] Fax:  [phone]  Date:  07/14/2014   ID:  Jamie Beltran, DOB 04-Dec-1934, MRN [id number]  PCP:  Shirline Frees, MD   History of Present Illness: Jamie Beltran is a 79 y.o. female here for the follow up of syncope. She had episode of syncope resulting in ankle fracture , had classic vasovagal prodrome, got up in the midst of one and fell. Same type of symptoms 3 years ago with reassuring cardiac workup at that time. During this most recent episode in August of 2015, I placed an event monitor which was unremarkable, no pauses, no significant bradycardia no atrial fibrillation.  I saw her previously in November of 2012 for evaluation of lightheadedness. At that time, her symptoms point towards a vagal component prodrome of diaphoresis, clammy, hot, flushed with decreased vision and ringing in ears. Carotid ultrasound was reassuring. Nuclear stress test was reassuring, low risk, echocardiogram was reassuring showing normal ejection fraction with mild diastolic dysfunction, event monitor also reassuring showing no evidence of adverse arrhythmias. Her symptoms have become more frequent but still remains vagal-like in etiology. She's not on any blood pressure medications. She enjoys salt in her food.  Since her last visit, she is feeling better. She was diagnosed with DVT of left lower extremity for her ankle fracture. She is currently on Xarelto for blood thinner. She has not had another syncopal episodes. She reminded me that her blood pressure usually is very low. She is prone to vasovagal reaction.  Wt Readings from Last 3 Encounters:  07/14/14 166 lb (75.297 kg)  05/23/14 165 lb (74.844 kg)  05/19/14 171 lb (77.565 kg)     Past Medical History  Diagnosis Date  . Migraine headache   . Fibrocystic breast disease   . Esophageal reflux   . Hiatal hernia   . Colonic polyp   . OA (osteoarthritis)    back, hips, knees  . Other and unspecified hyperlipidemia   . Diverticulosis   . Diverticulitis   . Blood in stool   . Basal cell carcinoma     Past Surgical History  Procedure Laterality Date  . Total abdominal hysterectomy w/ bilateral salpingoophorectomy  1981  . Bladder suspension  1986  . Cholecystectomy  2002  . Knee arthroscopy Bilateral 2006, 2013  . Heel spur excision  2001  . Vein ligation Left 1980  . Cataract extraction, bilateral    . Esophagogastroduodenoscopy  9/06, 4/08  . Foot surgery  1990  . Colonoscopy  04/08    Current Outpatient Prescriptions  Medication Sig Dispense Refill  . HYDROcodone-acetaminophen (NORCO) 10-325 MG per tablet Take 1 tablet by mouth every 6 (six) hours as needed for moderate pain.       Marland Kitchen levothyroxine (SYNTHROID, LEVOTHROID) 50 MCG tablet Take 50 mcg by mouth daily before breakfast.      . pantoprazole (PROTONIX) 40 MG tablet Take 40 mg by mouth 2 (two) times daily.       . rivaroxaban (XARELTO) 20 MG TABS tablet Take 20 mg by mouth daily with supper.      . gabapentin (NEURONTIN) 300 MG capsule        No current facility-administered medications for this visit.    Allergies:    Allergies  Allergen Reactions  . Codeine Other (See Comments)    "Makes me crazy."  . Penicillins Hives and Swelling    Social History:  The patient  reports that she quit smoking about 33 years ago. Her smoking use included Cigarettes. She has a 20 pack-year smoking history. She has never used smokeless tobacco. She reports that she drinks alcohol. She reports that she does not use illicit drugs.   Family History  Problem Relation Age of Onset  . Hypertension Father   . Heart attack Mother   . Kidney Stones Sister     kidney problems  . Kidney Stones Sister     kidney transplant    ROS:  Please see the history of present illness.  + Back pain, cane, (Dr. Carloyn Manner) Denies any chest pain, fevers, chills, orthopnea, PND   All other systems reviewed and  negative.   PHYSICAL EXAM: VS:  BP 116/66  Pulse 78  Ht 5\' 4"  (1.626 m)  Wt 166 lb (75.297 kg)  BMI 28.48 kg/m2 Well nourished, well developed, in no acute distress HEENT: normal, [place]/AT, EOMI Neck: no JVD, normal carotid upstroke, no bruit Cardiac:  normal S1, S2; RRR; no murmur Lungs:  clear to auscultation bilaterally, no wheezing, rhonchi or rales Abd: soft, nontender, no hepatomegaly, no bruits Ext: Left ankle  edema, 2+ distal pulses left ankle cast Skin: warm and dry GU: deferred Neuro: no focal abnormalities noted, AAO x 3  EKG:  05/05/14-Sinus rhythm, nonspecific ST changes Labs: 05/05/14-Creatinine 0.75, hemoglobin 12.5, glucose 116, troponin normal  ASSESSMENT AND PLAN:  1. Recurrent syncope-vasovagal-symptoms point towards vasovagal syncope. Classic prodrome. Unfortunately she tried to get up and stand in the midst of a prodrome, fainted, broke her ankle. She also unfortunately developed a left lower extremity DVT. She is currently on Xarelto. (44mth total). Syncope was same symptoms as she had 3 years ago. Reassuring cardiac workup at that time. Her symptoms are becoming more frequent however. Liberalize salt intake, fluids. Event monitor reassuring, no pauses, no significant bradycardia. No atrial fibrillation detected. We will contemplate implantable loop recorder in future if this occurs again. We also discussed the possibility of Florinef, fluid expander, but she is currently feeling well. She states that in the past, she has done very well when she is sitting down, laying down as the symptoms self terminate. 2. Back pain-Dr. Carloyn Manner. Waiting until anticoagulation is finished before back surgery. 3. We will followup in 6 months.  Signed, Candee Furbish, MD [facility]  07/14/2014 10:43 AM

## 2014-07-14 NOTE — Patient Instructions (Signed)
The current medical regimen is effective;  continue present plan and medications.  Follow up in 6 months with Dr. Skains.  You will receive a letter in the mail 2 months before you are due.  Please call us when you receive this letter to schedule your follow up appointment.  

## 2014-07-18 ENCOUNTER — Ambulatory Visit: Payer: Medicare Other | Admitting: Cardiology

## 2014-10-18 ENCOUNTER — Other Ambulatory Visit: Payer: Self-pay | Admitting: Family Medicine

## 2014-10-19 ENCOUNTER — Other Ambulatory Visit (HOSPITAL_COMMUNITY): Payer: Self-pay | Admitting: Cardiology

## 2014-10-20 ENCOUNTER — Other Ambulatory Visit (HOSPITAL_COMMUNITY): Payer: Self-pay | Admitting: Cardiology

## 2014-10-20 DIAGNOSIS — I82402 Acute embolism and thrombosis of unspecified deep veins of left lower extremity: Secondary | ICD-10-CM

## 2014-10-21 ENCOUNTER — Ambulatory Visit (HOSPITAL_COMMUNITY): Payer: Medicare Other | Attending: Internal Medicine | Admitting: Cardiology

## 2014-10-21 DIAGNOSIS — I82409 Acute embolism and thrombosis of unspecified deep veins of unspecified lower extremity: Secondary | ICD-10-CM | POA: Insufficient documentation

## 2014-10-21 DIAGNOSIS — Z87891 Personal history of nicotine dependence: Secondary | ICD-10-CM | POA: Insufficient documentation

## 2014-10-21 DIAGNOSIS — E785 Hyperlipidemia, unspecified: Secondary | ICD-10-CM | POA: Insufficient documentation

## 2014-10-21 DIAGNOSIS — I82402 Acute embolism and thrombosis of unspecified deep veins of left lower extremity: Secondary | ICD-10-CM

## 2014-10-21 DIAGNOSIS — Z86718 Personal history of other venous thrombosis and embolism: Secondary | ICD-10-CM

## 2014-10-21 NOTE — Progress Notes (Signed)
Lt LE Venous duplex performed 

## 2014-11-10 ENCOUNTER — Other Ambulatory Visit: Payer: Self-pay | Admitting: Neurosurgery

## 2014-11-10 DIAGNOSIS — M47816 Spondylosis without myelopathy or radiculopathy, lumbar region: Secondary | ICD-10-CM

## 2014-11-11 ENCOUNTER — Other Ambulatory Visit: Payer: Self-pay | Admitting: Neurosurgery

## 2014-11-11 DIAGNOSIS — M47816 Spondylosis without myelopathy or radiculopathy, lumbar region: Secondary | ICD-10-CM

## 2014-11-15 ENCOUNTER — Other Ambulatory Visit: Payer: Self-pay | Admitting: Family Medicine

## 2014-11-15 DIAGNOSIS — R911 Solitary pulmonary nodule: Secondary | ICD-10-CM

## 2014-11-23 ENCOUNTER — Ambulatory Visit: Payer: Medicare Other

## 2014-11-23 ENCOUNTER — Ambulatory Visit
Admission: RE | Admit: 2014-11-23 | Discharge: 2014-11-23 | Disposition: A | Discharge status: Home / Self Care | Payer: Medicare Other | Source: Ambulatory Visit | Attending: Neurosurgery | Admitting: Neurosurgery

## 2014-11-23 ENCOUNTER — Ambulatory Visit
Admission: RE | Admit: 2014-11-23 | Discharge: 2014-11-23 | Disposition: A | Discharge status: Home / Self Care | Payer: Medicare Other | Source: Ambulatory Visit | Attending: Family Medicine | Admitting: Family Medicine

## 2014-11-23 DIAGNOSIS — M47816 Spondylosis without myelopathy or radiculopathy, lumbar region: Secondary | ICD-10-CM

## 2014-11-23 DIAGNOSIS — R911 Solitary pulmonary nodule: Secondary | ICD-10-CM

## 2015-07-25 ENCOUNTER — Other Ambulatory Visit: Payer: Self-pay | Admitting: Family Medicine

## 2015-07-25 DIAGNOSIS — K6289 Other specified diseases of anus and rectum: Secondary | ICD-10-CM

## 2015-07-25 DIAGNOSIS — R109 Unspecified abdominal pain: Secondary | ICD-10-CM

## 2015-07-26 ENCOUNTER — Ambulatory Visit
Admission: RE | Admit: 2015-07-26 | Discharge: 2015-07-26 | Disposition: A | Discharge status: Home / Self Care | Payer: Medicare Other | Source: Ambulatory Visit | Attending: Family Medicine | Admitting: Family Medicine

## 2015-07-26 DIAGNOSIS — K6289 Other specified diseases of anus and rectum: Secondary | ICD-10-CM

## 2015-07-26 DIAGNOSIS — R109 Unspecified abdominal pain: Secondary | ICD-10-CM

## 2015-07-26 MED ORDER — IOPAMIDOL (ISOVUE-300) INJECTION 61%
100.0000 mL | Freq: Once | INTRAVENOUS | Status: AC | PRN
  Administered 2015-07-26: 100 mL via INTRAVENOUS

## 2015-11-17 ENCOUNTER — Other Ambulatory Visit: Payer: Self-pay | Admitting: Family Medicine

## 2015-11-17 DIAGNOSIS — R911 Solitary pulmonary nodule: Secondary | ICD-10-CM

## 2015-12-04 ENCOUNTER — Ambulatory Visit
Admission: RE | Admit: 2015-12-04 | Discharge: 2015-12-04 | Disposition: A | Discharge status: Home / Self Care | Payer: Medicare Other | Source: Ambulatory Visit | Attending: Family Medicine | Admitting: Family Medicine

## 2015-12-04 DIAGNOSIS — R911 Solitary pulmonary nodule: Secondary | ICD-10-CM

## 2016-01-12 ENCOUNTER — Other Ambulatory Visit: Payer: Self-pay | Admitting: Neurosurgery

## 2016-01-12 DIAGNOSIS — M4319 Spondylolisthesis, multiple sites in spine: Secondary | ICD-10-CM

## 2016-01-15 ENCOUNTER — Ambulatory Visit
Admission: RE | Admit: 2016-01-15 | Discharge: 2016-01-15 | Disposition: A | Discharge status: Home / Self Care | Payer: Medicare Other | Source: Ambulatory Visit | Attending: Neurosurgery | Admitting: Neurosurgery

## 2016-01-15 DIAGNOSIS — M4319 Spondylolisthesis, multiple sites in spine: Secondary | ICD-10-CM

## 2016-02-04 IMAGING — CT CT ANGIO CHEST
2 of 7 series · 19 of 36 positions shown · IV contrast (OMNIPAQUE)
Comparison: None.

CLINICAL DATA: Shortness of breath.

EXAM:
CT ANGIOGRAPHY CHEST WITH CONTRAST
TECHNIQUE: Multidetector CT imaging of the chest was performed using the
standard protocol during bolus administration of intravenous
contrast. Multiplanar CT image reconstructions and MIPs were
obtained to evaluate the vascular anatomy.
CONTRAST:  100mL OMNIPAQUE IOHEXOL 350 MG/ML SOLN

[Series 6: pe thins @ 1mm · axial · 0.57mm/px · z∈[-228,-20]mm · 18 of 235 slices shown]
[im 13/235  lung]
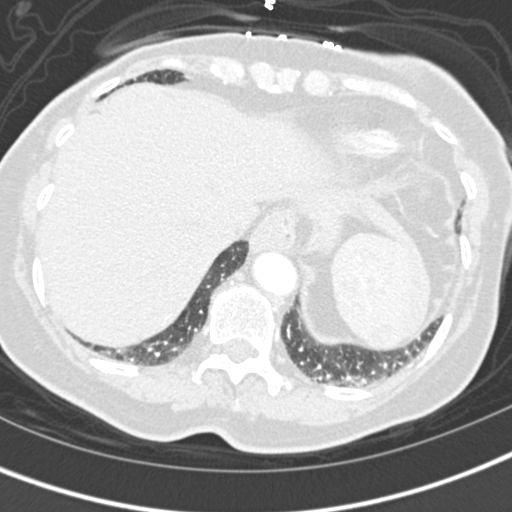
[im 25/235  mediastinal]
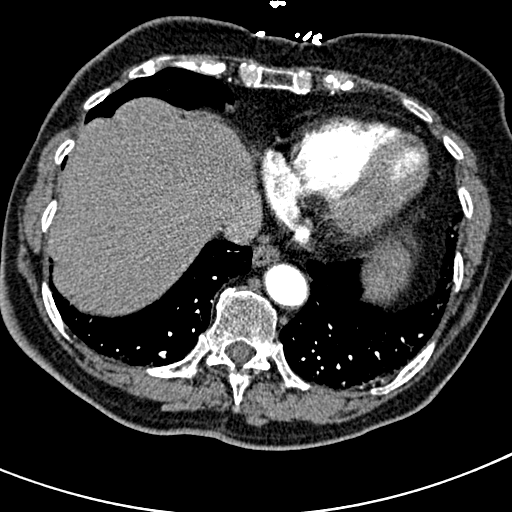
[im 37/235  lung]
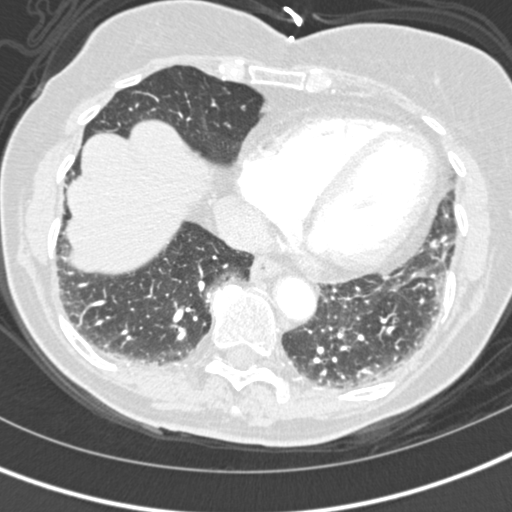
[im 50/235  mediastinal]
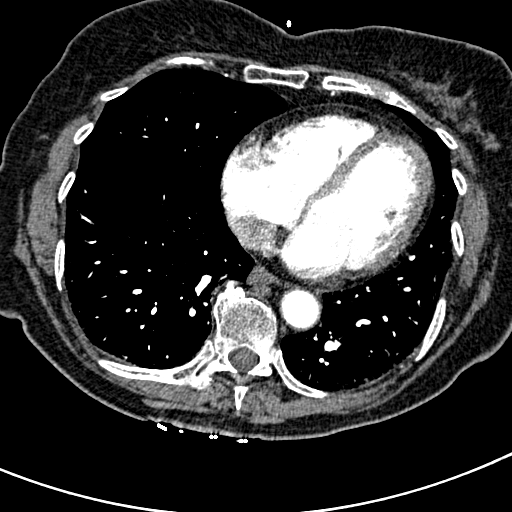
[im 62/235  lung]
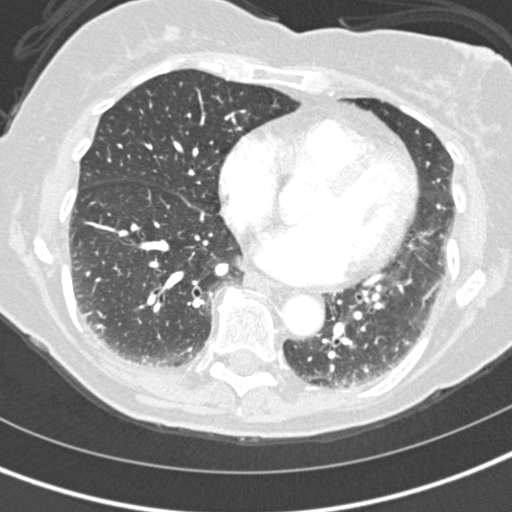
[im 74/235  mediastinal]
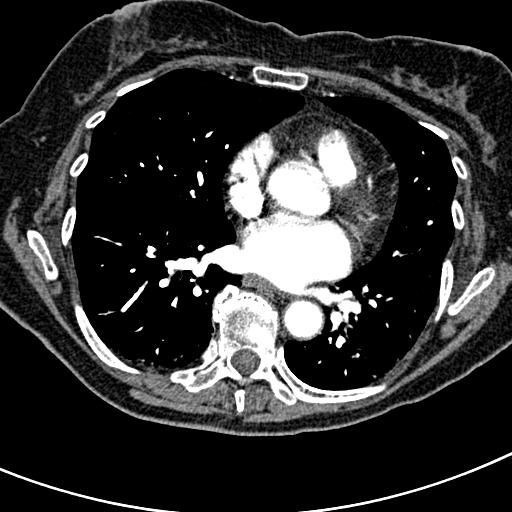
[im 87/235  lung]
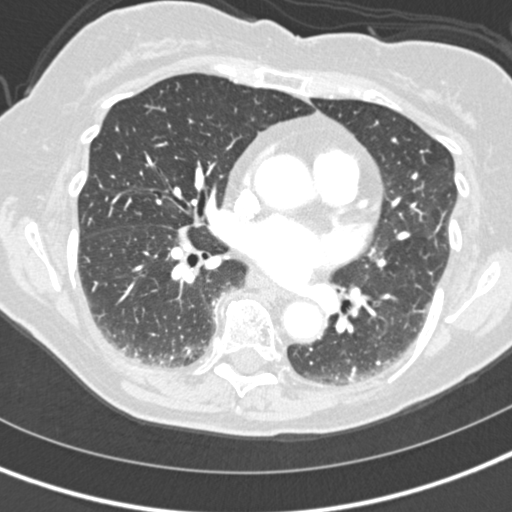
[im 99/235  mediastinal]
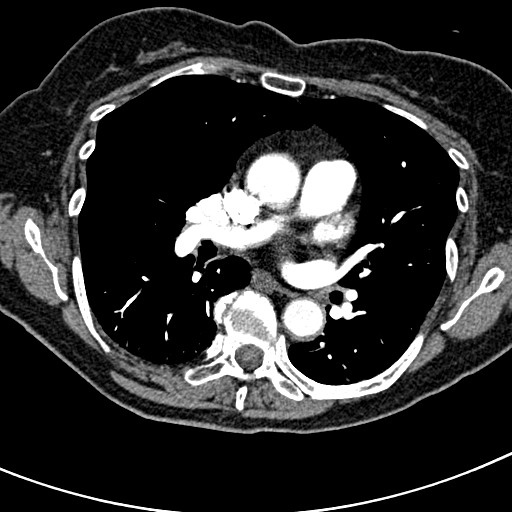
[im 111/235  lung]
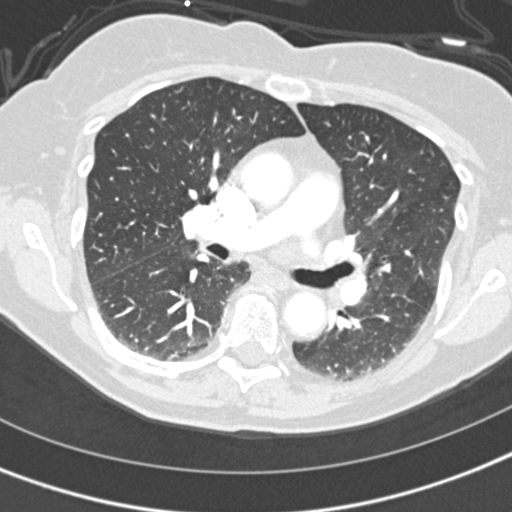
[im 124/235  mediastinal]
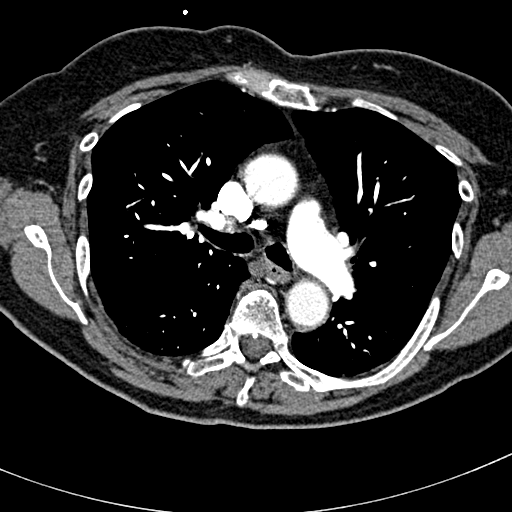
[im 136/235  lung]
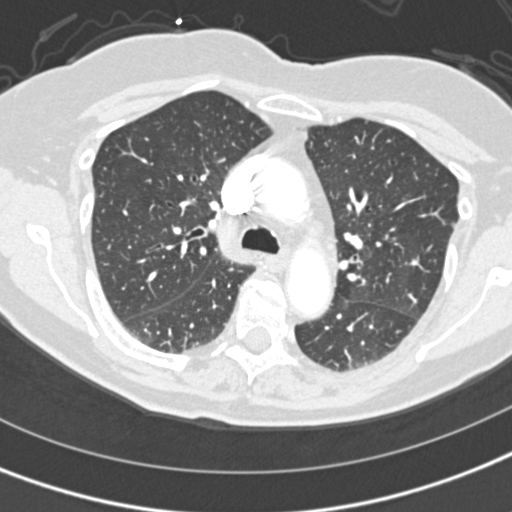
[im 148/235  mediastinal]
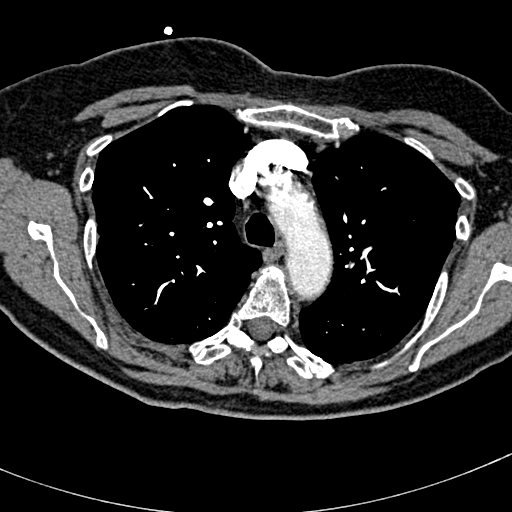
[im 161/235  lung]
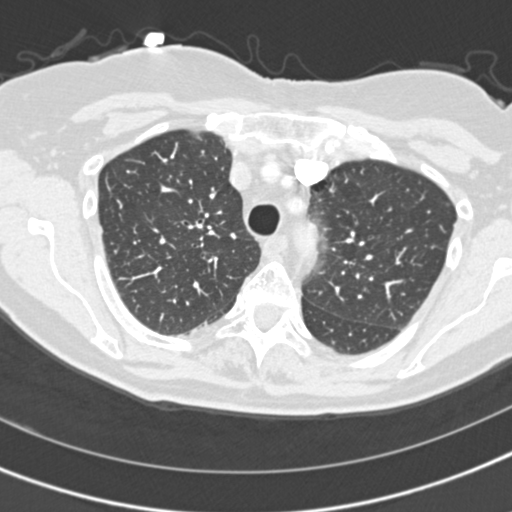
[im 173/235  mediastinal]
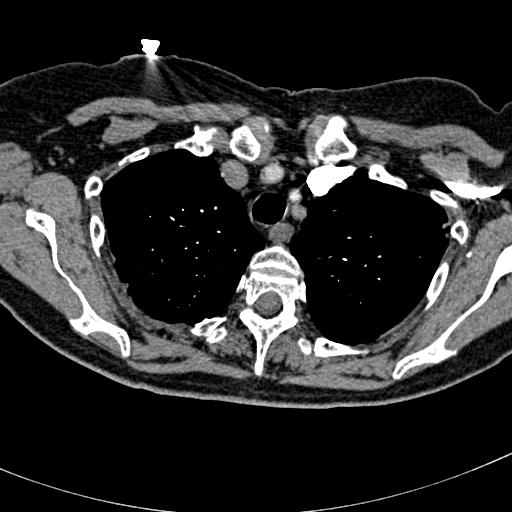
[im 185/235  lung]
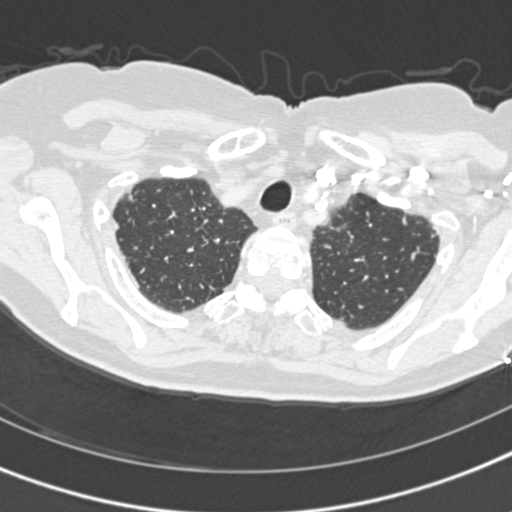
[im 198/235  mediastinal]
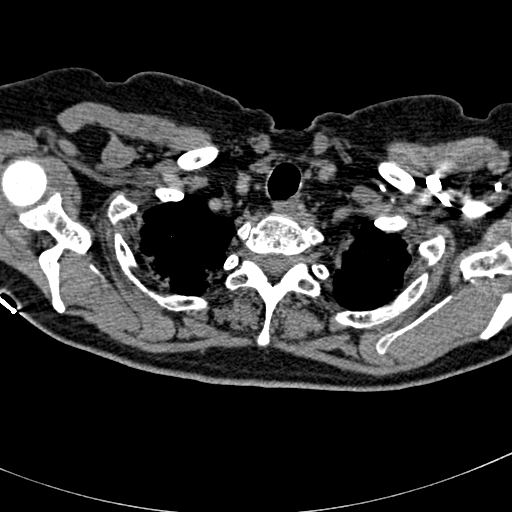
[im 210/235  lung]
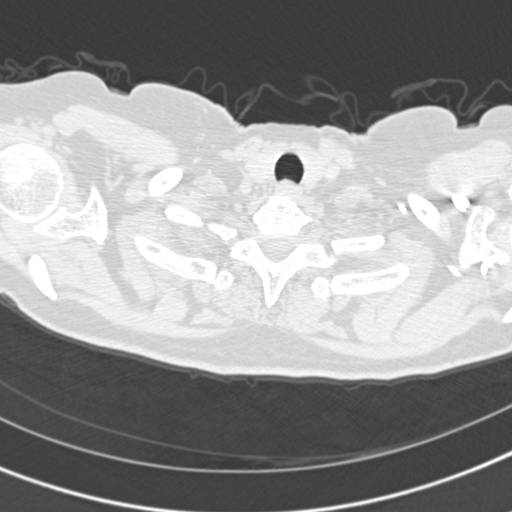
[im 222/235  mediastinal]
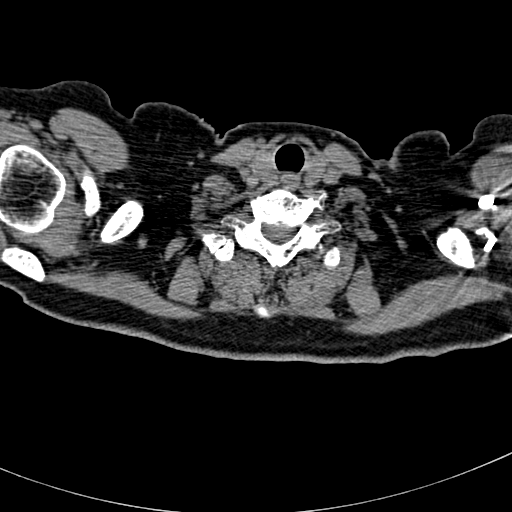

[Series 602: <mpr thick range> · coronal · 0.57mm/px · 1 of 109 slices shown]
[im 55/109  mediastinal]
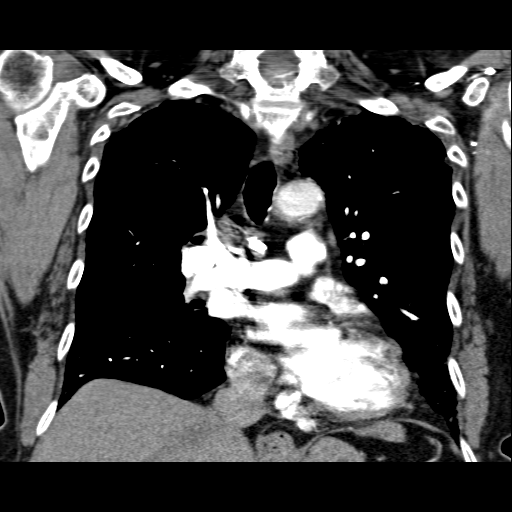

[19 of 36 positions shown; findings below may reference images not displayed]

FINDINGS: No pneumothorax or pleural effusion is noted. 6 mm subpleural nodule
is seen laterally in the left upper lobe best seen on image number
21 of series 7. Smaller adjacent nodule measuring 3 mm is noted.
Right lung is clear. There is no evidence of pulmonary embolus.
Visualized portion of upper abdomen appears normal. Thoracic aorta
appears normal without evidence of aneurysm or dissection. No
significant mediastinal adenopathy or mass is noted. No significant
osseous abnormality is noted.

Review of the MIP images confirms the above findings.
IMPRESSION: No evidence of pulmonary embolus.

6 mm nodule is seen in left upper lobe. If the patient is at high
risk for bronchogenic carcinoma, follow-up chest CT at 6-12 months
is recommended. If the patient is at low risk for bronchogenic
carcinoma, follow-up chest CT at 12 months is recommended. This
recommendation follows the consensus statement: Guidelines for
Management of Small Pulmonary Nodules Detected on CT Scans: A
Statement from the [HOSPITAL] as published in Radiology

## 2016-05-28 ENCOUNTER — Observation Stay (HOSPITAL_COMMUNITY)
Admission: EM | Admit: 2016-05-28 | Discharge: 2016-05-30 | Disposition: A | Discharge status: Home / Self Care | Payer: Medicare Other | Attending: Internal Medicine | Admitting: Internal Medicine

## 2016-05-28 ENCOUNTER — Emergency Department (HOSPITAL_COMMUNITY): Payer: Medicare Other

## 2016-05-28 ENCOUNTER — Encounter (HOSPITAL_COMMUNITY): Payer: Self-pay

## 2016-05-28 DIAGNOSIS — M1710 Unilateral primary osteoarthritis, unspecified knee: Secondary | ICD-10-CM | POA: Insufficient documentation

## 2016-05-28 DIAGNOSIS — Z7982 Long term (current) use of aspirin: Secondary | ICD-10-CM | POA: Diagnosis not present

## 2016-05-28 DIAGNOSIS — M1610 Unilateral primary osteoarthritis, unspecified hip: Secondary | ICD-10-CM | POA: Diagnosis not present

## 2016-05-28 DIAGNOSIS — R42 Dizziness and giddiness: Secondary | ICD-10-CM | POA: Insufficient documentation

## 2016-05-28 DIAGNOSIS — Z85828 Personal history of other malignant neoplasm of skin: Secondary | ICD-10-CM | POA: Insufficient documentation

## 2016-05-28 DIAGNOSIS — K219 Gastro-esophageal reflux disease without esophagitis: Secondary | ICD-10-CM | POA: Diagnosis not present

## 2016-05-28 DIAGNOSIS — E039 Hypothyroidism, unspecified: Secondary | ICD-10-CM | POA: Diagnosis not present

## 2016-05-28 DIAGNOSIS — Z79899 Other long term (current) drug therapy: Secondary | ICD-10-CM | POA: Insufficient documentation

## 2016-05-28 DIAGNOSIS — G43909 Migraine, unspecified, not intractable, without status migrainosus: Secondary | ICD-10-CM | POA: Diagnosis not present

## 2016-05-28 DIAGNOSIS — R072 Precordial pain: Principal | ICD-10-CM | POA: Insufficient documentation

## 2016-05-28 DIAGNOSIS — Z880 Allergy status to penicillin: Secondary | ICD-10-CM | POA: Insufficient documentation

## 2016-05-28 DIAGNOSIS — E785 Hyperlipidemia, unspecified: Secondary | ICD-10-CM | POA: Diagnosis not present

## 2016-05-28 DIAGNOSIS — R079 Chest pain, unspecified: Secondary | ICD-10-CM | POA: Diagnosis present

## 2016-05-28 DIAGNOSIS — Z8249 Family history of ischemic heart disease and other diseases of the circulatory system: Secondary | ICD-10-CM | POA: Insufficient documentation

## 2016-05-28 DIAGNOSIS — Z86718 Personal history of other venous thrombosis and embolism: Secondary | ICD-10-CM | POA: Insufficient documentation

## 2016-05-28 HISTORY — DX: Gastro-esophageal reflux disease without esophagitis: K21.9

## 2016-05-28 HISTORY — DX: Hyperlipidemia, unspecified: E78.5

## 2016-05-28 HISTORY — DX: Migraine, unspecified, not intractable, without status migrainosus: G43.909

## 2016-05-28 HISTORY — DX: Hypothyroidism, unspecified: E03.9

## 2016-05-28 LAB — I-STAT TROPONIN, ED: TROPONIN I, POC: 0 ng/mL (ref 0.00–0.08)

## 2016-05-28 LAB — CBC
HCT: 39.7 % (ref 36.0–46.0)
HEMOGLOBIN: 12.6 g/dL (ref 12.0–15.0)
MCH: 29.4 pg (ref 26.0–34.0)
MCHC: 31.7 g/dL (ref 30.0–36.0)
MCV: 92.5 fL (ref 78.0–100.0)
PLATELETS: 218 10*3/uL (ref 150–400)
RBC: 4.29 MIL/uL (ref 3.87–5.11)
RDW: 14.2 % (ref 11.5–15.5)
WBC: 7.7 10*3/uL (ref 4.0–10.5)

## 2016-05-28 LAB — BASIC METABOLIC PANEL
ANION GAP: 5 (ref 5–15)
BUN: 11 mg/dL (ref 6–20)
CALCIUM: 9.2 mg/dL (ref 8.9–10.3)
CO2: 26 mmol/L (ref 22–32)
Chloride: 107 mmol/L (ref 101–111)
Creatinine, Ser: 0.82 mg/dL (ref 0.44–1.00)
GFR calc Af Amer: >60 mL/min (ref >60)
GFR calc non Af Amer: >60 mL/min (ref >60)
GLUCOSE: 144 mg/dL — AB (ref 65–99)
POTASSIUM: 4 mmol/L (ref 3.5–5.1)
Sodium: 138 mmol/L (ref 135–145)

## 2016-05-28 LAB — TROPONIN I

## 2016-05-28 MED ORDER — NITROGLYCERIN 0.4 MG SL SUBL
0.4000 mg | SUBLINGUAL_TABLET | SUBLINGUAL | Status: DC | PRN
Start: 2016-05-28 — End: 2016-05-28
  Administered 2016-05-28 (×3): 0.4 mg via SUBLINGUAL
  Filled 2016-05-28: qty 1

## 2016-05-28 MED ORDER — ONDANSETRON HCL 4 MG/2ML IJ SOLN: 4.0000 mg | Freq: Four times a day (QID) | INTRAMUSCULAR | Status: DC | PRN

## 2016-05-28 MED ORDER — MORPHINE SULFATE (PF) 2 MG/ML IV SOLN: 2.0000 mg | INTRAVENOUS | Status: DC | PRN

## 2016-05-28 MED ORDER — ASPIRIN EC 81 MG PO TBEC
81.0000 mg | DELAYED_RELEASE_TABLET | ORAL | Status: DC
  Administered 2016-05-29 – 2016-05-30 (×2): 81 mg via ORAL
  Filled 2016-05-28 (×2): qty 1

## 2016-05-28 MED ORDER — ASPIRIN 81 MG PO CHEW
324.0000 mg | CHEWABLE_TABLET | Freq: Once | ORAL | Status: AC
  Administered 2016-05-28: 324 mg via ORAL
  Filled 2016-05-28: qty 4

## 2016-05-28 MED ORDER — PANTOPRAZOLE SODIUM 40 MG PO TBEC
40.0000 mg | DELAYED_RELEASE_TABLET | Freq: Every day | ORAL | Status: DC
  Administered 2016-05-29 – 2016-05-30 (×2): 40 mg via ORAL
  Filled 2016-05-28 (×2): qty 1

## 2016-05-28 MED ORDER — ACETAMINOPHEN 325 MG PO TABS: 650.0000 mg | ORAL_TABLET | ORAL | Status: DC | PRN

## 2016-05-28 MED ORDER — ENOXAPARIN SODIUM 40 MG/0.4ML ~~LOC~~ SOLN
40.0000 mg | Freq: Every day | SUBCUTANEOUS | Status: DC
  Administered 2016-05-28 – 2016-05-29 (×2): 40 mg via SUBCUTANEOUS
  Filled 2016-05-28 (×2): qty 0.4

## 2016-05-28 MED ORDER — HYDROCODONE-ACETAMINOPHEN 10-325 MG PO TABS: 1.0000 | ORAL_TABLET | Freq: Four times a day (QID) | ORAL | Status: DC | PRN

## 2016-05-28 MED ORDER — GABAPENTIN 300 MG PO CAPS
300.0000 mg | ORAL_CAPSULE | Freq: Every day | ORAL | Status: DC
  Administered 2016-05-29 – 2016-05-30 (×2): 300 mg via ORAL
  Filled 2016-05-28 (×2): qty 1

## 2016-05-28 MED ORDER — LEVOTHYROXINE SODIUM 50 MCG PO TABS
50.0000 ug | ORAL_TABLET | Freq: Every day | ORAL | Status: DC
  Administered 2016-05-29: 50 ug via ORAL
  Filled 2016-05-28: qty 1

## 2016-05-28 NOTE — ED Notes (Signed)
Report called to Miranda, RN at this time.  Receiving nurse denies having any further questions at this time.

## 2016-05-28 NOTE — ED Provider Notes (Signed)
[facility] DEPT Provider Note   CSN: [id number] Arrival date & time: 05/28/16  1346     History   Chief Complaint Chief Complaint  Patient presents with  . Chest Pain    HPI Jamie Beltran is a 81 y.o. female.  The history is provided by the patient.  Chest Pain   This is a new problem. The current episode started 1 to 2 hours ago. The problem occurs daily. The problem has been resolved. The pain is associated with exertion (mopping). The pain is present in the substernal region. The quality of the pain is described as exertional and pressure-like. The pain radiates to the left shoulder. The symptoms are aggravated by exertion. Associated symptoms include diaphoresis. Pertinent negatives include no abdominal pain. She has tried rest for the symptoms. The treatment provided mild relief. Risk factors include being elderly.  Pertinent negatives for past medical history include no diabetes.    Past Medical History:  Diagnosis Date  . Basal cell carcinoma   . Blood in stool   . Colonic polyp   . Diverticulitis   . Diverticulosis   . Esophageal reflux   . Fibrocystic breast disease   . Hiatal hernia   . Migraine headache   . OA (osteoarthritis)    back, hips, knees  . Other and unspecified hyperlipidemia     Patient Active Problem List   Diagnosis Date Noted  . DVT (deep venous thrombosis) ([place]) 07/14/2014  . Back pain 07/14/2014  . Vasovagal syncope 05/19/2014    Past Surgical History:  Procedure Laterality Date  . BLADDER SUSPENSION  1986  . CATARACT EXTRACTION, BILATERAL    . CHOLECYSTECTOMY  2002  . COLONOSCOPY  04/08  . ESOPHAGOGASTRODUODENOSCOPY  9/06, 4/08  . FOOT SURGERY  1990  . HEEL SPUR EXCISION  2001  . KNEE ARTHROSCOPY Bilateral 2006, 2013  . TOTAL ABDOMINAL HYSTERECTOMY W/ BILATERAL SALPINGOOPHORECTOMY  1981  . VEIN LIGATION Left 1980    OB History    No data available       Home Medications    Prior to Admission medications     Medication Sig Start Date End Date Taking? Authorizing Provider  aspirin EC 81 MG tablet Take 81 mg by mouth every morning.   Yes Historical Provider, MD  gabapentin (NEURONTIN) 300 MG capsule Take 300 mg by mouth every morning.    Yes Historical Provider, MD  HYDROcodone-acetaminophen (NORCO) 10-325 MG per tablet Take 1 tablet by mouth every 6 (six) hours as needed for moderate pain.    Yes Historical Provider, MD  levothyroxine (SYNTHROID, LEVOTHROID) 50 MCG tablet Take 50 mcg by mouth daily before breakfast.   Yes Historical Provider, MD  pantoprazole (PROTONIX) 40 MG tablet Take 40 mg by mouth daily.    Yes Historical Provider, MD    Family History Family History  Problem Relation Age of Onset  . Hypertension Father   . Heart attack Mother   . Kidney Stones Sister     kidney problems  . Kidney Stones Sister     kidney transplant    Social History Social History  Substance Use Topics  . Smoking status: Former Smoker    Packs/day: 1.00    Years: 20.00    Types: Cigarettes    Quit date: 09/23/1980  . Smokeless tobacco: Never Used  . Alcohol use Yes     Comment: rarely     Allergies   Codeine; Penicillins; Adhesive [tape]; and Protonix [pantoprazole sodium]   Review  of Systems Review of Systems  Constitutional: Positive for diaphoresis.  Cardiovascular: Positive for chest pain.  Gastrointestinal: Negative for abdominal pain.  All other systems reviewed and are negative.    Physical Exam Updated Vital Signs BP 114/56 (BP Location: Right Arm)   Pulse 74   Temp 98.1 F (36.7 C) (Oral)   Resp 17   Ht 5\' 7"  (1.702 m)   Wt 168 lb (76.2 kg)   SpO2 100%   BMI 26.31 kg/m   Physical Exam  Constitutional: She is oriented to person, place, and time. She appears well-developed and well-nourished. No distress.  HENT:  Head: Normocephalic.  Eyes: Conjunctivae are normal.  Neck: Neck supple. No tracheal deviation present.  Cardiovascular: Normal rate, regular rhythm  and normal heart sounds.   Pulmonary/Chest: Effort normal and breath sounds normal. No respiratory distress. She has no wheezes. She has no rales.  Abdominal: Soft. She exhibits no distension.  Neurological: She is alert and oriented to person, place, and time.  Skin: Skin is warm and dry.  Psychiatric: She has a normal mood and affect.     ED Treatments / Results  Labs (all labs ordered are listed, but only abnormal results are displayed) Labs Reviewed  BASIC METABOLIC PANEL - Abnormal; Notable for the following:       Result Value   Glucose, Bld 144 (*)    All other components within normal limits  CBC  I-STAT TROPOININ, ED    EKG  EKG Interpretation  Date/Time:  Tuesday May 28 2016 13:55:56 EDT Ventricular Rate:  75 PR Interval:  166 QRS Duration: 78 QT Interval:  390 QTC Calculation: 435 R Axis:   53 Text Interpretation:  Normal sinus rhythm ABNORMAL R WAVE PROGRESSION Nonspecific T wave abnormality, improved in Anterior leads Otherwise no significant change Confirmed by Krishan Mcbreen MD, Poet Hineman [phone]) on 05/28/2016 7:24:59 PM       Radiology Dg Chest 2 View  Result Date: 05/28/2016 CLINICAL DATA:  Chest pain with shortness of breath EXAM: CHEST  2 VIEW COMPARISON:  Chest radiograph September 24, 2011 ; chest CT October 06, 2015 FINDINGS: There is scarring in the apices, stable. There is no edema or consolidation. Heart size and pulmonary vascularity are normal. There is atherosclerotic calcification in the aorta. There is a calcified granuloma in the right hilum, stable. No adenopathy is evident. There is degenerative change in the thoracic spine. IMPRESSION: Evidence of prior granulomatous disease. Bilateral apical scarring. No edema or consolidation. Stable cardiac silhouette. There is aortic atherosclerosis. Electronically Signed   By: Lowella Grip III M.D.   On: 05/28/2016 15:08    Procedures Procedures (including critical care time)  Medications Ordered in  ED Medications - No data to display   Initial Impression / Assessment and Plan / ED Course  I have reviewed the triage vital signs and the nursing notes.  Pertinent labs & imaging results that were available during my care of the patient were reviewed by me and considered in my medical decision making (see chart for details).  Clinical Course    81 y.o. female presents with more frequent episodes of diaphoresis and lightheadedness with activity. Tonight she had episode of substernal chest pressure radiating to left shoulder with diaphoresis and shortness of breath that did not resolve for several minutes. High risk given description and risk factors with advanced age. Admit for ACS r/o. Hospitalist was consulted for admission and will see the patient in the emergency department.   Final Clinical Impressions(s) /  ED Diagnoses   Final diagnoses:  Chest pain, unspecified chest pain type    New Prescriptions New Prescriptions   No medications on file     Leo Grosser, MD 05/29/16 0107

## 2016-05-28 NOTE — H&P (Signed)
Jamie Beltran [id number] DOB: 1934-10-07 DOA: 05/28/2016     PCP: Shirline Frees, MD   Outpatient Specialists: cardiology [facility], Neurosurgery [facility] Patient coming from:   home Lives   With family    Chief Complaint: Chest pain  HPI: Jamie Beltran is a 81 y.o. female with medical history significant of DVT, vasovagal syncope, DDD   Presented with since this morning developed episode of chest pressure (like someone was sitting on her chest) associated with diaphoresis, feeling hot, lightheaded, shortness of breath and generalized fatigue first episode lasted about 15 minutes but second episode was only a few minutes long. Symptoms have rapidly resolved. Patient was doing activity that time mopping pain was substernal worse with exertion felt like pressure/and ejection radiating to left shoulder not associated with abdominal pain no fevers or chills she rested and that seemed to make things much better she has no prior history of coronary artery disease or diabetes. Patient reports she have had some episodes like this in the past usually she does better with rest. She have had much lighter episodes with exertion but never that severe.   She does water aerobics and walks the dog every morning she gets tired but no chest pain.    Regarding pertinent Chronic problems: In 2012 patient was evaluated for lightheadedness at that time was thought to be secondary to vasovagal component she had carotid ultrasound done which was reassuring nuclear stress test was low risk, echo showed normal EF,  She had DVT associated with ankle fracture in 2015 and was treated with several total  IN ER:  Temp (24hrs), Avg:97.7 F (36.5 C), Min:97.3 F (36.3 C), Max:98.1 F (36.7 C)  Blood pressure 123/69 heart rate 71 satting 97% Trop 0.00 Cr 0.82 WBC 7.7  Chest x-ray showing bilateral apical scarring and prior granulomatous disease or tick arthrosclerosis noted Following Medications were ordered in  ER: Medications  nitroGLYCERIN (NITROSTAT) SL tablet 0.4 mg (0.4 mg Sublingual Given 05/28/16 2025)  aspirin chewable tablet 324 mg (324 mg Oral Given 05/28/16 2017)     Hospitalist was called for admission for Chest pain evaluation  Review of Systems:    Pertinent positives include: chest pain,, indigestion, fatigue,  Constitutional:  No weight loss, night sweats, Fevers, chills,  weight loss  HEENT:  No headaches, Difficulty swallowing,Tooth/dental problems,Sore throat,  No sneezing, itching, ear ache, nasal congestion, post nasal drip,  Cardio-vascular:  No  Orthopnea, PND, anasarca, dizziness, palpitations.no Bilateral lower extremity swelling  GI:  No heartburnabdominal pain, nausea, vomiting, diarrhea, change in bowel habits, loss of appetite, melena, blood in stool, hematemesis Resp:  no shortness of breath at rest. No dyspnea on exertion, No excess mucus, no productive cough, No non-productive cough, No coughing up of blood.No change in color of mucus.No wheezing. Skin:  no rash or lesions. No jaundice GU:  no dysuria, change in color of urine, no urgency or frequency. No straining to urinate.  No flank pain.  Musculoskeletal:  No joint pain or no joint swelling. No decreased range of motion. No back pain.  Psych:  No change in mood or affect. No depression or anxiety. No memory loss.  Neuro: no localizing neurological complaints, no tingling, no weakness, no double vision, no gait abnormality, no slurred speech, no confusion  As per HPI otherwise 10 point review of systems negative.   Past Medical History: Past Medical History:  Diagnosis Date  . Basal cell carcinoma   . Blood in stool   .  Colonic polyp   . Diverticulitis   . Diverticulosis   . Esophageal reflux   . Fibrocystic breast disease   . Hiatal hernia   . Migraine headache   . OA (osteoarthritis)    back, hips, knees  . Other and unspecified hyperlipidemia    Past Surgical History:  Procedure  Laterality Date  . BLADDER SUSPENSION  1986  . CATARACT EXTRACTION, BILATERAL    . CHOLECYSTECTOMY  2002  . COLONOSCOPY  04/08  . ESOPHAGOGASTRODUODENOSCOPY  9/06, 4/08  . FOOT SURGERY  1990  . HEEL SPUR EXCISION  2001  . KNEE ARTHROSCOPY Bilateral 2006, 2013  . TOTAL ABDOMINAL HYSTERECTOMY W/ BILATERAL SALPINGOOPHORECTOMY  1981  . VEIN LIGATION Left 1980     Social History:  Ambulatory   independently      reports that she quit smoking about 35 years ago. Her smoking use included Cigarettes. She has a 20.00 pack-year smoking history. She has never used smokeless tobacco. She reports that she drinks alcohol. She reports that she does not use drugs.  Allergies:   Allergies  Allergen Reactions  . Codeine Other (See Comments)    "Makes me crazy."  . Penicillins Hives and Swelling    Has patient had a PCN reaction causing immediate rash, facial/tongue/throat swelling, SOB or lightheadedness with hypotension: Yes Has patient had a PCN reaction causing severe rash involving mucus membranes or skin necrosis: No Has patient had a PCN reaction that required hospitalization No Has patient had a PCN reaction occurring within the last 10 years: No If all of the above answers are "NO", then may proceed with Cephalosporin use.   . Adhesive [Tape] Itching    Heart monitor pads  . Protonix [Pantoprazole Sodium] Diarrhea    With 40 mg twice daily, can tolerate 40 mg once daily         Family History:   Family History  Problem Relation Age of Onset  . Hypertension Father   . Heart attack Mother   . Kidney Stones Sister     kidney problems  . Kidney Stones Sister     kidney transplant    Medications: Prior to Admission medications   Medication Sig Start Date End Date Taking? Authorizing Provider  aspirin EC 81 MG tablet Take 81 mg by mouth every morning.   Yes Historical Provider, MD  gabapentin (NEURONTIN) 300 MG capsule Take 300 mg by mouth every morning.    Yes Historical  Provider, MD  HYDROcodone-acetaminophen (NORCO) 10-325 MG per tablet Take 1 tablet by mouth every 6 (six) hours as needed for moderate pain.    Yes Historical Provider, MD  levothyroxine (SYNTHROID, LEVOTHROID) 50 MCG tablet Take 50 mcg by mouth daily before breakfast.   Yes Historical Provider, MD  pantoprazole (PROTONIX) 40 MG tablet Take 40 mg by mouth daily.    Yes Historical Provider, MD    Physical Exam: Patient Vitals for the past 24 hrs:  BP Temp Temp src Pulse Resp SpO2 Height Weight  05/28/16 2015 147/66 - - 70 17 98 % - -  05/28/16 1930 - - - 74 17 100 % - -  05/28/16 1915 - - - 72 16 98 % - -  05/28/16 1900 - - - 72 15 100 % - -  05/28/16 1854 - - - 73 14 100 % - -  05/28/16 1620 114/56 98.1 F (36.7 C) Oral 72 16 97 % - -  05/28/16 1355 123/69 97.3 F (36.3 C) Oral 71 17  97 % 5\' 7"  (1.702 m) 76.2 kg (168 lb)    1. General:  in No Acute distress 2. Psychological: Alert and Oriented 3. Head/ENT:   Moist  Mucous Membranes                          Head Non traumatic, neck supple                          Normal   Dentition 4. SKIN:   decreased Skin turgor,  Skin clean Dry and intact no rash 5. Heart: Regular rate and rhythm no  Murmur, Rub or gallop 6. Lungs:  Clear to auscultation bilaterally, no wheezes or crackles   7. Abdomen: Soft, non-tender, Non distended 8. Lower extremities: no clubbing, cyanosis, or edema 9. Neurologically Grossly intact, moving all 4 extremities equally  10. MSK: Normal range of motion   body mass index is 26.31 kg/m.  Labs on Admission:   Labs on Admission: I have personally reviewed following labs and imaging studies  CBC:  Recent Labs Lab 05/28/16 1402  WBC 7.7  HGB 12.6  HCT 39.7  MCV 92.5  PLT 99991111   Basic Metabolic Panel:  Recent Labs Lab 05/28/16 1402  NA 138  K 4.0  CL 107  CO2 26  GLUCOSE 144*  BUN 11  CREATININE 0.82  CALCIUM 9.2   GFR: Estimated Creatinine Clearance: 60.2 mL/min (by C-G formula based on  SCr of 0.82 mg/dL). Liver Function Tests: No results for input(s): AST, ALT, ALKPHOS, BILITOT, PROT, ALBUMIN in the last 168 hours. No results for input(s): LIPASE, AMYLASE in the last 168 hours. No results for input(s): AMMONIA in the last 168 hours. Coagulation Profile: No results for input(s): INR, PROTIME in the last 168 hours. Cardiac Enzymes: No results for input(s): CKTOTAL, CKMB, CKMBINDEX, TROPONINI in the last 168 hours. BNP (last 3 results) No results for input(s): PROBNP in the last 8760 hours. HbA1C: No results for input(s): HGBA1C in the last 72 hours. CBG: No results for input(s): GLUCAP in the last 168 hours. Lipid Profile: No results for input(s): CHOL, HDL, LDLCALC, TRIG, CHOLHDL, LDLDIRECT in the last 72 hours. Thyroid Function Tests: No results for input(s): TSH, T4TOTAL, FREET4, T3FREE, THYROIDAB in the last 72 hours. Anemia Panel: No results for input(s): VITAMINB12, FOLATE, FERRITIN, TIBC, IRON, RETICCTPCT in the last 72 hours.    Sepsis Labs: @LABRCNTIP (procalcitonin:4,lacticidven:4) )No results found for this or any previous visit (from the past 240 hour(s)).     UA   not ordered  No results found for: HGBA1C  Estimated Creatinine Clearance: 60.2 mL/min (by C-G formula based on SCr of 0.82 mg/dL).  BNP (last 3 results) No results for input(s): PROBNP in the last 8760 hours.   ECG REPORT  Independently reviewed Rate: 75  Rhythm: Normal sinus rhythm ST&T Change: Slightly flattened T waves in lateral leads QTC 435  Filed Weights   05/28/16 1355  Weight: 76.2 kg (168 lb)     Cultures: No results found for: [place], [place], CULT, REPTSTATUS   Radiological Exams on Admission: Dg Chest 2 View  Result Date: 05/28/2016 CLINICAL DATA:  Chest pain with shortness of breath EXAM: CHEST  2 VIEW COMPARISON:  Chest radiograph September 24, 2011 ; chest CT October 06, 2015 FINDINGS: There is scarring in the apices, stable. There is no edema or  consolidation. Heart size and pulmonary vascularity are normal. There is atherosclerotic  calcification in the aorta. There is a calcified granuloma in the right hilum, stable. No adenopathy is evident. There is degenerative change in the thoracic spine. IMPRESSION: Evidence of prior granulomatous disease. Bilateral apical scarring. No edema or consolidation. Stable cardiac silhouette. There is aortic atherosclerosis. Electronically Signed   By: Lowella Grip III M.D.   On: 05/28/2016 15:08    Chart has been reviewed    Assessment/Plan  81 y.o. female with medical history significant of DVT, vasovagal syncope, DDD being admitted for chest pain.  Present on Admission: . Chest pain - - given risk factors will admit, monitor on telemetry, cycle cardiac enzymes, obtain serial ECG. Further risk stratify with lipid panel, hgA1C, obtain TSH. Make sure patient is on Aspirin. Further treatment based on the currently pending results. Will email cardiology    Other plan as per orders.  DVT prophylaxis:   Lovenox     Code Status:  FULL CODE   as per patient    Family Communication:   Family  at  Bedside  plan of care was discussed with  Jamie Beltran [phone] Disposition Plan:      To home once workup is complete and patient is stable   Consults called: emailed cardiology  Admission status:    obs   Level of care     tele      I have spent a total of 56 min on this admission    Jamie Beltran 05/28/2016, 9:24 PM    [facility]  Pager [phone]   after 2 AM please page floor coverage PA If 7AM-7PM, please contact the day team taking care of the patient  [email or web address]  Password [email or web address]

## 2016-05-28 NOTE — ED Triage Notes (Signed)
Onset this morning pt had two episodes of chest pressure, shortness of breath, diaphoresis and weakness.  1st episode lasted 15 minutes, second episode lasted few minutes.  No chest pressure or shortness of breath at this time.

## 2016-05-28 NOTE — ED Notes (Signed)
Attempted to call report at this time 

## 2016-05-29 ENCOUNTER — Observation Stay (HOSPITAL_BASED_OUTPATIENT_CLINIC_OR_DEPARTMENT_OTHER): Payer: Medicare Other

## 2016-05-29 ENCOUNTER — Encounter (HOSPITAL_COMMUNITY): Payer: Self-pay | Admitting: Internal Medicine

## 2016-05-29 ENCOUNTER — Ambulatory Visit (HOSPITAL_COMMUNITY)
Admission: EM | Disposition: A | Discharge status: Home / Self Care | Payer: Self-pay | Source: Home / Self Care | Attending: Emergency Medicine

## 2016-05-29 DIAGNOSIS — I200 Unstable angina: Secondary | ICD-10-CM

## 2016-05-29 DIAGNOSIS — E785 Hyperlipidemia, unspecified: Secondary | ICD-10-CM | POA: Diagnosis not present

## 2016-05-29 DIAGNOSIS — R072 Precordial pain: Secondary | ICD-10-CM | POA: Diagnosis not present

## 2016-05-29 DIAGNOSIS — E039 Hypothyroidism, unspecified: Secondary | ICD-10-CM

## 2016-05-29 DIAGNOSIS — R079 Chest pain, unspecified: Secondary | ICD-10-CM

## 2016-05-29 DIAGNOSIS — G43909 Migraine, unspecified, not intractable, without status migrainosus: Secondary | ICD-10-CM

## 2016-05-29 DIAGNOSIS — K219 Gastro-esophageal reflux disease without esophagitis: Secondary | ICD-10-CM | POA: Diagnosis not present

## 2016-05-29 HISTORY — DX: Migraine, unspecified, not intractable, without status migrainosus: G43.909

## 2016-05-29 HISTORY — PX: CARDIAC CATHETERIZATION: SHX172

## 2016-05-29 HISTORY — DX: Hyperlipidemia, unspecified: E78.5

## 2016-05-29 HISTORY — DX: Hypothyroidism, unspecified: E03.9

## 2016-05-29 HISTORY — DX: Gastro-esophageal reflux disease without esophagitis: K21.9

## 2016-05-29 LAB — LIPID PANEL
CHOLESTEROL: 178 mg/dL (ref 0–200)
HDL: 42 mg/dL (ref >40)
LDL Cholesterol: 94 mg/dL (ref 0–99)
Total CHOL/HDL Ratio: 4.2 RATIO
Triglycerides: 212 mg/dL — ABNORMAL HIGH (ref <150)
VLDL: 42 mg/dL — ABNORMAL HIGH (ref 0–40)

## 2016-05-29 LAB — TROPONIN I: Troponin I: <0.03 ng/mL (ref <0.03)

## 2016-05-29 LAB — BASIC METABOLIC PANEL
Anion gap: 4 — ABNORMAL LOW (ref 5–15)
BUN: 9 mg/dL (ref 6–20)
CALCIUM: 9.6 mg/dL (ref 8.9–10.3)
CO2: 28 mmol/L (ref 22–32)
CREATININE: 0.73 mg/dL (ref 0.44–1.00)
Chloride: 110 mmol/L (ref 101–111)
Glucose, Bld: 88 mg/dL (ref 65–99)
Potassium: 4.1 mmol/L (ref 3.5–5.1)
SODIUM: 142 mmol/L (ref 135–145)

## 2016-05-29 LAB — ECHOCARDIOGRAM COMPLETE
HEIGHTINCHES: 64 in
Weight: 2606.4 oz

## 2016-05-29 LAB — PROTIME-INR
INR: 1.03
PROTHROMBIN TIME: 13.5 s (ref 11.4–15.2)

## 2016-05-29 LAB — TSH: TSH: 6.015 u[IU]/mL — AB (ref 0.350–4.500)

## 2016-05-29 SURGERY — LEFT HEART CATH AND CORONARY ANGIOGRAPHY
Anesthesia: LOCAL

## 2016-05-29 MED ORDER — SODIUM CHLORIDE 0.9% FLUSH: 3.0000 mL | INTRAVENOUS | Status: DC | PRN

## 2016-05-29 MED ORDER — LIDOCAINE HCL (PF) 1 % IJ SOLN
INTRAMUSCULAR | Status: AC
  Filled 2016-05-29: qty 30

## 2016-05-29 MED ORDER — LEVOTHYROXINE SODIUM 50 MCG PO TABS
62.5000 ug | ORAL_TABLET | Freq: Every day | ORAL | Status: DC
  Administered 2016-05-30: 62.5 ug via ORAL
  Filled 2016-05-29: qty 1

## 2016-05-29 MED ORDER — GI COCKTAIL ~~LOC~~: 30.0000 mL | Freq: Three times a day (TID) | ORAL | Status: DC | PRN

## 2016-05-29 MED ORDER — ASPIRIN 81 MG PO CHEW: 81.0000 mg | CHEWABLE_TABLET | ORAL | Status: DC

## 2016-05-29 MED ORDER — HEPARIN SODIUM (PORCINE) 1000 UNIT/ML IJ SOLN
INTRAMUSCULAR | Status: DC | PRN
  Administered 2016-05-29: 4000 [IU] via INTRAVENOUS

## 2016-05-29 MED ORDER — SODIUM CHLORIDE 0.9 % IV SOLN
250.0000 mL | INTRAVENOUS | Status: DC | PRN
Start: 2016-05-29 — End: 2016-05-29

## 2016-05-29 MED ORDER — VERAPAMIL HCL 2.5 MG/ML IV SOLN
INTRAVENOUS | Status: DC | PRN
  Administered 2016-05-29: 10 mL via INTRA_ARTERIAL

## 2016-05-29 MED ORDER — ALUM & MAG HYDROXIDE-SIMETH 200-200-20 MG/5ML PO SUSP
15.0000 mL | ORAL | Status: DC | PRN
  Administered 2016-05-29: 15 mL via ORAL
  Filled 2016-05-29: qty 30

## 2016-05-29 MED ORDER — IOPAMIDOL (ISOVUE-370) INJECTION 76%
INTRAVENOUS | Status: AC
  Filled 2016-05-29: qty 100

## 2016-05-29 MED ORDER — SODIUM CHLORIDE 0.9 % WEIGHT BASED INFUSION
1.0000 mL/kg/h | INTRAVENOUS | Status: DC
  Administered 2016-05-29: 1 mL/kg/h via INTRAVENOUS

## 2016-05-29 MED ORDER — IOPAMIDOL (ISOVUE-370) INJECTION 76%
INTRAVENOUS | Status: DC | PRN
  Administered 2016-05-29: 75 mL via INTRA_ARTERIAL

## 2016-05-29 MED ORDER — MIDAZOLAM HCL 2 MG/2ML IJ SOLN
INTRAMUSCULAR | Status: DC | PRN
  Administered 2016-05-29: 1 mg via INTRAVENOUS

## 2016-05-29 MED ORDER — SODIUM CHLORIDE 0.9 % WEIGHT BASED INFUSION
3.0000 mL/kg/h | INTRAVENOUS | Status: AC
  Administered 2016-05-29: 3 mL/kg/h via INTRAVENOUS

## 2016-05-29 MED ORDER — SODIUM CHLORIDE 0.9% FLUSH: 3.0000 mL | Freq: Two times a day (BID) | INTRAVENOUS | Status: DC

## 2016-05-29 MED ORDER — FENTANYL CITRATE (PF) 100 MCG/2ML IJ SOLN
INTRAMUSCULAR | Status: AC
  Filled 2016-05-29: qty 2

## 2016-05-29 MED ORDER — VERAPAMIL HCL 2.5 MG/ML IV SOLN
INTRAVENOUS | Status: AC
  Filled 2016-05-29: qty 2

## 2016-05-29 MED ORDER — MIDAZOLAM HCL 2 MG/2ML IJ SOLN
INTRAMUSCULAR | Status: AC
  Filled 2016-05-29: qty 2

## 2016-05-29 MED ORDER — SODIUM CHLORIDE 0.9 % IV SOLN: 250.0000 mL | INTRAVENOUS | Status: DC | PRN

## 2016-05-29 MED ORDER — LIDOCAINE HCL (PF) 1 % IJ SOLN
INTRAMUSCULAR | Status: DC | PRN
  Administered 2016-05-29: 2 mL via INTRADERMAL

## 2016-05-29 MED ORDER — FENTANYL CITRATE (PF) 100 MCG/2ML IJ SOLN
INTRAMUSCULAR | Status: DC | PRN
  Administered 2016-05-29: 25 ug via INTRAVENOUS

## 2016-05-29 MED ORDER — HEPARIN (PORCINE) IN NACL 2-0.9 UNIT/ML-% IJ SOLN
INTRAMUSCULAR | Status: AC
  Filled 2016-05-29: qty 1000

## 2016-05-29 MED ORDER — HEPARIN SODIUM (PORCINE) 1000 UNIT/ML IJ SOLN
INTRAMUSCULAR | Status: AC
  Filled 2016-05-29: qty 1

## 2016-05-29 MED ORDER — SODIUM CHLORIDE 0.9% FLUSH
3.0000 mL | Freq: Two times a day (BID) | INTRAVENOUS | Status: DC
  Administered 2016-05-29: 3 mL via INTRAVENOUS

## 2016-05-29 SURGICAL SUPPLY — 12 items
CATH INFINITI 5 FR JL3.5 (CATHETERS) ×1 IMPLANT
CATH INFINITI 5FR AL1 (CATHETERS) ×1 IMPLANT
CATH INFINITI 5FR ANG PIGTAIL (CATHETERS) ×1 IMPLANT
CATH INFINITI JR4 5F (CATHETERS) ×1 IMPLANT
DEVICE RAD COMP TR BAND LRG (VASCULAR PRODUCTS) ×1 IMPLANT
GLIDESHEATH SLEND SS 6F .021 (SHEATH) ×1 IMPLANT
KIT HEART LEFT (KITS) ×2 IMPLANT
PACK CARDIAC CATHETERIZATION (CUSTOM PROCEDURE TRAY) ×2 IMPLANT
TRANSDUCER W/STOPCOCK (MISCELLANEOUS) ×2 IMPLANT
TUBING CIL FLEX 10 FLL-RA (TUBING) ×2 IMPLANT
WIRE EMERALD 3MM-J .035X260CM (WIRE) ×2 IMPLANT
WIRE HI TORQ VERSACORE-J 145CM (WIRE) ×1 IMPLANT

## 2016-05-29 NOTE — Care Management Obs Status (Signed)
[facility] NOTIFICATION   Patient Details  Name: Jamie Beltran MRN: [id number] Date of Birth: 20-May-1935   Medicare Observation Status Notification Given:  Yes    Dawayne Patricia, RN 05/29/2016, 3:05 PM

## 2016-05-29 NOTE — Progress Notes (Signed)
  Echocardiogram 2D Echocardiogram has been performed.  Jamie Beltran M 05/29/2016, 11:57 AM

## 2016-05-29 NOTE — Progress Notes (Signed)
Pt arrived to 2w. Pt placed on tele. Vss. No complaints of pain at all. Will continue to monitor.

## 2016-05-29 NOTE — Progress Notes (Signed)
PROGRESS NOTE    Jamie Beltran  [id number] DOB: 1935-08-16 DOA: 05/28/2016 PCP: Shirline Frees, MD   Brief Narrative:  Jamie Beltran is a 81 y.o. female with medical history significant of DVT, vasovagal syncope, DDD   Presented with since this morning developed episode of chest pressure (like someone was sitting on her chest) associated with diaphoresis, feeling hot, lightheaded, shortness of breath and generalized fatigue first episode lasted about 15 minutes but second episode was only a few minutes long. Symptoms have rapidly resolved. Patient was doing activity that time mopping pain was substernal worse with exertion felt like pressure/and ejection radiating to left shoulder not associated with abdominal pain no fevers or chills she rested and that seemed to make things much better she has no prior history of coronary artery disease or diabetes. Patient reports she have had some episodes like this in the past usually she does better with rest. She have had much lighter episodes with exertion but never that severe.   She does water aerobics and walks the dog every morning she gets tired but no chest pain.    Regarding pertinent Chronic problems: In 2012 patient was evaluated for lightheadedness at that time was thought to be secondary to vasovagal component she had carotid ultrasound done which was reassuring nuclear stress test was low risk, echo showed normal EF,  She had DVT associated with ankle fracture in 2015 and was treated with several total   Assessment & Plan:   Principal Problem:   Chest pain Active Problems:   Hypothyroidism   GERD (gastroesophageal reflux disease)   Hyperlipidemia   Migraine  #1 chest pain Patient presented with chest pain with typical features as patient describes chest pain as a pressure sensation with associated shortness of breath and diaphoresis improved on nitroglycerin. Patient with some clinical improvement. Cardiac enzymes negative  3. Fasting lipid panel with LDL of 94. TSH was 6.015. 2-D echo pending. Continue PPI. GI cocktail when necessary. Due to patient's presentation will consult with cardiology for further evaluation and management.  #2 hypothyroidism TSH at 6.015. Increased Synthroid to 62.5 mcg daily. Repeat thyroid function studies in 4-6 weeks.  #3 gastroesophageal reflux disease  PPI.  #4 hyperlipidemia Patient with a total cholesterol of 178. LDL of 94. Monitor for now. If cardiac workup is positive patient will likely need to be started on a statin with goal LDL of less than 70.  #5 migraine headaches Stable.   DVT prophylaxis: Lovenox Code Status: Full Family Communication: Updated patient. No family present. Disposition Plan: Home pending cardiac workup. Hopefully in 24-48 hours.   Consultants:   Cardiology pending  Procedures:   2-D echo pending  Antimicrobials:   None   Subjective: Patient states chest pain improved since admission however still a little bit of chest pain. No shortness of breath. Patient describes chest pain she had prior to admission as a pressure sensation with some associated diaphoresis and shortness of breath and chest pain improved with nitroglycerin.  Objective: Vitals:   05/28/16 2015 05/28/16 2030 05/28/16 2233 05/29/16 0431  BP: 147/66 135/71 (!) 144/66 (!) 125/53  Pulse: 70  65 62  Resp: 17 15 18 15   Temp:   97.7 F (36.5 C) 97.8 F (36.6 C)  TempSrc:   Oral Oral  SpO2: 98%  99% 96%  Weight:   73.9 kg (162 lb 14.4 oz)   Height:   5\' 4"  (1.626 m)     Intake/Output Summary (Last 24 hours) at  05/29/16 1017 Last data filed at 05/29/16 0800  Gross per 24 hour  Intake              240 ml  Output                0 ml  Net              240 ml   Filed Weights   05/28/16 1355 05/28/16 2233  Weight: 76.2 kg (168 lb) 73.9 kg (162 lb 14.4 oz)    Examination:  General exam: Appears calm and comfortable  Respiratory system: Clear to auscultation.  Respiratory effort normal. Cardiovascular system: S1 & S2 heard, RRR. No JVD, murmurs, rubs, gallops or clicks. No pedal edema. Gastrointestinal system: Abdomen is nondistended, soft and nontender. No organomegaly or masses felt. Normal bowel sounds heard. Central nervous system: Alert and oriented. No focal neurological deficits. Extremities: Symmetric 5 x 5 power. Skin: No rashes, lesions or ulcers Psychiatry: Judgement and insight appear normal. Mood & affect appropriate.     Data Reviewed: I have personally reviewed following labs and imaging studies  CBC:  Recent Labs Lab 05/28/16 1402  WBC 7.7  HGB 12.6  HCT 39.7  MCV 92.5  PLT 99991111   Basic Metabolic Panel:  Recent Labs Lab 05/28/16 1402  NA 138  K 4.0  CL 107  CO2 26  GLUCOSE 144*  BUN 11  CREATININE 0.82  CALCIUM 9.2   GFR: Estimated Creatinine Clearance: 55.7 mL/min (by C-G formula based on SCr of 0.82 mg/dL). Liver Function Tests: No results for input(s): AST, ALT, ALKPHOS, BILITOT, PROT, ALBUMIN in the last 168 hours. No results for input(s): LIPASE, AMYLASE in the last 168 hours. No results for input(s): AMMONIA in the last 168 hours. Coagulation Profile: No results for input(s): INR, PROTIME in the last 168 hours. Cardiac Enzymes:  Recent Labs Lab 05/28/16 2051 05/28/16 2335 05/29/16 0256 05/29/16 0656  TROPONINI <0.03 <0.03 <0.03 <0.03   BNP (last 3 results) No results for input(s): PROBNP in the last 8760 hours. HbA1C: No results for input(s): HGBA1C in the last 72 hours. CBG: No results for input(s): GLUCAP in the last 168 hours. Lipid Profile:  Recent Labs  05/29/16 0256  CHOL 178  HDL 42  LDLCALC 94  TRIG 212*  CHOLHDL 4.2   Thyroid Function Tests:  Recent Labs  05/29/16 0256  TSH 6.015*   Anemia Panel: No results for input(s): VITAMINB12, FOLATE, FERRITIN, TIBC, IRON, RETICCTPCT in the last 72 hours. Sepsis Labs: No results for input(s): PROCALCITON, LATICACIDVEN in  the last 168 hours.  No results found for this or any previous visit (from the past 240 hour(s)).       Radiology Studies: Dg Chest 2 View  Result Date: 05/28/2016 CLINICAL DATA:  Chest pain with shortness of breath EXAM: CHEST  2 VIEW COMPARISON:  Chest radiograph September 24, 2011 ; chest CT October 06, 2015 FINDINGS: There is scarring in the apices, stable. There is no edema or consolidation. Heart size and pulmonary vascularity are normal. There is atherosclerotic calcification in the aorta. There is a calcified granuloma in the right hilum, stable. No adenopathy is evident. There is degenerative change in the thoracic spine. IMPRESSION: Evidence of prior granulomatous disease. Bilateral apical scarring. No edema or consolidation. Stable cardiac silhouette. There is aortic atherosclerosis. Electronically Signed   By: Lowella Grip III M.D.   On: 05/28/2016 15:08        Scheduled Meds: . aspirin EC  81 mg Oral BH-q7a  . enoxaparin (LOVENOX) injection  40 mg Subcutaneous QHS  . gabapentin  300 mg Oral Daily  . [START ON 05/30/2016] levothyroxine  62.5 mcg Oral QAC breakfast  . pantoprazole  40 mg Oral Daily   Continuous Infusions:    LOS: 0 days    Time spent: 40 minutes    THOMPSON,DANIEL, MD [facility] Pager [phone]  If 7PM-7AM, please contact night-coverage [email or web address] Password [email or web address] 05/29/2016, 10:17 AM

## 2016-05-29 NOTE — Consult Note (Signed)
Cardiology Consult    Patient ID: Jamie Beltran MRN: [id number], DOB/AGE: 1935/08/31   Admit date: 05/28/2016 Date of Consult: 05/29/2016  Primary Physician: Shirline Frees, MD Reason for Consult: Chest Pain Primary Cardiologist: Dr. Marlou Porch Requesting Provider: Dr. Grandville Silos   History of Present Illness    Jamie Beltran is a 81 y.o. female with past medical history of recurrent syncope, GERD, OA, and prior tobacco use who presented to [facility] ED on 05/29/2016 for evaluation of chest pain, dyspnea, and weakness.  The patient reports that for the past few years she has experienced episodes of dizziness with exertion. Over the past few months, these episodes have progressed to having dizziness, diaphoresis, and dyspnea with exertion. When going to the grocery store, she has to stop multiple times due to her progressive dyspnea. Also reports when performing chores around the house or cooking meals, she has to stop and rest. She denies any syncopal events.   Yesterday, she was mopping the floors and developed dyspnea and chest pressure. Says it felt like something was sitting on her chest. She sat down and the pain resolved within 15 minutes. She had a repeat episode that lasted several minutes then resolved. She says the pain has not represented with such intensity but she still has a mild pressure in her sternum at this time.    While admitted, labs have shown a WBC of 7.7, Hgb 12.6, and platelets 218. K+ 4.0. Creatinine 0.82. Initial troponin and cyclic troponin values have been negative. Lipid Panel with total cholesterol of 178, HDL 42, and LDL 94. TSH 6.015. CXR with evidence of prior granulomatous disease, bilateral apical scarring, no edema or consolidation, and aortic atherosclerosis noted. EKG shows NSR, HR 67, with minimal TWI in AVR, V1, and V2 (similar to previous tracings).   Has been followed by Dr. Marlou Porch for recurrent syncope. NST in 07/2011 was low-risk with no evidence of  ischemia. Echo at that time showed preserved EF of 60-65% with Grade 1 DD and no wall motion abnormalities. Wore an event monitor with no significant arrhythmias noted.   Past Medical History   Past Medical History:  Diagnosis Date  . Basal cell carcinoma   . Blood in stool   . Colonic polyp   . Diverticulitis   . Diverticulosis   . Esophageal reflux   . Fibrocystic breast disease   . GERD (gastroesophageal reflux disease) 05/29/2016  . Hiatal hernia   . Hyperlipidemia 05/29/2016  . Hypothyroidism 05/29/2016  . Migraine 05/29/2016  . Migraine headache   . OA (osteoarthritis)    back, hips, knees  . Other and unspecified hyperlipidemia     Past Surgical History:  Procedure Laterality Date  . BLADDER SUSPENSION  1986  . CATARACT EXTRACTION, BILATERAL    . CHOLECYSTECTOMY  2002  . COLONOSCOPY  04/08  . ESOPHAGOGASTRODUODENOSCOPY  9/06, 4/08  . FOOT SURGERY  1990  . HEEL SPUR EXCISION  2001  . KNEE ARTHROSCOPY Bilateral 2006, 2013  . TOTAL ABDOMINAL HYSTERECTOMY W/ BILATERAL SALPINGOOPHORECTOMY  1981  . VEIN LIGATION Left 1980     Allergies  Allergies  Allergen Reactions  . Codeine Other (See Comments)    "Makes me crazy."  . Penicillins Hives and Swelling    Has patient had a PCN reaction causing immediate rash, facial/tongue/throat swelling, SOB or lightheadedness with hypotension: Yes Has patient had a PCN reaction causing severe rash involving mucus membranes or skin necrosis: No Has patient had a PCN reaction  that required hospitalization No Has patient had a PCN reaction occurring within the last 10 years: No If all of the above answers are "NO", then may proceed with Cephalosporin use.   . Adhesive [Tape] Itching    Heart monitor pads  . Protonix [Pantoprazole Sodium] Diarrhea    With 40 mg twice daily, can tolerate 40 mg once daily      Inpatient Medications    . aspirin EC  81 mg Oral BH-q7a  . enoxaparin (LOVENOX) injection  40 mg Subcutaneous QHS  .  gabapentin  300 mg Oral Daily  . [START ON 05/30/2016] levothyroxine  62.5 mcg Oral QAC breakfast  . pantoprazole  40 mg Oral Daily    Family History    Family History  Problem Relation Age of Onset  . Hypertension Father   . Heart attack Mother   . Kidney Stones Sister     kidney problems  . Kidney Stones Sister     kidney transplant    Social History    Social History   Social History  . Marital status: Married    Spouse name: Timmothy Sours  . Number of children: 3  . Years of education: HS   Occupational History  . retired   . RETIRED [employer]   Social History Main Topics  . Smoking status: Former Smoker    Packs/day: 1.00    Years: 20.00    Types: Cigarettes    Quit date: 09/23/1980  . Smokeless tobacco: Never Used  . Alcohol use Yes     Comment: rarely  . Drug use: No  . Sexual activity: Not on file   Other Topics Concern  . Not on file   Social History Narrative   Patient lives at home with spouse.   Caffeine use: 1-2 cups daily     Review of Systems    General:  No chills, fever, night sweats or weight changes.  Cardiovascular:  No edema, orthopnea, palpitations, paroxysmal nocturnal dyspnea. Positive for chest pain and dyspnea with exertion.  Dermatological: No rash, lesions/masses Respiratory: No cough, Positive for dyspnea Urologic: No hematuria, dysuria Abdominal:   No nausea, vomiting, diarrhea, bright red blood per rectum, melena, or hematemesis Neurologic:  No visual changes, wkns, changes in mental status. Positive for dizziness.  All other systems reviewed and are otherwise negative except as noted above.  Physical Exam    Blood pressure (!) 139/58, pulse 67, temperature 97.5 F (36.4 C), temperature source Oral, resp. rate 17, height 5\' 4"  (1.626 m), weight 162 lb 14.4 oz (73.9 kg), SpO2 98 %.  General: Pleasant, elderly Caucasian female appearing in NAD. Psych: Normal affect. Neuro: Alert and oriented X 3. Moves all extremities  spontaneously. HEENT: Normal  Neck: Supple without bruits or JVD. Lungs:  Resp regular and unlabored, CTA without wheezing or rales. Heart: RRR no s3, s4, or murmurs. Abdomen: Soft, non-tender, non-distended, BS + x 4.  Extremities: No clubbing, cyanosis or edema. DP/PT/Radials 2+ and equal bilaterally.  Labs    Troponin [facility] of Care Test)  Recent Labs  05/28/16 1422  TROPIPOC 0.00    Recent Labs  05/28/16 2051 05/28/16 2335 05/29/16 0256 05/29/16 0656  TROPONINI <0.03 <0.03 <0.03 <0.03   Lab Results  Component Value Date   WBC 7.7 05/28/2016   HGB 12.6 05/28/2016   HCT 39.7 05/28/2016   MCV 92.5 05/28/2016   PLT 218 05/28/2016    Recent Labs Lab 05/29/16 1006  NA 142  K 4.1  CL  110  CO2 28  BUN 9  CREATININE 0.73  CALCIUM 9.6  GLUCOSE 88   Lab Results  Component Value Date   CHOL 178 05/29/2016   HDL 42 05/29/2016   LDLCALC 94 05/29/2016   TRIG 212 (H) 05/29/2016   No results found for: [facility]   Radiology Studies    Dg Chest 2 View  Result Date: 05/28/2016 CLINICAL DATA:  Chest pain with shortness of breath EXAM: CHEST  2 VIEW COMPARISON:  Chest radiograph September 24, 2011 ; chest CT October 06, 2015 FINDINGS: There is scarring in the apices, stable. There is no edema or consolidation. Heart size and pulmonary vascularity are normal. There is atherosclerotic calcification in the aorta. There is a calcified granuloma in the right hilum, stable. No adenopathy is evident. There is degenerative change in the thoracic spine. IMPRESSION: Evidence of prior granulomatous disease. Bilateral apical scarring. No edema or consolidation. Stable cardiac silhouette. There is aortic atherosclerosis. Electronically Signed   By: Lowella Grip III M.D.   On: 05/28/2016 15:08    EKG & Cardiac Imaging    EKG: NSR, HR 67, with minimal TWI in AVR, V1, and V2 (similar to previous tracings)  Echocardiogram: 07/2011   Assessment & Plan    1. Chest Pain concerning for  Unstable Angina - reports episodes of dizziness with activity for the past several years, but now notes dyspnea and diaphoresis with exertion for the past 1-2 months. Unable to walk around the store or cook a meal without stopping to rest which is new for her.  - developed chest pressure yesterday while mopping the floors. Lasted 15 minutes and resolved with rest.  - cyclic troponin values have been negative. EKG shows NSR, HR 67, with minimal TWI in AVR, V1, and V2 (similar to previous tracings).  - last ischemic evaluation was a NST in 2012 which was normal. Echo at that time showed preserved EF of 60-65% with Grade 1 DD and no wall motion abnormalities. Repeat echo is pending. - discussed NST vs. cardiac catheterization with the patient. With her worsening symptoms and family history of CAD (mother died of MI in her 30's), the patient prefers a definitive evaluation which certainly seems reasonable given her clinical picture. The patient understands that risks included but are not limited to stroke (1 in 1000), death (1 in 85), kidney failure [usually temporary] (1 in 500), bleeding (1 in 200), allergic reaction [possibly serious] (1 in 200). Will add to catheterization board for later today (had breakfast at 0730 but has been NPO since. Lunch held).   2. Dizziness - has been occurring for years. Would check orthostatics prior to discharge.  - event monitor in 2012 was unrevealing. May benefit from referral to ENT if no significant ischemia noted on cath. - continue to monitor on telemetry while admitted.   3. GERD - reports her chest discomfort yesterday was significantly different from previous episodes of GERD. - continue Protonix.    Signed, Jamie Heritage, PA-C 05/29/2016, 11:18 AM Pager: [phone]  I have personally seen and examined this patient with Bernerd Pho, PA-C. I agree with the assessment and plan as outlined above. She is here today with chest pain and dyspnea  that is concerning for unstable angina. Exam shows a well developed female in NAD. CV:RRR without murmurs. Lungs: clear bilaterally. Abd: soft, NT. Ext: no edema. Labs reviewed by me. Troponin negative. EKG reviewed by me with no ischemic changes. NSR.  Will plan cardiac cath with possible  PCI today. Pt is now NPO.   Jamie Beltran 05/29/2016 1:18 PM

## 2016-05-29 NOTE — Interval H&P Note (Signed)
Cath Lab Visit (complete for each Cath Lab visit)  Clinical Evaluation Leading to the Procedure:   ACS: Yes.    Non-ACS:    Anginal Classification: CCS III  Anti-ischemic medical therapy: No Therapy  Non-Invasive Test Results: No non-invasive testing performed  Prior CABG: No previous CABG      History and Physical Interval Note:  05/29/2016 5:21 PM  Katharine Look  has presented today for surgery, with the diagnosis of cp  The various methods of treatment have been discussed with the patient and family. After consideration of risks, benefits and other options for treatment, the patient has consented to  Procedure(s): Left Heart Cath and Coronary Angiography (N/A) as a surgical intervention .  The patient's history has been reviewed, patient examined, no change in status, stable for surgery.  I have reviewed the patient's chart and labs.  Questions were answered to the patient's satisfaction.     Sherren Mocha

## 2016-05-29 NOTE — H&P (View-Only) (Signed)
Cardiology Consult    Patient ID: MEARLE AVILEZ MRN: [id number], DOB/AGE: 79/15/1936   Admit date: 05/28/2016 Date of Consult: 05/29/2016  Primary Physician: Shirline Frees, MD Reason for Consult: Chest Pain Primary Cardiologist: Dr. Marlou Porch Requesting Provider: Dr. Grandville Silos   History of Present Illness    Jamie Beltran is a 81 y.o. female with past medical history of recurrent syncope, GERD, OA, and prior tobacco use who presented to [facility] ED on 05/29/2016 for evaluation of chest pain, dyspnea, and weakness.  The patient reports that for the past few years she has experienced episodes of dizziness with exertion. Over the past few months, these episodes have progressed to having dizziness, diaphoresis, and dyspnea with exertion. When going to the grocery store, she has to stop multiple times due to her progressive dyspnea. Also reports when performing chores around the house or cooking meals, she has to stop and rest. She denies any syncopal events.   Yesterday, she was mopping the floors and developed dyspnea and chest pressure. Says it felt like something was sitting on her chest. She sat down and the pain resolved within 15 minutes. She had a repeat episode that lasted several minutes then resolved. She says the pain has not represented with such intensity but she still has a mild pressure in her sternum at this time.    While admitted, labs have shown a WBC of 7.7, Hgb 12.6, and platelets 218. K+ 4.0. Creatinine 0.82. Initial troponin and cyclic troponin values have been negative. Lipid Panel with total cholesterol of 178, HDL 42, and LDL 94. TSH 6.015. CXR with evidence of prior granulomatous disease, bilateral apical scarring, no edema or consolidation, and aortic atherosclerosis noted. EKG shows NSR, HR 67, with minimal TWI in AVR, V1, and V2 (similar to previous tracings).   Has been followed by Dr. Marlou Porch for recurrent syncope. NST in 07/2011 was low-risk with no evidence of  ischemia. Echo at that time showed preserved EF of 60-65% with Grade 1 DD and no wall motion abnormalities. Wore an event monitor with no significant arrhythmias noted.   Past Medical History   Past Medical History:  Diagnosis Date  . Basal cell carcinoma   . Blood in stool   . Colonic polyp   . Diverticulitis   . Diverticulosis   . Esophageal reflux   . Fibrocystic breast disease   . GERD (gastroesophageal reflux disease) 05/29/2016  . Hiatal hernia   . Hyperlipidemia 05/29/2016  . Hypothyroidism 05/29/2016  . Migraine 05/29/2016  . Migraine headache   . OA (osteoarthritis)    back, hips, knees  . Other and unspecified hyperlipidemia     Past Surgical History:  Procedure Laterality Date  . BLADDER SUSPENSION  1986  . CATARACT EXTRACTION, BILATERAL    . CHOLECYSTECTOMY  2002  . COLONOSCOPY  04/08  . ESOPHAGOGASTRODUODENOSCOPY  9/06, 4/08  . FOOT SURGERY  1990  . HEEL SPUR EXCISION  2001  . KNEE ARTHROSCOPY Bilateral 2006, 2013  . TOTAL ABDOMINAL HYSTERECTOMY W/ BILATERAL SALPINGOOPHORECTOMY  1981  . VEIN LIGATION Left 1980     Allergies  Allergies  Allergen Reactions  . Codeine Other (See Comments)    "Makes me crazy."  . Penicillins Hives and Swelling    Has patient had a PCN reaction causing immediate rash, facial/tongue/throat swelling, SOB or lightheadedness with hypotension: Yes Has patient had a PCN reaction causing severe rash involving mucus membranes or skin necrosis: No Has patient had a PCN reaction  that required hospitalization No Has patient had a PCN reaction occurring within the last 10 years: No If all of the above answers are "NO", then may proceed with Cephalosporin use.   . Adhesive [Tape] Itching    Heart monitor pads  . Protonix [Pantoprazole Sodium] Diarrhea    With 40 mg twice daily, can tolerate 40 mg once daily      Inpatient Medications    . aspirin EC  81 mg Oral BH-q7a  . enoxaparin (LOVENOX) injection  40 mg Subcutaneous QHS  .  gabapentin  300 mg Oral Daily  . [START ON 05/30/2016] levothyroxine  62.5 mcg Oral QAC breakfast  . pantoprazole  40 mg Oral Daily    Family History    Family History  Problem Relation Age of Onset  . Hypertension Father   . Heart attack Mother   . Kidney Stones Sister     kidney problems  . Kidney Stones Sister     kidney transplant    Social History    Social History   Social History  . Marital status: Married    Spouse name: Timmothy Sours  . Number of children: 3  . Years of education: HS   Occupational History  . retired   . RETIRED [employer]   Social History Main Topics  . Smoking status: Former Smoker    Packs/day: 1.00    Years: 20.00    Types: Cigarettes    Quit date: 09/23/1980  . Smokeless tobacco: Never Used  . Alcohol use Yes     Comment: rarely  . Drug use: No  . Sexual activity: Not on file   Other Topics Concern  . Not on file   Social History Narrative   Patient lives at home with spouse.   Caffeine use: 1-2 cups daily     Review of Systems    General:  No chills, fever, night sweats or weight changes.  Cardiovascular:  No edema, orthopnea, palpitations, paroxysmal nocturnal dyspnea. Positive for chest pain and dyspnea with exertion.  Dermatological: No rash, lesions/masses Respiratory: No cough, Positive for dyspnea Urologic: No hematuria, dysuria Abdominal:   No nausea, vomiting, diarrhea, bright red blood per rectum, melena, or hematemesis Neurologic:  No visual changes, wkns, changes in mental status. Positive for dizziness.  All other systems reviewed and are otherwise negative except as noted above.  Physical Exam    Blood pressure (!) 139/58, pulse 67, temperature 97.5 F (36.4 C), temperature source Oral, resp. rate 17, height 5\' 4"  (1.626 m), weight 162 lb 14.4 oz (73.9 kg), SpO2 98 %.  General: Pleasant, elderly Caucasian female appearing in NAD. Psych: Normal affect. Neuro: Alert and oriented X 3. Moves all extremities  spontaneously. HEENT: Normal  Neck: Supple without bruits or JVD. Lungs:  Resp regular and unlabored, CTA without wheezing or rales. Heart: RRR no s3, s4, or murmurs. Abdomen: Soft, non-tender, non-distended, BS + x 4.  Extremities: No clubbing, cyanosis or edema. DP/PT/Radials 2+ and equal bilaterally.  Labs    Troponin [facility] Test)  Recent Labs  05/28/16 1422  TROPIPOC 0.00    Recent Labs  05/28/16 2051 05/28/16 2335 05/29/16 0256 05/29/16 0656  TROPONINI <0.03 <0.03 <0.03 <0.03   Lab Results  Component Value Date   WBC 7.7 05/28/2016   HGB 12.6 05/28/2016   HCT 39.7 05/28/2016   MCV 92.5 05/28/2016   PLT 218 05/28/2016    Recent Labs Lab 05/29/16 1006  NA 142  K 4.1  CL  110  CO2 28  BUN 9  CREATININE 0.73  CALCIUM 9.6  GLUCOSE 88   Lab Results  Component Value Date   CHOL 178 05/29/2016   HDL 42 05/29/2016   LDLCALC 94 05/29/2016   TRIG 212 (H) 05/29/2016   No results found for: [facility]   Radiology Studies    Dg Chest 2 View  Result Date: 05/28/2016 CLINICAL DATA:  Chest pain with shortness of breath EXAM: CHEST  2 VIEW COMPARISON:  Chest radiograph September 24, 2011 ; chest CT October 06, 2015 FINDINGS: There is scarring in the apices, stable. There is no edema or consolidation. Heart size and pulmonary vascularity are normal. There is atherosclerotic calcification in the aorta. There is a calcified granuloma in the right hilum, stable. No adenopathy is evident. There is degenerative change in the thoracic spine. IMPRESSION: Evidence of prior granulomatous disease. Bilateral apical scarring. No edema or consolidation. Stable cardiac silhouette. There is aortic atherosclerosis. Electronically Signed   By: Lowella Grip III M.D.   On: 05/28/2016 15:08    EKG & Cardiac Imaging    EKG: NSR, HR 67, with minimal TWI in AVR, V1, and V2 (similar to previous tracings)  Echocardiogram: 07/2011   Assessment & Plan    1. Chest Pain concerning for  Unstable Angina - reports episodes of dizziness with activity for the past several years, but now notes dyspnea and diaphoresis with exertion for the past 1-2 months. Unable to walk around the store or cook a meal without stopping to rest which is new for her.  - developed chest pressure yesterday while mopping the floors. Lasted 15 minutes and resolved with rest.  - cyclic troponin values have been negative. EKG shows NSR, HR 67, with minimal TWI in AVR, V1, and V2 (similar to previous tracings).  - last ischemic evaluation was a NST in 2012 which was normal. Echo at that time showed preserved EF of 60-65% with Grade 1 DD and no wall motion abnormalities. Repeat echo is pending. - discussed NST vs. cardiac catheterization with the patient. With her worsening symptoms and family history of CAD (mother died of MI in her 31's), the patient prefers a definitive evaluation which certainly seems reasonable given her clinical picture. The patient understands that risks included but are not limited to stroke (1 in 1000), death (1 in 16), kidney failure [usually temporary] (1 in 500), bleeding (1 in 200), allergic reaction [possibly serious] (1 in 200). Will add to catheterization board for later today (had breakfast at 0730 but has been NPO since. Lunch held).   2. Dizziness - has been occurring for years. Would check orthostatics prior to discharge.  - event monitor in 2012 was unrevealing. May benefit from referral to ENT if no significant ischemia noted on cath. - continue to monitor on telemetry while admitted.   3. GERD - reports her chest discomfort yesterday was significantly different from previous episodes of GERD. - continue Protonix.    Signed, Erma Heritage, PA-C 05/29/2016, 11:18 AM Pager: [phone]  I have personally seen and examined this patient with Bernerd Pho, PA-C. I agree with the assessment and plan as outlined above. She is here today with chest pain and dyspnea  that is concerning for unstable angina. Exam shows a well developed female in NAD. CV:RRR without murmurs. Lungs: clear bilaterally. Abd: soft, NT. Ext: no edema. Labs reviewed by me. Troponin negative. EKG reviewed by me with no ischemic changes. NSR.  Will plan cardiac cath with possible  PCI today. Pt is now NPO.   Lauree Chandler 05/29/2016 1:18 PM

## 2016-05-30 ENCOUNTER — Encounter (HOSPITAL_COMMUNITY): Payer: Self-pay | Admitting: Cardiovascular Disease

## 2016-05-30 DIAGNOSIS — R072 Precordial pain: Secondary | ICD-10-CM

## 2016-05-30 DIAGNOSIS — E039 Hypothyroidism, unspecified: Secondary | ICD-10-CM | POA: Diagnosis not present

## 2016-05-30 DIAGNOSIS — R079 Chest pain, unspecified: Secondary | ICD-10-CM | POA: Diagnosis not present

## 2016-05-30 DIAGNOSIS — E785 Hyperlipidemia, unspecified: Secondary | ICD-10-CM | POA: Diagnosis not present

## 2016-05-30 DIAGNOSIS — K219 Gastro-esophageal reflux disease without esophagitis: Secondary | ICD-10-CM | POA: Diagnosis not present

## 2016-05-30 LAB — HEMOGLOBIN A1C
HEMOGLOBIN A1C: 5.5 % (ref 4.8–5.6)
MEAN PLASMA GLUCOSE: 111 mg/dL

## 2016-05-30 MED ORDER — PANTOPRAZOLE SODIUM 40 MG PO TBEC: 40.0000 mg | DELAYED_RELEASE_TABLET | Freq: Every day | ORAL | 3 refills | Status: DC

## 2016-05-30 MED ORDER — LEVOTHYROXINE SODIUM 125 MCG PO TABS: 62.5000 ug | ORAL_TABLET | Freq: Every day | ORAL | 3 refills | Status: DC

## 2016-05-30 MED ORDER — GI COCKTAIL ~~LOC~~: 30.0000 mL | Freq: Three times a day (TID) | ORAL | 0 refills | Status: DC | PRN

## 2016-05-30 NOTE — Discharge Summary (Signed)
Physician Discharge Summary  Jamie Beltran [id number] DOB: May 28, 1935 DOA: 05/28/2016  PCP: Shirline Frees, MD  Admit date: 05/28/2016 Discharge date: 05/30/2016  Time spent: 60 minutes  Recommendations for Outpatient Follow-up:  1. Follow-up with Shirline Frees, MD in 1-2 weeks. On follow-up if patient continues to have chest pain may consider referral to outpatient gastroenterology for possible upper endoscopy and further evaluation. Patient will also need repeat thyroid function studies done in about 4-6 weeks as patient's Synthroid dose was adjusted and increased to 62.53mcg daily.   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 Symptoms started since the morning of admission. Symptoms had rapidly resolved. Patient was doing activity that time mopping, 1 pain was substernal worse with exertion felt like pressure/and radiating to left shoulder not associated with abdominal pain no fevers or chills she rested and that seemed to make things much better she has no prior history of coronary artery disease or diabetes. Patient reported she had some episodes like this in the past usually she does better with rest. She has had much lighter episodes with  exertion but never that severe.   She does water aerobics and walks the dog every morning she gets tired but no chest pain.    Regarding pertinent Chronic problems: In 2012 patient was evaluated for lightheadedness at that time was thought to be secondary to vasovagal component she had carotid ultrasound done which was reassuring nuclear stress test was low risk, echo showed normal EF,  She had DVT associated with ankle fracture in 2015 and was treated with several total Hospital Course:  #1 chest pain Patient presented with chest pain with typical features as patient describes chest pain as a pressure sensation with associated shortness of breath and diaphoresis improved on nitroglycerin. Patient with some clinical improvement. Cardiac enzymes negative 3. Fasting lipid panel with LDL of 94. TSH was 6.015. 2-D echo which was done had a EF of 60-65% with no wall motion abnormalities, grade 1 diastolic dysfunction. 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 It was felt patient's chest pain was likely noncardiac in nature. Patient improved clinically. Patient be discharged in a PPI daily as well as a GI cocktail when necessary. Outpatient follow-up. pending.  #2 hypothyroidism TSH at 6.015. Increased Synthroid to 62.5 mcg daily. Repeat thyroid function studies in 4-6 weeks.  #3 gastroesophageal reflux disease  Patient kept on PPI.  #4 hyperlipidemia Patient with a total cholesterol of 178. LDL of 94. 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 Continue diet and exercise. Outpatient  follow-up.   #5 migraine headaches Stable.  Procedures:  2-D echo 05/29/2016  Cardiac catheterization 05/29/2016--- per Dr.  Burt Knack.  Consultations:  Cardiology: Dr. Angelena Form 05/29/2016    Discharge Exam: Vitals:   05/29/16 2100 05/30/16 0521  BP: (!) 123/50 (!) 135/59  Pulse: 70 69  Resp: 18 18  Temp:  98.1 F (36.7 C)    General: NAD Cardiovascular: RRR Respiratory: CTAB  Discharge Instructions   Discharge Instructions    Diet general    Complete by:  As directed   Discharge instructions    Complete by:  As directed   Follow up with Shirline Frees, MD in 1-2 weeks.   Increase activity slowly    Complete by:  As directed     Current Discharge Medication List    START taking these medications   Details  Alum & Mag Hydroxide-Simeth (GI COCKTAIL) SUSP suspension Take 30 mLs by mouth 3 (three) times daily as needed for indigestion. Shake well. Qty: 900 mL, Refills: 0      CONTINUE these medications which have CHANGED   Details  levothyroxine (SYNTHROID, LEVOTHROID) 125 MCG tablet Take 0.5 tablets (62.5 mcg total) by mouth daily before breakfast. Qty: 30 tablet, Refills: 3    pantoprazole (PROTONIX) 40 MG tablet Take 1 tablet (40 mg total) by mouth daily. Qty: 30 tablet, Refills: 3      CONTINUE these medications which have NOT CHANGED   Details  aspirin EC 81 MG tablet Take 81 mg by mouth every morning.    gabapentin (NEURONTIN) 300 MG capsule Take 300 mg by mouth every morning.     HYDROcodone-acetaminophen (NORCO) 10-325 MG per tablet Take 1 tablet by mouth every 6 (six) hours as needed for moderate pain.        Allergies  Allergen Reactions  . Codeine Other (See Comments)    "Makes me crazy."  . Penicillins Hives and Swelling    Has patient had a PCN reaction causing immediate rash, facial/tongue/throat swelling, SOB or lightheadedness with hypotension: Yes Has patient had a PCN reaction causing severe rash involving mucus membranes or skin necrosis: No Has patient had a PCN reaction that required hospitalization No Has patient had a PCN reaction occurring within the last  10 years: No If all of the above answers are "NO", then may proceed with Cephalosporin use.   . Adhesive [Tape] Itching    Heart monitor pads  . Protonix [Pantoprazole Sodium] Diarrhea    With 40 mg twice daily, can tolerate 40 mg once daily     Follow-up Information    Shirline Frees, MD. Schedule an appointment as soon as possible for a visit in 2 week(s).   Specialty:  Family Medicine Contact information: [address] [place] [address] [phone]            The results of significant diagnostics from this hospitalization (including imaging, microbiology, ancillary and laboratory) are listed below for reference.    Significant Diagnostic Studies: Dg Chest 2 View  Result Date: 05/28/2016 CLINICAL DATA:  Chest pain with shortness of breath EXAM: CHEST  2 VIEW COMPARISON:  Chest radiograph September 24, 2011 ; chest CT October 06, 2015 FINDINGS: There is scarring in the apices, stable. There is no edema or consolidation. Heart size and pulmonary vascularity are normal. There is atherosclerotic calcification in the aorta. There is a calcified granuloma in the right hilum, stable. No adenopathy is evident. There is degenerative change in the thoracic spine. IMPRESSION: Evidence  of prior granulomatous disease. Bilateral apical scarring. No edema or consolidation. Stable cardiac silhouette. There is aortic atherosclerosis. Electronically Signed   By: Lowella Grip III M.D.   On: 05/28/2016 15:08    Microbiology: No results found for this or any previous visit (from the past 240 hour(s)).   Labs: Basic Metabolic Panel:  Recent Labs Lab 05/28/16 1402 05/29/16 1006  NA 138 142  K 4.0 4.1  CL 107 110  CO2 26 28  GLUCOSE 144* 88  BUN 11 9  CREATININE 0.82 0.73  CALCIUM 9.2 9.6   Liver Function Tests: No results for input(s): AST, ALT, ALKPHOS, BILITOT, PROT, ALBUMIN in the last 168 hours. No results for input(s): LIPASE, AMYLASE in the last 168  hours. No results for input(s): AMMONIA in the last 168 hours. CBC:  Recent Labs Lab 05/28/16 1402  WBC 7.7  HGB 12.6  HCT 39.7  MCV 92.5  PLT 218   Cardiac Enzymes:  Recent Labs Lab 05/28/16 2051 05/28/16 2335 05/29/16 0256 05/29/16 0656  TROPONINI <0.03 <0.03 <0.03 <0.03   BNP: BNP (last 3 results) No results for input(s): BNP in the last 8760 hours.  ProBNP (last 3 results) No results for input(s): PROBNP in the last 8760 hours.  CBG: No results for input(s): GLUCAP in the last 168 hours.     SignedIrine Seal MD.  [facility] 05/30/2016, 11:52 AM

## 2016-05-30 NOTE — Progress Notes (Signed)
     SUBJECTIVE: No chest pain or dyspnea  Tele: sinus  BP (!) 135/59 (BP Location: Left Arm)   Pulse 69   Temp 98.1 F (36.7 C) (Oral)   Resp 18   Ht 5\' 4"  (1.626 m)   Wt 162 lb 14.7 oz (73.9 kg)   SpO2 97%   BMI 27.97 kg/m   Intake/Output Summary (Last 24 hours) at 05/30/16 0916 Last data filed at 05/30/16 0800  Gross per 24 hour  Intake           716.48 ml  Output                0 ml  Net           716.48 ml    PHYSICAL EXAM General: Well developed, well nourished, in no acute distress. Alert and oriented x 3.  Psych:  Good affect, responds appropriately Neck: No JVD. No masses noted.  Lungs: Clear bilaterally with no wheezes or rhonci noted.  Heart: RRR with no murmurs noted. Abdomen: Bowel sounds are present. Soft, non-tender.  Extremities: No lower extremity edema. Right wrist with bruising, no hematoma.   LABS: Basic Metabolic Panel:  Recent Labs  05/28/16 1402 05/29/16 1006  NA 138 142  K 4.0 4.1  CL 107 110  CO2 26 28  GLUCOSE 144* 88  BUN 11 9  CREATININE 0.82 0.73  CALCIUM 9.2 9.6   CBC:  Recent Labs  05/28/16 1402  WBC 7.7  HGB 12.6  HCT 39.7  MCV 92.5  PLT 218   Cardiac Enzymes:  Recent Labs  05/28/16 2335 05/29/16 0256 05/29/16 0656  TROPONINI <0.03 <0.03 <0.03   Fasting Lipid Panel:  Recent Labs  05/29/16 0256  CHOL 178  HDL 42  LDLCALC 94  TRIG 212*  CHOLHDL 4.2    Current Meds: . aspirin EC  81 mg Oral BH-q7a  . enoxaparin (LOVENOX) injection  40 mg Subcutaneous QHS  . gabapentin  300 mg Oral Daily  . levothyroxine  62.5 mcg Oral QAC breakfast  . pantoprazole  40 mg Oral Daily  . sodium chloride flush  3 mL Intravenous Q12H     ASSESSMENT AND PLAN:  1. Chest pain: She has normal coronary arteries by cath 05/29/16. Her filling pressures are normal. Her dyspnea and chest pain are not cardiac. LV function is normal. No further cardiac workup. This could be GERD with component of reflux induce asthma.      Lauree Chandler  9/7/20179:16 AM

## 2016-05-31 MED FILL — Heparin Sodium (Porcine) 2 Unit/ML in Sodium Chloride 0.9%: INTRAMUSCULAR | Qty: 500 | Status: AC

## 2016-10-01 DIAGNOSIS — L570 Actinic keratosis: Secondary | ICD-10-CM | POA: Diagnosis not present

## 2016-10-01 DIAGNOSIS — L812 Freckles: Secondary | ICD-10-CM | POA: Diagnosis not present

## 2016-10-01 DIAGNOSIS — L821 Other seborrheic keratosis: Secondary | ICD-10-CM | POA: Diagnosis not present

## 2016-10-18 DIAGNOSIS — M545 Low back pain: Secondary | ICD-10-CM | POA: Diagnosis not present

## 2016-10-18 DIAGNOSIS — E039 Hypothyroidism, unspecified: Secondary | ICD-10-CM | POA: Diagnosis not present

## 2016-10-18 DIAGNOSIS — M7062 Trochanteric bursitis, left hip: Secondary | ICD-10-CM | POA: Diagnosis not present

## 2016-10-18 DIAGNOSIS — K219 Gastro-esophageal reflux disease without esophagitis: Secondary | ICD-10-CM | POA: Diagnosis not present

## 2016-10-18 DIAGNOSIS — M170 Bilateral primary osteoarthritis of knee: Secondary | ICD-10-CM | POA: Diagnosis not present

## 2016-10-18 DIAGNOSIS — E7800 Pure hypercholesterolemia, unspecified: Secondary | ICD-10-CM | POA: Diagnosis not present

## 2016-10-30 DIAGNOSIS — H16223 Keratoconjunctivitis sicca, not specified as Sjogren's, bilateral: Secondary | ICD-10-CM | POA: Diagnosis not present

## 2016-10-30 DIAGNOSIS — H0014 Chalazion left upper eyelid: Secondary | ICD-10-CM | POA: Diagnosis not present

## 2016-10-30 DIAGNOSIS — H43813 Vitreous degeneration, bilateral: Secondary | ICD-10-CM | POA: Diagnosis not present

## 2016-10-30 DIAGNOSIS — H0015 Chalazion left lower eyelid: Secondary | ICD-10-CM | POA: Diagnosis not present

## 2016-10-30 DIAGNOSIS — Z961 Presence of intraocular lens: Secondary | ICD-10-CM | POA: Diagnosis not present

## 2016-11-28 ENCOUNTER — Other Ambulatory Visit: Payer: Self-pay | Admitting: Family Medicine

## 2016-11-28 DIAGNOSIS — R911 Solitary pulmonary nodule: Secondary | ICD-10-CM

## 2016-12-19 ENCOUNTER — Other Ambulatory Visit: Payer: Medicare Other

## 2016-12-20 ENCOUNTER — Ambulatory Visit
Admission: RE | Admit: 2016-12-20 | Discharge: 2016-12-20 | Disposition: A | Discharge status: Home / Self Care | Payer: PPO | Source: Ambulatory Visit | Attending: Family Medicine | Admitting: Family Medicine

## 2016-12-20 DIAGNOSIS — R918 Other nonspecific abnormal finding of lung field: Secondary | ICD-10-CM | POA: Diagnosis not present

## 2016-12-20 DIAGNOSIS — R911 Solitary pulmonary nodule: Secondary | ICD-10-CM

## 2017-02-21 DIAGNOSIS — M4319 Spondylolisthesis, multiple sites in spine: Secondary | ICD-10-CM | POA: Diagnosis not present

## 2017-02-21 DIAGNOSIS — M47816 Spondylosis without myelopathy or radiculopathy, lumbar region: Secondary | ICD-10-CM | POA: Diagnosis not present

## 2017-03-06 ENCOUNTER — Other Ambulatory Visit: Payer: Self-pay | Admitting: Neurosurgery

## 2017-03-06 DIAGNOSIS — M47816 Spondylosis without myelopathy or radiculopathy, lumbar region: Secondary | ICD-10-CM

## 2017-03-14 ENCOUNTER — Ambulatory Visit
Admission: RE | Admit: 2017-03-14 | Discharge: 2017-03-14 | Disposition: A | Discharge status: Home / Self Care | Payer: PPO | Source: Ambulatory Visit | Attending: Neurosurgery | Admitting: Neurosurgery

## 2017-03-14 DIAGNOSIS — M47816 Spondylosis without myelopathy or radiculopathy, lumbar region: Secondary | ICD-10-CM

## 2017-03-14 DIAGNOSIS — M48061 Spinal stenosis, lumbar region without neurogenic claudication: Secondary | ICD-10-CM | POA: Diagnosis not present

## 2017-03-28 DIAGNOSIS — M47816 Spondylosis without myelopathy or radiculopathy, lumbar region: Secondary | ICD-10-CM | POA: Diagnosis not present

## 2017-03-28 DIAGNOSIS — M4319 Spondylolisthesis, multiple sites in spine: Secondary | ICD-10-CM | POA: Diagnosis not present

## 2017-04-17 DIAGNOSIS — K219 Gastro-esophageal reflux disease without esophagitis: Secondary | ICD-10-CM | POA: Diagnosis not present

## 2017-04-17 DIAGNOSIS — E039 Hypothyroidism, unspecified: Secondary | ICD-10-CM | POA: Diagnosis not present

## 2017-04-17 DIAGNOSIS — E7800 Pure hypercholesterolemia, unspecified: Secondary | ICD-10-CM | POA: Diagnosis not present

## 2017-04-17 DIAGNOSIS — G894 Chronic pain syndrome: Secondary | ICD-10-CM | POA: Diagnosis not present

## 2017-04-17 DIAGNOSIS — M170 Bilateral primary osteoarthritis of knee: Secondary | ICD-10-CM | POA: Diagnosis not present

## 2017-04-17 DIAGNOSIS — N952 Postmenopausal atrophic vaginitis: Secondary | ICD-10-CM | POA: Diagnosis not present

## 2017-07-08 DIAGNOSIS — Z23 Encounter for immunization: Secondary | ICD-10-CM | POA: Diagnosis not present

## 2017-07-17 DIAGNOSIS — M47816 Spondylosis without myelopathy or radiculopathy, lumbar region: Secondary | ICD-10-CM | POA: Diagnosis not present

## 2017-09-23 DIAGNOSIS — C50919 Malignant neoplasm of unspecified site of unspecified female breast: Secondary | ICD-10-CM

## 2017-09-23 HISTORY — PX: MASTECTOMY: SHX3

## 2017-09-23 HISTORY — DX: Malignant neoplasm of unspecified site of unspecified female breast: C50.919

## 2017-09-29 DIAGNOSIS — K219 Gastro-esophageal reflux disease without esophagitis: Secondary | ICD-10-CM | POA: Diagnosis not present

## 2017-09-29 DIAGNOSIS — N6320 Unspecified lump in the left breast, unspecified quadrant: Secondary | ICD-10-CM | POA: Diagnosis not present

## 2017-10-01 ENCOUNTER — Other Ambulatory Visit: Payer: Self-pay | Admitting: Physician Assistant

## 2017-10-01 DIAGNOSIS — N6320 Unspecified lump in the left breast, unspecified quadrant: Secondary | ICD-10-CM

## 2017-10-07 ENCOUNTER — Ambulatory Visit
Admission: RE | Admit: 2017-10-07 | Discharge: 2017-10-07 | Disposition: A | Discharge status: Home / Self Care | Payer: PPO | Source: Ambulatory Visit | Attending: Physician Assistant | Admitting: Physician Assistant

## 2017-10-07 ENCOUNTER — Other Ambulatory Visit: Payer: Self-pay | Admitting: Physician Assistant

## 2017-10-07 DIAGNOSIS — N6320 Unspecified lump in the left breast, unspecified quadrant: Secondary | ICD-10-CM

## 2017-10-07 DIAGNOSIS — N6489 Other specified disorders of breast: Secondary | ICD-10-CM | POA: Diagnosis not present

## 2017-10-07 DIAGNOSIS — R922 Inconclusive mammogram: Secondary | ICD-10-CM | POA: Diagnosis not present

## 2017-10-07 DIAGNOSIS — R921 Mammographic calcification found on diagnostic imaging of breast: Secondary | ICD-10-CM

## 2017-10-13 ENCOUNTER — Ambulatory Visit
Admission: RE | Admit: 2017-10-13 | Discharge: 2017-10-13 | Disposition: A | Discharge status: Home / Self Care | Payer: PPO | Source: Ambulatory Visit | Attending: Physician Assistant | Admitting: Physician Assistant

## 2017-10-13 ENCOUNTER — Other Ambulatory Visit: Payer: Self-pay | Admitting: Physician Assistant

## 2017-10-13 DIAGNOSIS — R921 Mammographic calcification found on diagnostic imaging of breast: Secondary | ICD-10-CM

## 2017-10-13 DIAGNOSIS — N6320 Unspecified lump in the left breast, unspecified quadrant: Secondary | ICD-10-CM

## 2017-10-13 DIAGNOSIS — C50212 Malignant neoplasm of upper-inner quadrant of left female breast: Secondary | ICD-10-CM | POA: Diagnosis not present

## 2017-10-13 DIAGNOSIS — N6322 Unspecified lump in the left breast, upper inner quadrant: Secondary | ICD-10-CM | POA: Diagnosis not present

## 2017-10-13 DIAGNOSIS — D0512 Intraductal carcinoma in situ of left breast: Secondary | ICD-10-CM | POA: Diagnosis not present

## 2017-10-14 DIAGNOSIS — K219 Gastro-esophageal reflux disease without esophagitis: Secondary | ICD-10-CM | POA: Diagnosis not present

## 2017-10-14 DIAGNOSIS — E039 Hypothyroidism, unspecified: Secondary | ICD-10-CM | POA: Diagnosis not present

## 2017-10-14 DIAGNOSIS — G894 Chronic pain syndrome: Secondary | ICD-10-CM | POA: Diagnosis not present

## 2017-10-14 DIAGNOSIS — E7800 Pure hypercholesterolemia, unspecified: Secondary | ICD-10-CM | POA: Diagnosis not present

## 2017-10-15 ENCOUNTER — Telehealth: Payer: Self-pay | Admitting: Hematology and Oncology

## 2017-10-15 ENCOUNTER — Encounter: Payer: Self-pay | Admitting: *Deleted

## 2017-10-15 NOTE — Telephone Encounter (Signed)
Spoke with patient to confirm afternoon [facility] appointment for 10/22/17, will mail packet to patient

## 2017-10-20 ENCOUNTER — Other Ambulatory Visit: Payer: Self-pay | Admitting: *Deleted

## 2017-10-20 DIAGNOSIS — Z170 Estrogen receptor positive status [ER+]: Principal | ICD-10-CM

## 2017-10-20 DIAGNOSIS — C50212 Malignant neoplasm of upper-inner quadrant of left female breast: Secondary | ICD-10-CM | POA: Insufficient documentation

## 2017-10-22 ENCOUNTER — Ambulatory Visit: Payer: PPO | Admitting: Physical Therapy

## 2017-10-22 ENCOUNTER — Inpatient Hospital Stay: Payer: PPO

## 2017-10-22 ENCOUNTER — Encounter: Payer: Self-pay | Admitting: Radiation Oncology

## 2017-10-22 ENCOUNTER — Ambulatory Visit: Payer: Self-pay | Admitting: General Surgery

## 2017-10-22 ENCOUNTER — Ambulatory Visit
Admission: RE | Admit: 2017-10-22 | Discharge: 2017-10-22 | Disposition: A | Discharge status: Home / Self Care | Payer: PPO | Source: Ambulatory Visit | Attending: Radiation Oncology | Admitting: Radiation Oncology

## 2017-10-22 ENCOUNTER — Inpatient Hospital Stay: Payer: PPO | Attending: Hematology and Oncology | Admitting: Hematology and Oncology

## 2017-10-22 ENCOUNTER — Encounter: Payer: Self-pay | Admitting: Hematology and Oncology

## 2017-10-22 VITALS — BP 115/85 | HR 82 | Temp 97.6°F | Resp 18 | Ht 64.0 in | Wt 164.1 lb

## 2017-10-22 DIAGNOSIS — C50212 Malignant neoplasm of upper-inner quadrant of left female breast: Secondary | ICD-10-CM

## 2017-10-22 DIAGNOSIS — Z170 Estrogen receptor positive status [ER+]: Secondary | ICD-10-CM | POA: Insufficient documentation

## 2017-10-22 DIAGNOSIS — C50912 Malignant neoplasm of unspecified site of left female breast: Secondary | ICD-10-CM | POA: Diagnosis not present

## 2017-10-22 DIAGNOSIS — Z780 Asymptomatic menopausal state: Secondary | ICD-10-CM

## 2017-10-22 LAB — CBC WITH DIFFERENTIAL (CANCER CENTER ONLY)
BASOS PCT: 1 %
Basophils Absolute: 0 10*3/uL (ref 0.0–0.1)
Eosinophils Absolute: 0.3 10*3/uL (ref 0.0–0.5)
Eosinophils Relative: 4 %
HEMATOCRIT: 40.8 % (ref 34.8–46.6)
Hemoglobin: 13 g/dL (ref 11.6–15.9)
Lymphocytes Relative: 31 %
Lymphs Abs: 2 10*3/uL (ref 0.9–3.3)
MCH: 29.2 pg (ref 25.1–34.0)
MCHC: 31.9 g/dL (ref 31.5–36.0)
MCV: 91.7 fL (ref 79.5–101.0)
MONO ABS: 0.7 10*3/uL (ref 0.1–0.9)
Monocytes Relative: 11 %
NEUTROS ABS: 3.6 10*3/uL (ref 1.5–6.5)
NEUTROS PCT: 53 %
Platelet Count: 183 10*3/uL (ref 145–400)
RBC: 4.45 MIL/uL (ref 3.70–5.45)
RDW: 14.5 % (ref 11.2–14.5)
WBC Count: 6.6 10*3/uL (ref 3.9–10.3)

## 2017-10-22 LAB — CMP (CANCER CENTER ONLY)
ALK PHOS: 80 U/L (ref 40–150)
ALT: 23 U/L (ref 0–55)
ANION GAP: 10 (ref 3–11)
AST: 20 U/L (ref 5–34)
Albumin: 4 g/dL (ref 3.5–5.0)
BILIRUBIN TOTAL: 1 mg/dL (ref 0.2–1.2)
BUN: 14 mg/dL (ref 7–26)
CALCIUM: 9.6 mg/dL (ref 8.4–10.4)
CO2: 26 mmol/L (ref 22–29)
Chloride: 103 mmol/L (ref 98–109)
Creatinine: 0.94 mg/dL (ref 0.60–1.10)
GFR, Estimated: 56 mL/min — ABNORMAL LOW (ref >60)
GLUCOSE: 127 mg/dL (ref 70–140)
Potassium: 4.3 mmol/L (ref 3.5–5.1)
Sodium: 139 mmol/L (ref 136–145)
TOTAL PROTEIN: 7.4 g/dL (ref 6.4–8.3)

## 2017-10-22 NOTE — Progress Notes (Signed)
[facility] CONSULT NOTE  Patient Care Team: Shirline Frees, MD as PCP - General (Family Medicine)  CHIEF COMPLAINTS/PURPOSE OF CONSULTATION:  Newly diagnosed breast cancer  HISTORY OF PRESENTING ILLNESS:  Jamie Beltran 83 y.o. female is here because of recent diagnosis of left breast cancer with DCIS.  Patient felt a lump in the left breast and brought her to the attention of her doctors.  She underwent a mammogram which led to additional studies including ultrasound and biopsy.  The imaging studies revealed that a primary palpable tumor was 1.5 cm at 9 o'clock position, there was a satellite nodule of 1 cm and an additional 3 masses that were 5 mm 4 mm and 4 mm respectively which were not biopsy.  These are possibly benign fibrocystic changes.  Biopsy of the calcifications revealed DCIS that is ER PR positive and the biopsy of the mass revealed invasive ductal carcinoma grade 2 that is ER PR positive and HER-2 negative with a Ki-67 of 30%.  She is accompanied by her son and daughter.  I reviewed her records extensively and collaborated the history with the patient.  SUMMARY OF ONCOLOGIC HISTORY:   Malignant neoplasm of upper-inner quadrant of left breast in female, estrogen receptor positive ([place])   10/13/2017 Initial Diagnosis    Left breast palpable mass and calcifications which are lateral and anterior to the mass, ultrasound 1.5 cm at 9:00, satellite mass, 3 additional masses 5 mm, 4 mm, 4 mm not biopsied.  Biopsy of calcifications: DCIS ER 100%, PR 100%; biopsy of the mass IDC grade 2 with DCIS ER 90%, PR 100%, Ki-67 30%, HER-2 negative ratio 1.83, T1 cN0 stage I a clinical stage       MEDICAL HISTORY:  Past Medical History:  Diagnosis Date  . Basal cell carcinoma   . Blood in stool   . Colonic polyp   . Diverticulitis   . Diverticulosis   . Esophageal reflux   . Fibrocystic breast disease   . GERD (gastroesophageal reflux disease) 05/29/2016  . Hiatal hernia    . Hyperlipidemia 05/29/2016  . Hypothyroidism 05/29/2016  . Migraine 05/29/2016  . Migraine headache   . OA (osteoarthritis)    back, hips, knees  . Other and unspecified hyperlipidemia     SURGICAL HISTORY: Past Surgical History:  Procedure Laterality Date  . BLADDER SUSPENSION  1986  . CARDIAC CATHETERIZATION N/A 05/29/2016   Procedure: Left Heart Cath and Coronary Angiography;  Surgeon: Sherren Mocha, MD;  Location: [facility];  Service: Cardiovascular;  Laterality: N/A;  . CATARACT EXTRACTION, BILATERAL    . CHOLECYSTECTOMY  2002  . COLONOSCOPY  04/08  . ESOPHAGOGASTRODUODENOSCOPY  9/06, 4/08  . FOOT SURGERY  1990  . HEEL SPUR EXCISION  2001  . KNEE ARTHROSCOPY Bilateral 2006, 2013  . TOTAL ABDOMINAL HYSTERECTOMY W/ BILATERAL SALPINGOOPHORECTOMY  1981  . VEIN LIGATION Left 1980    SOCIAL HISTORY: Social History   Socioeconomic History  . Marital status: Married    Spouse name: Timmothy Sours  . Number of children: 3  . Years of education: HS  . Highest education level: Not on file  Social Needs  . Financial resource strain: Not on file  . Food insecurity - worry: Not on file  . Food insecurity - inability: Not on file  . Transportation needs - medical: Not on file  . Transportation needs - non-medical: Not on file  Occupational History  . Occupation: retired  . Occupation: RETIRED  Employer: [employer]  Tobacco Use  . Smoking status: Former Smoker    Packs/day: 1.00    Years: 20.00    Pack years: 20.00    Types: Cigarettes    Last attempt to quit: 09/23/1980    Years since quitting: 37.1  . Smokeless tobacco: Never Used  Substance and Sexual Activity  . Alcohol use: Yes    Comment: rarely  . Drug use: No  . Sexual activity: Not on file  Other Topics Concern  . Not on file  Social History Narrative   Patient lives at home with spouse.   Caffeine use: 1-2 cups daily    FAMILY HISTORY: Family History  Problem Relation Age of Onset  . Hypertension Father   .  Heart attack Mother   . Kidney Stones Sister        kidney problems  . Kidney Stones Sister        kidney transplant  . Breast cancer Maternal Aunt   . Lung cancer Maternal Aunt     ALLERGIES:  is allergic to codeine; penicillins; adhesive [tape]; and protonix [pantoprazole sodium].  MEDICATIONS:  Current Outpatient Medications  Medication Sig Dispense Refill  . diclofenac sodium (VOLTAREN) 1 % GEL Apply topically as needed.    . gabapentin (NEURONTIN) 300 MG capsule Take 300 mg by mouth every morning.     Marland Kitchen HYDROcodone-acetaminophen (NORCO) 10-325 MG per tablet Take 1 tablet by mouth every 6 (six) hours as needed for moderate pain.     Marland Kitchen levothyroxine (SYNTHROID, LEVOTHROID) 125 MCG tablet Take 0.5 tablets (62.5 mcg total) by mouth daily before breakfast. 30 tablet 3  . Multiple Vitamins-Minerals (HAIR/SKIN/NAILS) CAPS Take by mouth daily.    Marland Kitchen omeprazole (PRILOSEC) 40 MG capsule Take 40 mg by mouth daily.    . ranitidine (ZANTAC) 300 MG capsule Take 300 mg by mouth every evening.     No current facility-administered medications for this visit.     REVIEW OF SYSTEMS:   Constitutional: Denies fevers, chills or abnormal night sweats Eyes: Denies blurriness of vision, double vision or watery eyes Ears, nose, mouth, throat, and face: Denies mucositis or sore throat Respiratory: Denies cough, dyspnea or wheezes Cardiovascular: Denies palpitation, chest discomfort or lower extremity swelling Gastrointestinal:  Denies nausea, heartburn or change in bowel habits Skin: Denies abnormal skin rashes Lymphatics: Denies new lymphadenopathy or easy bruising Neurological:Denies numbness, tingling or new weaknesses Behavioral/Psych: Mood is stable, no new changes  Breast: Palpable lump in the left breast All other systems were reviewed with the patient and are negative.  PHYSICAL EXAMINATION: ECOG PERFORMANCE STATUS: 1 - Symptomatic but completely ambulatory  Vitals:   10/22/17 1240  BP:  115/85  Pulse: 82  Resp: 18  Temp: 97.6 F (36.4 C)  SpO2: 99%   Filed Weights   10/22/17 1240  Weight: 164 lb 1.6 oz (74.4 kg)    GENERAL:alert, no distress and comfortable SKIN: skin color, texture, turgor are normal, no rashes or significant lesions EYES: normal, conjunctiva are pink and non-injected, sclera clear OROPHARYNX:no exudate, no erythema and lips, buccal mucosa, and tongue normal  NECK: supple, thyroid normal size, non-tender, without nodularity LYMPH:  no palpable lymphadenopathy in the cervical, axillary or inguinal LUNGS: clear to auscultation and percussion with normal breathing effort HEART: regular rate & rhythm and no murmurs and no lower extremity edema ABDOMEN:abdomen soft, non-tender and normal bowel sounds Musculoskeletal:no cyanosis of digits and no clubbing  PSYCH: alert & oriented x 3 with fluent speech NEURO:  no focal motor/sensory deficits BREAST: Palpable lump in the left breast. No palpable axillary or supraclavicular lymphadenopathy (exam performed in the presence of a chaperone)   LABORATORY DATA:  I have reviewed the data as listed Lab Results  Component Value Date   WBC 6.6 10/22/2017   HGB 12.6 05/28/2016   HCT 40.8 10/22/2017   MCV 91.7 10/22/2017   PLT 183 10/22/2017   Lab Results  Component Value Date   NA 139 10/22/2017   K 4.3 10/22/2017   CL 103 10/22/2017   CO2 26 10/22/2017    RADIOGRAPHIC STUDIES: I have personally reviewed the radiological reports and agreed with the findings in the report.  ASSESSMENT AND PLAN:  Malignant neoplasm of upper-inner quadrant of left breast in female, estrogen receptor positive ([place]) L 10/13/2017: Eft breast palpable mass and calcifications which are lateral and anterior to the mass, ultrasound 1.5 cm at 9:00, satellite mass, 3 additional masses 5 mm, 4 mm, 4 mm not biopsied.  Biopsy of calcifications: DCIS ER 100%, PR 100%; biopsy of the mass IDC grade 2 with DCIS ER 90%, PR 100%, Ki-67 30%,  HER-2 negative ratio 1.83, T1 cN0 stage I a clinical stage  Pathology and radiology counseling:Discussed with the patient, the details of pathology including the type of breast cancer,the clinical staging, the significance of ER, PR and HER-2/neu receptors and the implications for treatment. After reviewing the pathology in detail, we proceeded to discuss the different treatment options between surgery, and antiestrogen therapies.  Recommendations: 1.  Mastectomy followed by 2. Adjuvant antiestrogen therapy I ordered a bone density test because she has not had a bone density in several years.  Follow-up after surgery to discuss the final pathology report and to start antiestrogen therapy.   All questions were answered. The patient knows to call the clinic with any problems, questions or concerns.    Harriette Ohara, MD 10/22/17

## 2017-10-22 NOTE — Progress Notes (Signed)
Nutrition Assessment  Reason for Assessment:  Pt seen in Breast Clinic  ASSESSMENT:   83 year old female with new diagnosis of breast cancer.  Past medical history of GERD, HLD.    Patient reports normal appetite  Medications:  MVI  Labs: reviewed  Anthropometrics:   Height: 64 inches Weight: 164 lb BMI: 28   NUTRITION DIAGNOSIS: Food and nutrition related knowledge deficit related to new diagnosis of breast cancer as evidenced by no prior need for nutrition related information.  INTERVENTION:   Discussed and provided packet of information regarding nutritional tips for breast cancer patients.  Questions answered.  Teachback method used.  Contact information provided and patient knows to contact me with questions/concerns.    MONITORING, EVALUATION, and GOAL: Pt will consume a healthy plant based diet to maintain lean body mass throughout treatment.   Brallan Denio B. Zenia Resides, [place], [place] Registered Dietitian [phone] (pager)

## 2017-10-22 NOTE — Progress Notes (Signed)
Radiation Oncology         [phone] ________________________________  Initial outpatient Consultation  Name: Jamie Beltran MRN: [id number]  Date: 10/22/2017  DOB: [birth year]  RC:[id number], Gwyndolyn Saxon, MD  Excell Seltzer, MD   REFERRING PHYSICIAN: Excell Seltzer, MD  DIAGNOSIS:    ICD-10-CM   1. 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 female who presented with a palpable mass in her left breast. On 10/07/17 she had a bilateral diagnostic mammography with ultrasonography, showing: There is a highly suspicious mass in the left breast at 9 o'clock measuring 1.5 cm which corresponds with the palpable mass of concern. There is a smaller presumed satellite lesion 1 cm closer to the nipple at 9 o'clock. These 2 masses together span 3.1 cm. There are suspicious fine calcifications which extend anterior and lateral to the highly suspicious mass described above. The calcifications along with the mass together span 4.3 cm. There are multiple probably benign masses in the inferior and medial left breast, which may represent fibrocystic change. No evidence of left axillary lymphadenopathy. No evidence of right breast malignancy. She had a left needle core biopsy that showed: Breast, left, needle core biopsy, upper inner, mass with invasive ductal carcinoma. Ductal carcinoma in situ. Breast, left, needle core biopsy, upper inner, calcifications with ductal carcinoma in situ with calcification. Fibroadenoma. Prognostic indicators significant for: ER, 90% positive and PR, 100% positive, both with strong staining intensity. Proliferation marker Ki67 at 30%. HER2 negative.  On review of systems, she  reports night sweats, fatigue, pain (localized to breast and back), double vision, hearing loss, tinnitus, rhinorrhea, sore throat, hoarse voice, exertional SOB, heartburn, hernia, abdominal pain, pain/lump to left breast, abnormal moles, back pain, joint pain, weakness, thyroid issues, hot flashes, and blood clots. She denies any other symptoms.   GYN Hx not completed by patient.   PREVIOUS RADIATION THERAPY: No  PAST MEDICAL HISTORY:  has a past medical history of Basal cell carcinoma, Blood in stool, Colonic polyp, Diverticulitis, Diverticulosis, Esophageal reflux, Fibrocystic breast disease, GERD (gastroesophageal reflux disease) (05/29/2016), Hiatal hernia, Hyperlipidemia (05/29/2016), Hypothyroidism (05/29/2016), Migraine (05/29/2016), Migraine headache, OA (osteoarthritis), and Other and unspecified hyperlipidemia.    PAST SURGICAL HISTORY: Past Surgical History:  Procedure Laterality Date  . BLADDER SUSPENSION  1986  . CARDIAC CATHETERIZATION N/A 05/29/2016   Procedure: Left Heart Cath and Coronary Angiography;  Surgeon: Sherren Mocha, MD;  Location: [facility];  Service: Cardiovascular;  Laterality: N/A;  . CATARACT EXTRACTION, BILATERAL    . CHOLECYSTECTOMY  2002  . COLONOSCOPY  04/08  . ESOPHAGOGASTRODUODENOSCOPY  9/06, 4/08  . FOOT SURGERY  1990  . HEEL SPUR EXCISION  2001  . KNEE ARTHROSCOPY Bilateral 2006, 2013  . TOTAL ABDOMINAL HYSTERECTOMY W/ BILATERAL SALPINGOOPHORECTOMY  1981  . VEIN LIGATION Left 1980    FAMILY HISTORY: family history includes Breast cancer in her maternal aunt; Heart attack in her mother; Hypertension in her father; Kidney Stones in her sister and sister; Lung cancer in her maternal aunt.  SOCIAL HISTORY:  reports that she quit smoking about 37 years ago. Her smoking use included cigarettes. She has a 20.00 pack-year smoking history. she has never used smokeless  tobacco. She reports that she drinks alcohol. She reports that she does not use  drugs.  ALLERGIES: Codeine; Penicillins; Adhesive [tape]; and Protonix [pantoprazole sodium]  MEDICATIONS:  Current Outpatient Medications  Medication Sig Dispense Refill  . diclofenac sodium (VOLTAREN) 1 % GEL Apply topically as needed.    . gabapentin (NEURONTIN) 300 MG capsule Take 300 mg by mouth every morning.     Marland Kitchen HYDROcodone-acetaminophen (NORCO) 10-325 MG per tablet Take 1 tablet by mouth every 6 (six) hours as needed for moderate pain.     Marland Kitchen levothyroxine (SYNTHROID, LEVOTHROID) 125 MCG tablet Take 0.5 tablets (62.5 mcg total) by mouth daily before breakfast. 30 tablet 3  . Multiple Vitamins-Minerals (HAIR/SKIN/NAILS) CAPS Take by mouth daily.    Marland Kitchen omeprazole (PRILOSEC) 40 MG capsule Take 40 mg by mouth daily.    . ranitidine (ZANTAC) 300 MG capsule Take 300 mg by mouth every evening.     No current facility-administered medications for this encounter.     REVIEW OF SYSTEMS: A 10+ POINT REVIEW OF SYSTEMS WAS OBTAINED including neurology, dermatology, psychiatry, cardiac, respiratory, lymph, extremities, GI, GU, Musculoskeletal, constitutional, breasts, reproductive, HEENT.  All pertinent positives are noted in the HPI.  All others are negative.   PHYSICAL EXAM:   Vitals with BMI 10/22/2017  Height 5' 4"   Weight 164 lbs 2 oz  BMI 78.93  Systolic 810  Diastolic 85  Pulse 82  Respirations 18   General: Alert and oriented, in no acute distress Breasts Further physical exam deferred.   LABORATORY DATA:  Lab Results  Component Value Date   WBC 6.6 10/22/2017   HGB 12.6 05/28/2016   HCT 40.8 10/22/2017   MCV 91.7 10/22/2017   PLT 183 10/22/2017   CMP     Component Value Date/Time   NA 139 10/22/2017 1212   K 4.3 10/22/2017 1212   CL 103 10/22/2017 1212   CO2 26 10/22/2017 1212   GLUCOSE 127 10/22/2017 1212   BUN 14 10/22/2017 1212   CREATININE 0.73 05/29/2016 1006   CALCIUM 9.6 10/22/2017 1212   PROT 7.4 10/22/2017 1212   ALBUMIN 4.0 10/22/2017 1212   AST 20  10/22/2017 1212   ALT 23 10/22/2017 1212   ALKPHOS 80 10/22/2017 1212   BILITOT 1.0 10/22/2017 1212   GFRNONAA 56 (L) 10/22/2017 1212   GFRAA >60 10/22/2017 1212         RADIOGRAPHY: US Breast Ltd Uni Left Inc Axilla  Result Date: 10/07/2017 CLINICAL DATA:  84 year old female presenting for evaluation of a palpable mass in the left breast. EXAM: 2D DIGITAL DIAGNOSTIC BILATERAL MAMMOGRAM WITH CAD AND ADJUNCT TOMO LEFT BREAST ULTRASOUND COMPARISON:  Previous exam(s). ACR Breast Density Category c: The breast tissue is heterogeneously dense, which may obscure small masses. FINDINGS: A BB has been placed on the medial aspect of the left breast indicating the palpable site of concern. Deep to the palpable marker there is an irregular mass with indistinct margins measuring approximately 1.6 cm. Extending anterior and lateral to this are fine and amorphous calcifications. Incorporating the calcifications and the mass together, the region of abnormality spans approximately 4.3 cm. Centrally in the far posterior inferior left breast there are 2 adjacent masses which are slightly ill-defined, each measuring approximately 5 mm. In the medial right there are 2-3 punctate benign-appearing calcifications. No suspicious calcifications, masses or areas of distortion are seen in the right breast. Mammographic images were processed with CAD. There is a slightly protruding palpable mass in the medial  left breast with associated skin dimpling. Ultrasound targeted to the left breast at 9 o'clock, 7 cm from the nipple demonstrates an irregular hypoechoic vascular mass with indistinct margins measuring 1.5 x 0.2 x 0.3 cm. Slightly closer to the nipple at 9 o'clock, 6 cm from the nipple is a second irregular mass measuring 1.0 x 0.4 x 0.7 cm. The 2 masses are 1.2 cm apart, and together span 3.1 cm of tissue from lateral to margin to lateral margin. In addition to the above described masses, there are numerous small hypoechoic  oval and irregular masses. The 2 which appear to correspond with the masses in the inferior posterior left breast include 1 mass at 5:30, 3 cm from the nipple which measures 5 x 3 x 5 mm, and another mass at 6 o'clock, 3 cm from the nipple measuring 4 x 3 x 4 mm. Another representative mass is seen at 10 o'clock, 1 cm from the nipple measuring 4 x 3 x 4 mm. Ultrasound of the left axilla demonstrates multiple normal-appearing lymph nodes. IMPRESSION: 1. There is a highly suspicious mass in the left breast at 9 o'clock measuring 1.5 cm which corresponds with the palpable mass of concern. There is a smaller presumed satellite lesion 1 cm closer to the nipple at 9 o'clock. These 2 masses together span 3.1 cm. 2. There are suspicious fine calcifications which extend anterior and lateral to the highly suspicious mass described above. The calcifications along with the mass together span 4.3 cm. 3. There are multiple probably benign masses in the inferior and medial left breast, which may represent fibrocystic change. 4.  No evidence of left axillary lymphadenopathy. 5.  No evidence of right breast malignancy. RECOMMENDATION: 1. Ultrasound-guided biopsy is recommended for the dominant mass in the left breast at 9 o'clock. Stereotactic biopsy is recommended for the anterior margin of the calcifications in the left breast. These procedures have been scheduled for 10/13/2017 at 12:45 p.m. 2. Note that there are multiple other probably benign masses in the inferior and medial aspect of the left breast. If the above biopsies are malignant, and the patient desires breast conservation, additional ultrasound-guided biopsies versus possible MRI for extent of disease should be considered. I have discussed the findings and recommendations with the patient. Results were also provided in writing at the conclusion of the visit. If applicable, a reminder letter will be sent to the patient regarding the next appointment. BI-RADS CATEGORY   5: Highly suggestive of malignancy. Electronically Signed   By: Ammie Ferrier M.D.   On: 10/07/2017 16:10   Mm Diag Breast Tomo Bilateral  Result Date: 10/07/2017 CLINICAL DATA:  83 year old female presenting for evaluation of a palpable mass in the left breast. EXAM: 2D DIGITAL DIAGNOSTIC BILATERAL MAMMOGRAM WITH CAD AND ADJUNCT TOMO LEFT BREAST ULTRASOUND COMPARISON:  Previous exam(s). ACR Breast Density Category c: The breast tissue is heterogeneously dense, which may obscure small masses. FINDINGS: A BB has been placed on the medial aspect of the left breast indicating the palpable site of concern. Deep to the palpable marker there is an irregular mass with indistinct margins measuring approximately 1.6 cm. Extending anterior and lateral to this are fine and amorphous calcifications. Incorporating the calcifications and the mass together, the region of abnormality spans approximately 4.3 cm. Centrally in the far posterior inferior left breast there are 2 adjacent masses which are slightly ill-defined, each measuring approximately 5 mm. In the medial right there are 2-3 punctate benign-appearing calcifications. No suspicious calcifications, masses or  areas of distortion are seen in the right breast. Mammographic images were processed with CAD. There is a slightly protruding palpable mass in the medial left breast with associated skin dimpling. Ultrasound targeted to the left breast at 9 o'clock, 7 cm from the nipple demonstrates an irregular hypoechoic vascular mass with indistinct margins measuring 1.5 x 0.2 x 0.3 cm. Slightly closer to the nipple at 9 o'clock, 6 cm from the nipple is a second irregular mass measuring 1.0 x 0.4 x 0.7 cm. The 2 masses are 1.2 cm apart, and together span 3.1 cm of tissue from lateral to margin to lateral margin. In addition to the above described masses, there are numerous small hypoechoic oval and irregular masses. The 2 which appear to correspond with the masses in the  inferior posterior left breast include 1 mass at 5:30, 3 cm from the nipple which measures 5 x 3 x 5 mm, and another mass at 6 o'clock, 3 cm from the nipple measuring 4 x 3 x 4 mm. Another representative mass is seen at 10 o'clock, 1 cm from the nipple measuring 4 x 3 x 4 mm. Ultrasound of the left axilla demonstrates multiple normal-appearing lymph nodes. IMPRESSION: 1. There is a highly suspicious mass in the left breast at 9 o'clock measuring 1.5 cm which corresponds with the palpable mass of concern. There is a smaller presumed satellite lesion 1 cm closer to the nipple at 9 o'clock. These 2 masses together span 3.1 cm. 2. There are suspicious fine calcifications which extend anterior and lateral to the highly suspicious mass described above. The calcifications along with the mass together span 4.3 cm. 3. There are multiple probably benign masses in the inferior and medial left breast, which may represent fibrocystic change. 4.  No evidence of left axillary lymphadenopathy. 5.  No evidence of right breast malignancy. RECOMMENDATION: 1. Ultrasound-guided biopsy is recommended for the dominant mass in the left breast at 9 o'clock. Stereotactic biopsy is recommended for the anterior margin of the calcifications in the left breast. These procedures have been scheduled for 10/13/2017 at 12:45 p.m. 2. Note that there are multiple other probably benign masses in the inferior and medial aspect of the left breast. If the above biopsies are malignant, and the patient desires breast conservation, additional ultrasound-guided biopsies versus possible MRI for extent of disease should be considered. I have discussed the findings and recommendations with the patient. Results were also provided in writing at the conclusion of the visit. If applicable, a reminder letter will be sent to the patient regarding the next appointment. BI-RADS CATEGORY  5: Highly suggestive of malignancy. Electronically Signed   By: Ammie Ferrier  M.D.   On: 10/07/2017 16:10   Mm Clip Placement Left  Result Date: 10/13/2017 CLINICAL DATA:  Evaluate placement of biopsy clips following ultrasound and stereotactic guided biopsies of the left breast. EXAM: DIAGNOSTIC LEFT MAMMOGRAM POST ULTRASOUND and STEREOTACTIC BIOPSIES COMPARISON:  Previous exam(s). FINDINGS: Mammographic images were obtained following the ultrasound guided biopsy of the 1.5 cm mass at the 9:30 position of the left breast and the stereotactic guided biopsy of calcifications within the upper inner left breast. The ribbon shaped clip is in satisfactory position within the 1.5 cm upper inner left breast mass. The coil shaped clip is in satisfactory position corresponding to the anterior most calcifications within the upper inner left breast. The 2 clips are separated by a distance of 2.5 cm. IMPRESSION: Satisfactory placement of ribbon shaped clip in the 1.5 cm  upper inner left breast mass biopsied and satisfactory placement of coil shaped clip corresponding to the anterior most upper inner left breast calcifications biopsied. Final Assessment: Post Procedure Mammograms for Marker Placement Electronically Signed   By: Margarette Canada M.D.   On: 10/13/2017 14:14   Mm Lt Breast Bx W Loc Dev 1st Lesion Image Bx Spec Stereo Guide  Addendum Date: 10/14/2017   ADDENDUM REPORT: 10/14/2017 15:18 ADDENDUM: Pathology revealed GRADE II INVASIVE DUCTAL CARCINOMA, DUCTAL CARCINOMA IN SITU of the Left breast, upper inner, mass. INTERMEDIATE GRADE DUCTAL CARCINOMA IN SITU WITH CALCIFICATIONS, FIBROADENOMA of the Left breast, upper inner, calcifications. This was found to be concordant by Dr. Hassan Rowan. Pathology results were discussed with the patient by telephone. The patient reported doing well after the biopsies with tenderness at the sites. Post biopsy instructions and care were reviewed and questions were answered. The patient was encouraged to call [facility] for any  additional concerns. The patient was referred to [facility] on October 22, 2017. Note that there are multiple other probably benign masses in the inferior and medial aspect of the left breast. If the patient desires breast conservation, additional ultrasound-guided biopsies versus possible MRI for extent of disease should be considered. Pathology results reported by Terie Purser, RN on 10/14/2017. Electronically Signed   By: Margarette Canada M.D.   On: 10/14/2017 15:18   Result Date: 10/14/2017 CLINICAL DATA:  83 year old female for ultrasound-guided tissue sampling of suspicious mass within the slightly upper inner left breast and stereotactic guided tissue sampling of indeterminate calcifications within the slightly upper inner left breast. EXAM: ULTRASOUND GUIDED LEFT BREAST CORE NEEDLE BIOPSY STEREOTACTIC GUIDED LEFT BREAST CORE NEEDLE BIOPSY COMPARISON:  Previous exam(s). FINDINGS: I met with the patient and we discussed the procedure of ultrasound-guided and stereotactic guided biopsies, including benefits and alternatives. We discussed the high likelihood of successful procedures. We discussed the risks of the procedures, including infection, bleeding, tissue injury, clip migration, and inadequate sampling. Informed written consent was given. The usual time-out protocol was performed immediately prior to the procedures. ULTRASOUND-GUIDED LEFT BREAST BIOPSY: Using sterile technique and 1% Lidocaine as local anesthetic, under direct ultrasound visualization, a 14 gauge spring-loaded device was used to perform biopsy of the 1.5 cm hypoechoic mass at the 9:30 position of the left breast 6 cm from the nipple using a medial approach. Lesion quadrant: Upper inner left breast At the conclusion of the procedure, a ribbon shaped tissue marker clip was deployed into the biopsy cavity. Follow-up 2-view mammogram was performed and dictated  separately. STEREOTACTIC GUIDED LEFT BREAST BIOPSY: Using sterile technique and 1% Lidocaine as local anesthetic, under stereotactic guidance, a 9 gauge vacuum assisted device was used to perform core needle biopsy of the calcifications within the inner slightly upper left breast using a medial approach. Specimen radiograph was performed showing calcifications. Specimens with calcifications are identified for pathology. Lesion quadrant: Upper inner left breast At the conclusion of the procedure, a coil shaped tissue marker clip was deployed into the biopsy cavity. Follow-up 2-view mammogram was performed and dictated separately. IMPRESSION: Ultrasound guided biopsy of 1.5 cm mass within the upper inner left breast. Stereotactic guided biopsy of calcifications within the upper inner left breast. No apparent complications. Electronically Signed: By: Margarette Canada M.D. On: 10/13/2017 14:11   [place] Lt Breast Bx W Loc Dev 1st Lesion Img Bx Spec US Guide  Addendum Date: 10/14/2017  ADDENDUM REPORT: 10/14/2017 15:18 ADDENDUM: Pathology revealed GRADE II INVASIVE DUCTAL CARCINOMA, DUCTAL CARCINOMA IN SITU of the Left breast, upper inner, mass. INTERMEDIATE GRADE DUCTAL CARCINOMA IN SITU WITH CALCIFICATIONS, FIBROADENOMA of the Left breast, upper inner, calcifications. This was found to be concordant by Dr. Hassan Rowan. Pathology results were discussed with the patient by telephone. The patient reported doing well after the biopsies with tenderness at the sites. Post biopsy instructions and care were reviewed and questions were answered. The patient was encouraged to call [facility] for any additional concerns. The patient was referred to [facility] at [facility] on October 22, 2017. Note that there are multiple other probably benign masses in the inferior and medial aspect of the left breast. If the patient desires breast conservation,  additional ultrasound-guided biopsies versus possible MRI for extent of disease should be considered. Pathology results reported by Terie Purser, RN on 10/14/2017. Electronically Signed   By: Margarette Canada M.D.   On: 10/14/2017 15:18   Result Date: 10/14/2017 CLINICAL DATA:  83 year old female for ultrasound-guided tissue sampling of suspicious mass within the slightly upper inner left breast and stereotactic guided tissue sampling of indeterminate calcifications within the slightly upper inner left breast. EXAM: ULTRASOUND GUIDED LEFT BREAST CORE NEEDLE BIOPSY STEREOTACTIC GUIDED LEFT BREAST CORE NEEDLE BIOPSY COMPARISON:  Previous exam(s). FINDINGS: I met with the patient and we discussed the procedure of ultrasound-guided and stereotactic guided biopsies, including benefits and alternatives. We discussed the high likelihood of successful procedures. We discussed the risks of the procedures, including infection, bleeding, tissue injury, clip migration, and inadequate sampling. Informed written consent was given. The usual time-out protocol was performed immediately prior to the procedures. ULTRASOUND-GUIDED LEFT BREAST BIOPSY: Using sterile technique and 1% Lidocaine as local anesthetic, under direct ultrasound visualization, a 14 gauge spring-loaded device was used to perform biopsy of the 1.5 cm hypoechoic mass at the 9:30 position of the left breast 6 cm from the nipple using a medial approach. Lesion quadrant: Upper inner left breast At the conclusion of the procedure, a ribbon shaped tissue marker clip was deployed into the biopsy cavity. Follow-up 2-view mammogram was performed and dictated separately. STEREOTACTIC GUIDED LEFT BREAST BIOPSY: Using sterile technique and 1% Lidocaine as local anesthetic, under stereotactic guidance, a 9 gauge vacuum assisted device was used to perform core needle biopsy of the calcifications within the inner slightly upper left breast using a medial approach. Specimen  radiograph was performed showing calcifications. Specimens with calcifications are identified for pathology. Lesion quadrant: Upper inner left breast At the conclusion of the procedure, a coil shaped tissue marker clip was deployed into the biopsy cavity. Follow-up 2-view mammogram was performed and dictated separately. IMPRESSION: Ultrasound guided biopsy of 1.5 cm mass within the upper inner left breast. Stereotactic guided biopsy of calcifications within the upper inner left breast. No apparent complications. Electronically Signed: By: Margarette Canada M.D. On: 10/13/2017 14:11      IMPRESSION/PLAN: Clinical stage IA T1cN0 UIQ Left breast cancer   It was a pleasure meeting the patient today. She elected for mastectomy and I anticipate that she will not need radiation therapy. I will be available if her pathology shows a more advanced cancer than anticipated.  I wished her the best.     __________________________________________   Eppie Gibson, MD   This document serves as a record of services personally performed by Eppie Gibson, MD. It was created on her  behalf by Steva Colder, a trained medical scribe. The creation of this record is based on the scribe's personal observations and the provider's statements to them. This document has been checked and approved by the attending provider.

## 2017-10-22 NOTE — Assessment & Plan Note (Signed)
L 10/13/2017: Eft breast palpable mass and calcifications which are lateral and anterior to the mass, ultrasound 1.5 cm at 9:00, satellite mass, 3 additional masses 5 mm, 4 mm, 4 mm not biopsied.  Biopsy of calcifications: DCIS ER 100%, PR 100%; biopsy of the mass IDC grade 2 with DCIS ER 90%, PR 100%, Ki-67 30%, HER-2 negative ratio 1.83, T1 cN0 stage I a clinical stage  Pathology and radiology counseling:Discussed with the patient, the details of pathology including the type of breast cancer,the clinical staging, the significance of ER, PR and HER-2/neu receptors and the implications for treatment. After reviewing the pathology in detail, we proceeded to discuss the different treatment options between surgery, radiation, and, antiestrogen therapies.  Recommendations: 1.  Mastectomy followed by 2. Adjuvant antiestrogen therapy  Follow-up after surgery to discuss the final pathology report.

## 2017-10-23 ENCOUNTER — Other Ambulatory Visit: Payer: Self-pay

## 2017-10-23 DIAGNOSIS — Z170 Estrogen receptor positive status [ER+]: Principal | ICD-10-CM

## 2017-10-23 DIAGNOSIS — C50212 Malignant neoplasm of upper-inner quadrant of left female breast: Secondary | ICD-10-CM

## 2017-10-24 ENCOUNTER — Ambulatory Visit
Admission: RE | Admit: 2017-10-24 | Discharge: 2017-10-24 | Disposition: A | Discharge status: Home / Self Care | Payer: PPO | Source: Ambulatory Visit | Attending: Hematology and Oncology | Admitting: Hematology and Oncology

## 2017-10-24 ENCOUNTER — Other Ambulatory Visit: Payer: PPO

## 2017-10-24 DIAGNOSIS — M810 Age-related osteoporosis without current pathological fracture: Secondary | ICD-10-CM | POA: Diagnosis not present

## 2017-10-24 DIAGNOSIS — Z170 Estrogen receptor positive status [ER+]: Principal | ICD-10-CM

## 2017-10-24 DIAGNOSIS — Z780 Asymptomatic menopausal state: Secondary | ICD-10-CM | POA: Diagnosis not present

## 2017-10-24 DIAGNOSIS — C50212 Malignant neoplasm of upper-inner quadrant of left female breast: Secondary | ICD-10-CM

## 2017-10-27 ENCOUNTER — Encounter (HOSPITAL_BASED_OUTPATIENT_CLINIC_OR_DEPARTMENT_OTHER): Payer: Self-pay | Admitting: *Deleted

## 2017-10-27 ENCOUNTER — Other Ambulatory Visit: Payer: Self-pay

## 2017-10-28 ENCOUNTER — Encounter (HOSPITAL_BASED_OUTPATIENT_CLINIC_OR_DEPARTMENT_OTHER)
Admission: RE | Admit: 2017-10-28 | Discharge: 2017-10-28 | Disposition: A | Discharge status: Home / Self Care | Payer: PPO | Source: Ambulatory Visit | Attending: General Surgery | Admitting: General Surgery

## 2017-10-28 ENCOUNTER — Telehealth: Payer: Self-pay | Admitting: *Deleted

## 2017-10-28 DIAGNOSIS — E039 Hypothyroidism, unspecified: Secondary | ICD-10-CM | POA: Diagnosis not present

## 2017-10-28 DIAGNOSIS — Z87891 Personal history of nicotine dependence: Secondary | ICD-10-CM | POA: Diagnosis not present

## 2017-10-28 DIAGNOSIS — I10 Essential (primary) hypertension: Secondary | ICD-10-CM | POA: Diagnosis not present

## 2017-10-28 DIAGNOSIS — Z170 Estrogen receptor positive status [ER+]: Secondary | ICD-10-CM | POA: Diagnosis not present

## 2017-10-28 DIAGNOSIS — Z79899 Other long term (current) drug therapy: Secondary | ICD-10-CM | POA: Diagnosis not present

## 2017-10-28 DIAGNOSIS — C50312 Malignant neoplasm of lower-inner quadrant of left female breast: Secondary | ICD-10-CM | POA: Diagnosis not present

## 2017-10-28 NOTE — Progress Notes (Signed)
Ensure pre surgery drink given with instructions to complete by [facility], pt verbalized understanding.  EKG reviewed by Dr. Jenita Seashore, will proceed with surgery as scheduled.

## 2017-10-28 NOTE — Telephone Encounter (Signed)
Spoke to pt concerning [facility] from 1.30.19. Denies questions or concerns regarding dx or treatment care plan . Scheduled and confirmed post op with dr. Lindi Adie on 2.18. Encourage pt to call with needs. Received verbal understanding. Contact information provided.

## 2017-10-31 ENCOUNTER — Encounter (HOSPITAL_BASED_OUTPATIENT_CLINIC_OR_DEPARTMENT_OTHER)
Admission: RE | Disposition: A | Discharge status: Home / Self Care | Payer: Self-pay | Source: Ambulatory Visit | Attending: General Surgery

## 2017-10-31 ENCOUNTER — Ambulatory Visit (HOSPITAL_BASED_OUTPATIENT_CLINIC_OR_DEPARTMENT_OTHER)
Admission: RE | Admit: 2017-10-31 | Discharge: 2017-11-01 | Disposition: A | Discharge status: Home / Self Care | Payer: PPO | Source: Ambulatory Visit | Attending: General Surgery | Admitting: General Surgery

## 2017-10-31 ENCOUNTER — Ambulatory Visit (HOSPITAL_BASED_OUTPATIENT_CLINIC_OR_DEPARTMENT_OTHER): Payer: PPO | Admitting: Anesthesiology

## 2017-10-31 ENCOUNTER — Encounter (HOSPITAL_BASED_OUTPATIENT_CLINIC_OR_DEPARTMENT_OTHER): Payer: Self-pay | Admitting: Certified Registered"

## 2017-10-31 ENCOUNTER — Other Ambulatory Visit: Payer: Self-pay

## 2017-10-31 DIAGNOSIS — C50212 Malignant neoplasm of upper-inner quadrant of left female breast: Secondary | ICD-10-CM | POA: Diagnosis not present

## 2017-10-31 DIAGNOSIS — C50312 Malignant neoplasm of lower-inner quadrant of left female breast: Secondary | ICD-10-CM | POA: Diagnosis not present

## 2017-10-31 DIAGNOSIS — Z79899 Other long term (current) drug therapy: Secondary | ICD-10-CM | POA: Insufficient documentation

## 2017-10-31 DIAGNOSIS — Z170 Estrogen receptor positive status [ER+]: Secondary | ICD-10-CM | POA: Diagnosis not present

## 2017-10-31 DIAGNOSIS — E785 Hyperlipidemia, unspecified: Secondary | ICD-10-CM | POA: Diagnosis not present

## 2017-10-31 DIAGNOSIS — E039 Hypothyroidism, unspecified: Secondary | ICD-10-CM | POA: Insufficient documentation

## 2017-10-31 DIAGNOSIS — Z87891 Personal history of nicotine dependence: Secondary | ICD-10-CM | POA: Insufficient documentation

## 2017-10-31 DIAGNOSIS — G8918 Other acute postprocedural pain: Secondary | ICD-10-CM | POA: Diagnosis not present

## 2017-10-31 DIAGNOSIS — I10 Essential (primary) hypertension: Secondary | ICD-10-CM | POA: Diagnosis not present

## 2017-10-31 DIAGNOSIS — C50912 Malignant neoplasm of unspecified site of left female breast: Secondary | ICD-10-CM | POA: Diagnosis present

## 2017-10-31 DIAGNOSIS — K219 Gastro-esophageal reflux disease without esophagitis: Secondary | ICD-10-CM | POA: Diagnosis not present

## 2017-10-31 HISTORY — PX: TOTAL MASTECTOMY: SHX6129

## 2017-10-31 HISTORY — DX: Other chronic pain: G89.29

## 2017-10-31 HISTORY — DX: Dorsalgia, unspecified: M54.9

## 2017-10-31 HISTORY — DX: Malignant neoplasm of unspecified site of unspecified female breast: C50.919

## 2017-10-31 SURGERY — MASTECTOMY, SIMPLE
Anesthesia: General | Site: Breast | Laterality: Left

## 2017-10-31 MED ORDER — METOPROLOL TARTRATE 5 MG/5ML IV SOLN: 5.0000 mg | Freq: Four times a day (QID) | INTRAVENOUS | Status: DC | PRN

## 2017-10-31 MED ORDER — FENTANYL CITRATE (PF) 100 MCG/2ML IJ SOLN
INTRAMUSCULAR | Status: AC
  Filled 2017-10-31: qty 2

## 2017-10-31 MED ORDER — FENTANYL CITRATE (PF) 100 MCG/2ML IJ SOLN
25.0000 ug | INTRAMUSCULAR | Status: DC | PRN
  Administered 2017-10-31 (×3): 25 ug via INTRAVENOUS

## 2017-10-31 MED ORDER — FENTANYL CITRATE (PF) 100 MCG/2ML IJ SOLN
50.0000 ug | INTRAMUSCULAR | Status: AC | PRN
  Administered 2017-10-31 (×2): 50 ug via INTRAVENOUS
  Administered 2017-10-31: 25 ug via INTRAVENOUS

## 2017-10-31 MED ORDER — HEPARIN SODIUM (PORCINE) 5000 UNIT/ML IJ SOLN
INTRAMUSCULAR | Status: AC
  Filled 2017-10-31: qty 1

## 2017-10-31 MED ORDER — SCOPOLAMINE 1 MG/3DAYS TD PT72: 1.0000 | MEDICATED_PATCH | Freq: Once | TRANSDERMAL | Status: DC | PRN

## 2017-10-31 MED ORDER — GABAPENTIN 300 MG PO CAPS: 300.0000 mg | ORAL_CAPSULE | ORAL | Status: DC

## 2017-10-31 MED ORDER — TRAMADOL HCL 50 MG PO TABS: 50.0000 mg | ORAL_TABLET | Freq: Four times a day (QID) | ORAL | Status: DC | PRN

## 2017-10-31 MED ORDER — PROPOFOL 10 MG/ML IV BOLUS
INTRAVENOUS | Status: AC
  Filled 2017-10-31: qty 20

## 2017-10-31 MED ORDER — LACTATED RINGERS IV SOLN: INTRAVENOUS | Status: DC

## 2017-10-31 MED ORDER — CIPROFLOXACIN IN D5W 400 MG/200ML IV SOLN
INTRAVENOUS | Status: AC
  Filled 2017-10-31: qty 200

## 2017-10-31 MED ORDER — ROPIVACAINE HCL 5 MG/ML IJ SOLN
INTRAMUSCULAR | Status: DC | PRN
  Administered 2017-10-31: 5 mL via EPIDURAL
  Administered 2017-10-31 (×5): 5 mL via PERINEURAL

## 2017-10-31 MED ORDER — LACTATED RINGERS IV SOLN
INTRAVENOUS | Status: DC
  Administered 2017-10-31 (×2): via INTRAVENOUS

## 2017-10-31 MED ORDER — LEVOTHYROXINE SODIUM 100 MCG PO TABS: 100.0000 ug | ORAL_TABLET | Freq: Every day | ORAL | Status: DC

## 2017-10-31 MED ORDER — KETOROLAC TROMETHAMINE 30 MG/ML IJ SOLN
INTRAMUSCULAR | Status: AC
  Filled 2017-10-31: qty 1

## 2017-10-31 MED ORDER — HYDROCODONE-ACETAMINOPHEN 5-325 MG PO TABS
1.0000 | ORAL_TABLET | ORAL | Status: DC | PRN
  Administered 2017-10-31: 1 via ORAL
  Administered 2017-10-31: 2 via ORAL
  Administered 2017-10-31: 1 via ORAL
  Administered 2017-11-01 (×2): 2 via ORAL
  Filled 2017-10-31: qty 2
  Filled 2017-10-31: qty 1
  Filled 2017-10-31: qty 2
  Filled 2017-10-31: qty 1
  Filled 2017-10-31: qty 2

## 2017-10-31 MED ORDER — ONDANSETRON HCL 4 MG/2ML IJ SOLN
INTRAMUSCULAR | Status: DC | PRN
  Administered 2017-10-31: 4 mg via INTRAVENOUS

## 2017-10-31 MED ORDER — GABAPENTIN 300 MG PO CAPS
300.0000 mg | ORAL_CAPSULE | ORAL | Status: AC
  Administered 2017-10-31: 300 mg via ORAL

## 2017-10-31 MED ORDER — KETOROLAC TROMETHAMINE 30 MG/ML IJ SOLN
30.0000 mg | Freq: Once | INTRAMUSCULAR | Status: DC | PRN
Start: 2017-10-31 — End: 2017-10-31
  Administered 2017-10-31: 30 mg via INTRAVENOUS

## 2017-10-31 MED ORDER — ACETAMINOPHEN 500 MG PO TABS
1000.0000 mg | ORAL_TABLET | ORAL | Status: AC
  Administered 2017-10-31: 1000 mg via ORAL

## 2017-10-31 MED ORDER — MORPHINE SULFATE (PF) 4 MG/ML IV SOLN
INTRAVENOUS | Status: AC
  Filled 2017-10-31: qty 1

## 2017-10-31 MED ORDER — MEPERIDINE HCL 25 MG/ML IJ SOLN: 6.2500 mg | INTRAMUSCULAR | Status: DC | PRN

## 2017-10-31 MED ORDER — MIDAZOLAM HCL 2 MG/2ML IJ SOLN
INTRAMUSCULAR | Status: AC
  Filled 2017-10-31: qty 2

## 2017-10-31 MED ORDER — ACETAMINOPHEN 500 MG PO TABS
ORAL_TABLET | ORAL | Status: AC
  Filled 2017-10-31: qty 2

## 2017-10-31 MED ORDER — LIDOCAINE HCL (CARDIAC) 20 MG/ML IV SOLN
INTRAVENOUS | Status: DC | PRN
  Administered 2017-10-31: 100 mg via INTRAVENOUS
  Administered 2017-10-31: 30 mg via INTRAVENOUS

## 2017-10-31 MED ORDER — CHLORHEXIDINE GLUCONATE CLOTH 2 % EX PADS: 6.0000 | MEDICATED_PAD | Freq: Once | CUTANEOUS | Status: DC

## 2017-10-31 MED ORDER — HEPARIN SODIUM (PORCINE) 5000 UNIT/ML IJ SOLN
5000.0000 [IU] | Freq: Once | INTRAMUSCULAR | Status: AC
  Administered 2017-10-31: 5000 [IU] via SUBCUTANEOUS

## 2017-10-31 MED ORDER — PROMETHAZINE HCL 25 MG/ML IJ SOLN: 6.2500 mg | INTRAMUSCULAR | Status: DC | PRN

## 2017-10-31 MED ORDER — PROPOFOL 10 MG/ML IV BOLUS
INTRAVENOUS | Status: DC | PRN
  Administered 2017-10-31: 120 mg via INTRAVENOUS

## 2017-10-31 MED ORDER — MORPHINE SULFATE (PF) 2 MG/ML IV SOLN
2.0000 mg | INTRAVENOUS | Status: DC | PRN
  Administered 2017-10-31 – 2017-11-01 (×2): 2 mg via INTRAVENOUS

## 2017-10-31 MED ORDER — CELECOXIB 200 MG PO CAPS
ORAL_CAPSULE | ORAL | Status: AC
  Filled 2017-10-31: qty 1

## 2017-10-31 MED ORDER — ENOXAPARIN SODIUM 40 MG/0.4ML ~~LOC~~ SOLN: 40.0000 mg | SUBCUTANEOUS | Status: DC

## 2017-10-31 MED ORDER — DEXAMETHASONE SODIUM PHOSPHATE 4 MG/ML IJ SOLN
INTRAMUSCULAR | Status: DC | PRN
  Administered 2017-10-31: 10 mg via INTRAVENOUS

## 2017-10-31 MED ORDER — GABAPENTIN 300 MG PO CAPS
ORAL_CAPSULE | ORAL | Status: AC
  Filled 2017-10-31: qty 1

## 2017-10-31 MED ORDER — MIDAZOLAM HCL 2 MG/2ML IJ SOLN
1.0000 mg | INTRAMUSCULAR | Status: DC | PRN
  Administered 2017-10-31: 1 mg via INTRAVENOUS

## 2017-10-31 MED ORDER — CIPROFLOXACIN IN D5W 400 MG/200ML IV SOLN
400.0000 mg | INTRAVENOUS | Status: AC
  Administered 2017-10-31 (×2): 400 mg via INTRAVENOUS

## 2017-10-31 MED ORDER — EPHEDRINE SULFATE 50 MG/ML IJ SOLN
INTRAMUSCULAR | Status: DC | PRN
  Administered 2017-10-31 (×2): 10 mg via INTRAVENOUS

## 2017-10-31 MED ORDER — ONDANSETRON 4 MG PO TBDP: 4.0000 mg | ORAL_TABLET | Freq: Four times a day (QID) | ORAL | Status: DC | PRN

## 2017-10-31 MED ORDER — ONDANSETRON HCL 4 MG/2ML IJ SOLN: 4.0000 mg | Freq: Four times a day (QID) | INTRAMUSCULAR | Status: DC | PRN

## 2017-10-31 MED ORDER — CELECOXIB 200 MG PO CAPS
200.0000 mg | ORAL_CAPSULE | ORAL | Status: AC
  Administered 2017-10-31: 200 mg via ORAL

## 2017-10-31 SURGICAL SUPPLY — 58 items
APL SKNCLS STERI-STRIP NONHPOA (GAUZE/BANDAGES/DRESSINGS)
APPLIER CLIP 9.375 MED OPEN (MISCELLANEOUS)
APR CLP MED 9.3 20 MLT OPN (MISCELLANEOUS)
BENZOIN TINCTURE PRP APPL 2/3 (GAUZE/BANDAGES/DRESSINGS) IMPLANT
BLADE CLIPPER SURG (BLADE) IMPLANT
BLADE HEX COATED 2.75 (ELECTRODE) ×2 IMPLANT
BLADE SURG 10 STRL SS (BLADE) ×2 IMPLANT
BLADE SURG 15 STRL LF DISP TIS (BLADE) ×1 IMPLANT
BLADE SURG 15 STRL SS (BLADE) ×2
CANISTER SUCT 1200ML W/VALVE (MISCELLANEOUS) ×2 IMPLANT
CHLORAPREP W/TINT 26ML (MISCELLANEOUS) ×2 IMPLANT
CLIP APPLIE 9.375 MED OPEN (MISCELLANEOUS) IMPLANT
COVER BACK TABLE 60X90IN (DRAPES) ×2 IMPLANT
COVER MAYO STAND STRL (DRAPES) ×2 IMPLANT
DECANTER SPIKE VIAL GLASS SM (MISCELLANEOUS) IMPLANT
DEVICE DISSECT PLASMABLAD 3.0S (MISCELLANEOUS) ×1 IMPLANT
DRAIN CHANNEL 19F RND (DRAIN) ×2 IMPLANT
DRAPE LAPAROSCOPIC ABDOMINAL (DRAPES) ×2 IMPLANT
DRAPE SURG 17X23 STRL (DRAPES) ×4 IMPLANT
DRAPE UTILITY XL STRL (DRAPES) ×2 IMPLANT
ELECT REM PT RETURN 9FT ADLT (ELECTROSURGICAL) ×2
ELECTRODE REM PT RTRN 9FT ADLT (ELECTROSURGICAL) ×1 IMPLANT
EVACUATOR SILICONE 100CC (DRAIN) ×2 IMPLANT
GAUZE SPONGE 4X4 12PLY STRL (GAUZE/BANDAGES/DRESSINGS) ×2 IMPLANT
GAUZE VASELINE 3X9 (GAUZE/BANDAGES/DRESSINGS) ×2 IMPLANT
GLOVE BIO SURGEON STRL SZ7.5 (GLOVE) ×1 IMPLANT
GLOVE BIOGEL PI IND STRL 8 (GLOVE) ×1 IMPLANT
GLOVE BIOGEL PI INDICATOR 8 (GLOVE) ×1
GLOVE ECLIPSE 7.5 STRL STRAW (GLOVE) ×2 IMPLANT
GOWN STRL REUS W/ TWL LRG LVL3 (GOWN DISPOSABLE) ×1 IMPLANT
GOWN STRL REUS W/ TWL XL LVL3 (GOWN DISPOSABLE) ×1 IMPLANT
GOWN STRL REUS W/TWL LRG LVL3 (GOWN DISPOSABLE) ×2
GOWN STRL REUS W/TWL XL LVL3 (GOWN DISPOSABLE) ×2
LIGHT WAVEGUIDE WIDE FLAT (MISCELLANEOUS) IMPLANT
NEEDLE HYPO 25X1 1.5 SAFETY (NEEDLE) ×2 IMPLANT
NS IRRIG 1000ML POUR BTL (IV SOLUTION) ×2 IMPLANT
PACK BASIN DAY SURGERY FS (CUSTOM PROCEDURE TRAY) ×2 IMPLANT
PENCIL BUTTON HOLSTER BLD 10FT (ELECTRODE) ×2 IMPLANT
PIN SAFETY STERILE (MISCELLANEOUS) ×2 IMPLANT
PLASMABLADE 3.0S (MISCELLANEOUS) ×2
SLEEVE SCD COMPRESS KNEE MED (MISCELLANEOUS) ×2 IMPLANT
SPONGE LAP 18X18 X RAY DECT (DISPOSABLE) ×3 IMPLANT
SPONGE LAP 4X18 X RAY DECT (DISPOSABLE) IMPLANT
STAPLER VISISTAT 35W (STAPLE) ×2 IMPLANT
STRIP CLOSURE SKIN 1/2X4 (GAUZE/BANDAGES/DRESSINGS) IMPLANT
SUT ETHILON 3 0 PS 1 (SUTURE) ×2 IMPLANT
SUT MON AB 4-0 PC3 18 (SUTURE) ×4 IMPLANT
SUT SILK 2 0 SH (SUTURE) IMPLANT
SUT VIC AB 3-0 54X BRD REEL (SUTURE) IMPLANT
SUT VIC AB 3-0 BRD 54 (SUTURE) ×2
SUT VIC AB 3-0 SH 27 (SUTURE) ×2
SUT VIC AB 3-0 SH 27X BRD (SUTURE) ×1 IMPLANT
SUT VICRYL 3-0 CR8 SH (SUTURE) ×3 IMPLANT
SYR CONTROL 10ML LL (SYRINGE) ×2 IMPLANT
TOWEL OR 17X24 6PK STRL BLUE (TOWEL DISPOSABLE) ×2 IMPLANT
TOWEL OR NON WOVEN STRL DISP B (DISPOSABLE) ×2 IMPLANT
TUBE CONNECTING 20X1/4 (TUBING) ×3 IMPLANT
YANKAUER SUCT BULB TIP NO VENT (SUCTIONS) ×2 IMPLANT

## 2017-10-31 NOTE — Anesthesia Postprocedure Evaluation (Signed)
Anesthesia Post Note  Patient: Jamie Beltran  Procedure(s) Performed: LEFT TOTAL MASTECTOMY (Left Breast)     Patient location during evaluation: PACU Anesthesia Type: General Level of consciousness: awake Pain management: pain level controlled Vital Signs Assessment: post-procedure vital signs reviewed and stable Respiratory status: spontaneous breathing Cardiovascular status: stable Postop Assessment: no apparent nausea or vomiting Anesthetic complications: no    Last Vitals:  Vitals:   10/31/17 1230 10/31/17 1245  BP: 138/63 140/63  Pulse: 83 85  Resp: 14 12  Temp:    SpO2: 100% 100%    Last Pain:  Vitals:   10/31/17 1245  TempSrc:   PainSc: Asleep   Pain Goal: Patients Stated Pain Goal: 3 (10/31/17 0919)               [name],JOHN Mateo Flow

## 2017-10-31 NOTE — Anesthesia Preprocedure Evaluation (Signed)
Anesthesia Evaluation  Patient identified by MRN, date of birth, ID band Patient awake    Reviewed: Allergy & Precautions, NPO status , Patient's Chart, lab work & pertinent test results  Airway Mallampati: I       Dental no notable dental hx. (+) Teeth Intact   Pulmonary former smoker,    Pulmonary exam normal breath sounds clear to auscultation       Cardiovascular Normal cardiovascular exam Rhythm:Regular Rate:Normal     Neuro/Psych negative psych ROS   GI/Hepatic Neg liver ROS, hiatal hernia, GERD  Medicated and Controlled,  Endo/Other  Hypothyroidism   Renal/GU negative Renal ROS     Musculoskeletal   Abdominal Normal abdominal exam  (+)   Peds  Hematology   Anesthesia Other Findings   Reproductive/Obstetrics negative OB ROS                             Anesthesia Physical Anesthesia Plan  ASA: II  Anesthesia Plan: General   Post-op Pain Management:  Regional for Post-op pain   Induction: Intravenous  PONV Risk Score and Plan: 4 or greater and Ondansetron, Dexamethasone and Treatment may vary due to age or medical condition  Airway Management Planned: LMA  Additional Equipment:   Intra-op Plan:   Post-operative Plan:   Informed Consent: I have reviewed the patients History and Physical, chart, labs and discussed the procedure including the risks, benefits and alternatives for the proposed anesthesia with the patient or authorized representative who has indicated his/her understanding and acceptance.   Dental advisory given  Plan Discussed with: CRNA and Surgeon  Anesthesia Plan Comments:         Anesthesia Quick Evaluation

## 2017-10-31 NOTE — Interval H&P Note (Signed)
History and Physical Interval Note:  10/31/2017 10:22 AM  Jamie Beltran  has presented today for surgery, with the diagnosis of LEFT BREAST CANCER  The various methods of treatment have been discussed with the patient and family. After consideration of risks, benefits and other options for treatment, the patient has consented to  Procedure(s): LEFT TOTAL MASTECTOMY (Left) as a surgical intervention .  The patient's history has been reviewed, patient examined, no change in status, stable for surgery.  I have reviewed the patient's chart and labs.  Questions were answered to the patient's satisfaction.     Darene Lamer Ameliana Brashear

## 2017-10-31 NOTE — Progress Notes (Signed)
Assisted Dr. Hatchett with left, ultrasound guided, pectoralis block. Side rails up, monitors on throughout procedure. See vital signs in flow sheet. Tolerated Procedure well. 

## 2017-10-31 NOTE — Anesthesia Procedure Notes (Addendum)
Anesthesia Regional Block: Pectoralis block   Pre-Anesthetic Checklist: ,, timeout performed, Correct Patient, Correct Site, Correct Laterality, Correct Procedure, Correct Position, site marked, Risks and benefits discussed,  Surgical consent,  Pre-op evaluation,  At surgeon's request and post-op pain management  Laterality: Left and Upper  Prep: chloraprep       Needles:  Injection technique: Single-shot     Needle Length: 9cm  Needle Gauge: 21     Additional Needles:   Procedures:,,,, ultrasound used (permanent image in chart),,,,  Narrative:  Start time: 10/31/2017 9:38 AM End time: 10/31/2017 9:51 AM Injection made incrementally with aspirations every 5 mL.  Performed by: Personally  Anesthesiologist: Lyn Hollingshead, MD

## 2017-10-31 NOTE — H&P (Signed)
History of Present Illness Jamie Beltran T. Rony Ratz MD; 10/22/2017 1:55 PM) The patient is a 83 year old female who presents with breast cancer. She is a postmenopausal female referred by Dr. Melanee Spry for evaluation of recently diagnosed carcinoma of the left breast. She recently noted a mass in her left breast and skin dimpling . Subsequent imaging included diagnostic mamogram showing a mass and a separte area of Ca measuring a total of 4.5cm and ultrasound showing two nearby masses measuring 1.5 and 1 cm. Three additional masses, probably benign were also noted. An ultrasound guided breast biopsy was performed on 10/04/17 with pathology revealing ivasive carcinoma of the breast and the second mass DCIS. She is seen now in [facility] for initial treatment planning. She has experienced a lump and skin dimpling. She does have a personal history of fibrocystic breast problems.  Findings at that time were the following: Tumor size: 1.5 and 1 cm total 4.5 cm area Tumor grade: 2 and intermediate Estrogen Receptor: Both + Progesterone Receptor: Both + Her-2 neu: Neg Lymph node status: Neg    Past Surgical History Jamie Pummel, RN; 10/22/2017 7:34 AM) Breast Biopsy  Left. Cataract Surgery  Bilateral. Colon Polyp Removal - Colonoscopy  Foot Surgery  Bilateral, Right. Gallbladder Surgery - Laparoscopic  Hysterectomy (due to cancer) - Complete  Knee Surgery  Bilateral. Oral Surgery   Diagnostic Studies History Jamie Pummel, RN; 10/22/2017 7:34 AM) Colonoscopy  1-5 years ago Mammogram  within last year Pap Smear  >5 years ago 1-5 years ago  Medication History Jamie Pummel, RN; 10/22/2017 7:35 AM) Medications Reconciled  Social History Jamie Pummel, RN; 10/22/2017 7:34 AM) Alcohol use  Moderate alcohol use. Caffeine use  Coffee, Tea. No drug use  Tobacco use  Former smoker.  Family History Jamie Pummel, RN; 10/22/2017 7:34 AM) Arthritis  Sister. Breast Cancer  Family  Members In General. Diabetes Mellitus  Sister. Hypertension  Father, Son. Kidney Disease  Sister. Seizure disorder  Family Members In General. Thyroid problems  Sister.  Other Problems Jamie Pummel, RN; 10/22/2017 7:34 AM) Arthritis  Back Pain  Bladder Problems  Cholelithiasis  Gastroesophageal Reflux Disease  Hemorrhoids  Lump In Breast  Migraine Headache  Oophorectomy  Bilateral. Other disease, cancer, significant illness  Pulmonary Embolism / Blood Clot in Legs     Review of Systems Jamie Spillers Ledford RN; 10/22/2017 7:34 AM) General Present- Night Sweats. Not Present- Appetite Loss, Chills, Fatigue, Fever, Weight Gain and Weight Loss. Skin Present- Change in Wart/Mole and Dryness. Not Present- Hives, Jaundice, New Lesions, Non-Healing Wounds, Rash and Ulcer. HEENT Present- Hearing Loss, Ringing in the Ears, Sore Throat and Wears glasses/contact lenses. Not Present- Earache, Hoarseness, Nose Bleed, Oral Ulcers, Seasonal Allergies, Sinus Pain, Visual Disturbances and Yellow Eyes. Respiratory Present- Snoring. Not Present- Bloody sputum, Chronic Cough, Difficulty Breathing and Wheezing. Breast Present- Breast Mass, Breast Pain and Skin Changes. Not Present- Nipple Discharge. Cardiovascular Present- Difficulty Breathing Lying Down, Leg Cramps, Shortness of Breath and Swelling of Extremities. Not Present- Chest Pain, Palpitations and Rapid Heart Rate. Gastrointestinal Present- Abdominal Pain, Bloating, Chronic diarrhea, Excessive gas, Gets full quickly at meals, Hemorrhoids and Indigestion. Not Present- Bloody Stool, Change in Bowel Habits, Constipation, Difficulty Swallowing, Nausea, Rectal Pain and Vomiting. Female Genitourinary Not Present- Frequency, Nocturia, Painful Urination, Pelvic Pain and Urgency. Musculoskeletal Present- Back Pain, Joint Pain and Swelling of Extremities. Not Present- Joint Stiffness, Muscle Pain and Muscle Weakness. Neurological Present-  Tingling and Weakness. Not Present- Decreased Memory, Fainting, Headaches, Numbness, Seizures, Tremor and  Trouble walking. Psychiatric Present- Anxiety. Not Present- Bipolar, Change in Sleep Pattern, Depression, Fearful and Frequent crying. Endocrine Present- Cold Intolerance and Hot flashes. Not Present- Excessive Hunger, Hair Changes, Heat Intolerance and New Diabetes. Hematology Present- Easy Bruising. Not Present- Blood Thinners, Excessive bleeding, Gland problems, HIV and Persistent Infections.   Physical Exam Jamie Beltran T. Layza Summa MD; 10/22/2017 1:57 PM) The physical exam findings are as follows: Note:General: Alert, well-developed and well nourished elderly female, in no distress Skin: Warm and dry without rash or infection. HEENT: No palpable masses or thyromegaly. Sclera nonicteric. Pupils equal round and reactive. Lymph nodes: No cervical, supraclavicular, nodes palpable. Breasts: Two palpable masses about 1.5 cm each medial left breast. No other palp areas either breast Lungs: Breath sounds clear and equal. No wheezing or increased work of breathing. Cardiovascular: Regular rate and rhythm without murmer. Trace LE edema. Abdomen: Nondistended. Soft and nontender. No masses palpable. No organomegaly. Extremities: No edema or joint swelling or deformity. No chronic venous stasis changes. Neurologic: Alert and fully oriented. Gait normal. No focal weakness. Psychiatric: Normal mood and affect. Thought content appropriate with normal judgement and insight    Assessment & Plan Jamie Beltran T. Lylian Sanagustin MD; 10/22/2017 2:04 PM) MALIGNANT NEOPLASM OF LEFT BREAST, STAGE 1, ESTROGEN RECEPTOR POSITIVE (C50.912) Impression: 83 year old female with a new diagnosis of multicentric cancer of the left breast, lower inner quadrant. Clinical stage 1A, ER +, PR +, HER-2 - with the second area showing intermediate DCIS. I discussed with the patient and family members present today initial surgical  treatment options. We discussed options of breast conservation with lumpectomy or total mastectomy. After discussion they have elected to proceed with total mastectomy, a good choice I think due to multicentric disease and other worrisome areas in the breast. We discussed the indications and nature of the procedure, and expected recovery, in detail. Surgical risks including anesthetic complications, cardiorespiratory complications, bleeding, infection, wound healing complications, blood clots, lymphedema. Chemotherapy, hormonal therapy and radiation therapy have been discussed. They have been provided with literature regarding the treatment of breast cancer. All questions were answered. They understand and agree to proceed and we will go ahead with scheduling. Current Plans Left total mastectomy under general anesthesia with overnight hospitalization

## 2017-10-31 NOTE — Anesthesia Procedure Notes (Signed)
Procedure Name: LMA Insertion Date/Time: 10/31/2017 10:40 AM Performed by: Signe Colt, CRNA Pre-anesthesia Checklist: Patient identified, Emergency Drugs available, Suction available and Patient being monitored Patient Re-evaluated:Patient Re-evaluated prior to induction Oxygen Delivery Method: Circle system utilized Preoxygenation: Pre-oxygenation with 100% oxygen Induction Type: IV induction Ventilation: Mask ventilation without difficulty LMA: LMA inserted LMA Size: 4.0 Number of attempts: 1 Airway Equipment and Method: Bite block Placement Confirmation: positive ETCO2 Tube secured with: Tape Dental Injury: Teeth and Oropharynx as per pre-operative assessment

## 2017-10-31 NOTE — Op Note (Signed)
Preoperative Diagnosis: LEFT BREAST CANCER  Postoprative Diagnosis: LEFT BREAST CANCER  Procedure: Procedure(s): LEFT TOTAL MASTECTOMY   Surgeon: Excell Seltzer T   Assistants: None  Anesthesia:  General LMA anesthesia  Indications: 83 year old female with a new diagnosis of multicentric cancer of the left breast, lower inner quadrant. Clinical stage 1A, ER +, PR +, HER-2 - with the second area showing intermediate DCIS.  After workup and discussion detailed elsewhere we have elected to proceed with left total mastectomy as initial surgical therapy.   Procedure Detail: Patient was brought to the operating room, placed in the supine position on the operating table, and laryngeal mask general anesthesia induced.  She received preoperative IV antibiotics.  PAS were placed.  Left arm was carefully positioned extended and the entire left chest and axilla and upper arm were widely sterilely prepped and draped.  Patient timeout was performed and correct procedure verified.  I used a transversely oriented elliptical incision encompassing the nipple areolar complex.  Skin and subcutaneous flaps were raised superiorly toward the clavicle, medially to the sternum, inferiorly to the origin of the rectus and laterally out to the anterior border of the latissimus which was dissected and defined.  The breast was then reflected off the chest wall working medial to lateral dividing attachments to the pectoralis and serratus and freeing from the lateral border of the pectoralis.  The specimen was dissected anteriorly up off the latissimus laterally working up toward the axilla until the specimen was completely freed to the low axilla.  I then came across the low axillary contents with Kelly clamps and the specimen was removed and oriented and sent for permanent pathology.  Clamps were tied with 3-0 Vicryl.  The wound was thoroughly irrigated with saline and complete hemostasis obtained.  A 19 Blake closed suction  drain was left through a separate stab wound and placed under both flaps.  The subtenons tissue was closed with interrupted 3-0 Vicryl and the skin with subcuticular 5-0 Monocryl and Dermabond.  Sponge needle and instrument counts were correct.    Findings: As above  Estimated Blood Loss:  less than 50 mL         Drains: 19.  Round Blake drain  Blood Given: none          Specimens: Left total mastectomy        Complications:  * No complications entered in OR log *         Disposition: PACU - hemodynamically stable.         Condition: stable

## 2017-10-31 NOTE — Transfer of Care (Signed)
Immediate Anesthesia Transfer of Care Note  Patient: Jamie Beltran  Procedure(s) Performed: LEFT TOTAL MASTECTOMY (Left Breast)  Patient Location: PACU  Anesthesia Type:GA combined with regional for post-op pain  Level of Consciousness: awake and patient cooperative  Airway & Oxygen Therapy: Patient Spontanous Breathing and Patient connected to face mask oxygen  Post-op Assessment: Report given to RN and Post -op Vital signs reviewed and stable  Post vital signs: Reviewed and stable  Last Vitals:  Vitals:   10/31/17 0954 10/31/17 1000  BP:  134/69  Pulse: 73 71  Resp: 11 15  Temp:    SpO2: 100% 95%    Last Pain:  Vitals:   10/31/17 0919  TempSrc: Oral  PainSc: 3       Patients Stated Pain Goal: 3 (83/15/17 6160)  Complications: No apparent anesthesia complications

## 2017-11-01 DIAGNOSIS — C50312 Malignant neoplasm of lower-inner quadrant of left female breast: Secondary | ICD-10-CM | POA: Diagnosis not present

## 2017-11-01 NOTE — Discharge Instructions (Signed)
[facility] °[phone] ° °MASTECTOMY: POST OP INSTRUCTIONS ° °Always review your discharge instruction sheet given to you by the facility where your surgery was performed. °IF YOU HAVE DISABILITY OR FAMILY LEAVE FORMS, YOU MUST BRING THEM TO THE OFFICE FOR PROCESSING.   °DO NOT GIVE THEM TO YOUR DOCTOR. °A prescription for pain medication may be given to you upon discharge.  Take your pain medication as prescribed, if needed.  If narcotic pain medicine is not needed, then you may take acetaminophen (Tylenol) or ibuprofen (Advil) as needed. °1. Take your usually prescribed medications unless otherwise directed. °2. If you need a refill on your pain medication, please contact your pharmacy.  They will contact our office to request authorization.  Prescriptions will not be filled after 5pm or on week-ends. °3. You should follow a light diet the first few days after arrival home, such as soup and crackers, etc.  Resume your normal diet the day after surgery. °4. Most patients will experience some swelling and bruising on the chest and underarm.  Ice packs will help.  Swelling and bruising can take several days to resolve.  °5. It is common to experience some constipation if taking pain medication after surgery.  Increasing fluid intake and taking a stool softener (such as Colace) will usually help or prevent this problem from occurring.  A mild laxative (Milk of Magnesia or Miralax) should be taken according to package instructions if there are no bowel movements after 48 hours. °6. Unless discharge instructions indicate otherwise, leave your bandage dry and in place until your next appointment in 3-5 days.  You may take a limited sponge bath.  No tube baths or showers until the drains are removed.  You may have steri-strips (small skin tapes) in place directly over the incision.  These strips should be left on the skin for 7-10 days.  If your surgeon used skin glue on the incision, you may  shower in 24 hours.  The glue will flake off over the next 2-3 weeks.  Any sutures or staples will be removed at the office during your follow-up visit. °7. DRAINS:  If you have drains in place, it is important to keep a list of the amount of drainage produced each day in your drains.  Before leaving the hospital, you should be instructed on drain care.  Call our office if you have any questions about your drains. °8. ACTIVITIES:  You may resume regular (light) daily activities beginning the next day--such as daily self-care, walking, climbing stairs--gradually increasing activities as tolerated.  You may have sexual intercourse when it is comfortable.  Refrain from any heavy lifting or straining until approved by your doctor. °a. You may drive when you are no longer taking prescription pain medication, you can comfortably wear a seatbelt, and you can safely maneuver your car and apply brakes. °b. RETURN TO WORK:  __________________________________________________________ °9. You should see your doctor in the office for a follow-up appointment approximately 3-5 days after your surgery.  Your doctor’s nurse will typically make your follow-up appointment when she calls you with your pathology report.  Expect your pathology report 2-3 business days after your surgery.  You may call to check if you do not hear from us after three days.   °10. OTHER INSTRUCTIONS: ______________________________________________________________________________________________ ____________________________________________________________________________________________ ° °WHEN TO CALL YOUR DOCTOR: °1. Fever over 101.0 °2. Nausea and/or vomiting °3. Extreme swelling or bruising °4. Continued bleeding from incision. °5. Increased pain, redness, or drainage from the   incision. ° °The clinic staff is available to answer your questions during regular business hours.  Please don’t hesitate to call and ask to speak to one of the nurses for clinical  concerns.  If you have a medical emergency, go to the nearest emergency room or call 911.  A surgeon from [facility] is always on call at the hospital. °[address], [address], [place], [place]  [address] ? [address] [address], [place], [place]   [address] °[phone] ? [phone] ? FAX [phone] °Web site: [facility] ° °About my Jackson-Pratt Bulb Drain ° °What is a Jackson-Pratt bulb? °A Jackson-Pratt is a soft, round device used to collect drainage. It is connected to a long, thin drainage catheter, which is held in place by one or two small stiches near your surgical incision site. When the bulb is squeezed, it forms a vacuum, forcing the drainage to empty into the bulb. ° °Emptying the Jackson-Pratt bulb- °To empty the bulb: °1. Release the plug on the top of the bulb. °2. Pour the bulb's contents into a measuring container which your nurse will provide. °3. Record the time emptied and amount of drainage. Empty the drain(s) as often as your     doctor or nurse recommends. ° °Date                  Time                    Amount (Drain 1)                 Amount (Drain 2) ° °_____________________________________________________________________ ° °_____________________________________________________________________ ° °_____________________________________________________________________ ° °_____________________________________________________________________ ° °_____________________________________________________________________ ° °_____________________________________________________________________ ° °_____________________________________________________________________ ° °_____________________________________________________________________ ° °Squeezing the Jackson-Pratt Bulb- °To squeeze the bulb: °1. Make sure the plug at the top of the bulb is open. °2. Squeeze the bulb tightly in your fist. You will hear air squeezing from the bulb. °3. Replace the plug while the bulb is squeezed. °4.  Use a safety pin to attach the bulb to your clothing. This will keep the catheter from     pulling at the bulb insertion site. ° °When to call your doctor- °Call your doctor if: °· Drain site becomes red, swollen or hot. °· You have a fever greater than 101 degrees F. °· There is oozing at the drain site. °· Drain falls out (apply a guaze bandage over the drain hole and secure it with tape). °· Drainage increases daily not related to activity patterns. (You will usually have more drainage when you are active than when you are resting.) °· Drainage has a bad odor. ° °

## 2017-11-03 ENCOUNTER — Encounter (HOSPITAL_BASED_OUTPATIENT_CLINIC_OR_DEPARTMENT_OTHER): Payer: Self-pay | Admitting: General Surgery

## 2017-11-10 ENCOUNTER — Telehealth: Payer: Self-pay | Admitting: Adult Health

## 2017-11-10 ENCOUNTER — Inpatient Hospital Stay: Payer: PPO | Attending: Hematology and Oncology | Admitting: Hematology and Oncology

## 2017-11-10 DIAGNOSIS — Z170 Estrogen receptor positive status [ER+]: Secondary | ICD-10-CM | POA: Diagnosis not present

## 2017-11-10 DIAGNOSIS — M810 Age-related osteoporosis without current pathological fracture: Secondary | ICD-10-CM | POA: Diagnosis not present

## 2017-11-10 DIAGNOSIS — C50212 Malignant neoplasm of upper-inner quadrant of left female breast: Secondary | ICD-10-CM | POA: Diagnosis not present

## 2017-11-10 MED ORDER — ALENDRONATE SODIUM 70 MG PO TABS: 70.0000 mg | ORAL_TABLET | ORAL | 3 refills | Status: DC

## 2017-11-10 MED ORDER — LETROZOLE 2.5 MG PO TABS: 2.5000 mg | ORAL_TABLET | Freq: Every day | ORAL | 3 refills | Status: DC

## 2017-11-10 NOTE — Assessment & Plan Note (Signed)
Left Mastectomy: 10/31/2017: IDC grade 2 1.6 cm 0/3 lymph nodes negative, ER PR positive HER-2 negative with Ki-67 of 30%, T1 cN0 stage I a  Pathology counseling: I discussed the final pathology report of the patient provided  a copy of this report. I discussed the margins as well as lymph node surgeries. We also discussed the final staging along with previously performed ER/PR and HER-2/neu testing.  Recommendation: 1.  Letrozole 2.5 mg daily times 5 years 2. Osteoporosis: Bone density done recently showed a T score of -2.7 suggestive of osteoporosis.  I started the patient on Fosamax 70 mg once a week along with calcium and vitamin D.  I discussed with her about the precautions with Fosamax including drinking with a tall glass of water as well as to avoid lying down after taking the pill.  She is also instructed to inform as before any major dental procedures.  Return to clinic in 3 months for survivorship care plan visit

## 2017-11-10 NOTE — Telephone Encounter (Signed)
Gave avs and calendar for may  °

## 2017-11-10 NOTE — Progress Notes (Signed)
Patient Care Team: Shirline Frees, MD as PCP - General (Family Medicine)  DIAGNOSIS:  Encounter Diagnosis  Name Primary?  . Malignant neoplasm of upper-inner quadrant of left breast in female, estrogen receptor positive ([place])     SUMMARY OF ONCOLOGIC HISTORY:   Malignant neoplasm of upper-inner quadrant of left breast in female, estrogen receptor positive ([place])   10/13/2017 Initial Diagnosis    Left breast palpable mass and calcifications which are lateral and anterior to the mass, ultrasound 1.5 cm at 9:00, satellite mass, 3 additional masses 5 mm, 4 mm, 4 mm not biopsied.  Biopsy of calcifications: DCIS ER 100%, PR 100%; biopsy of the mass IDC grade 2 with DCIS ER 90%, PR 100%, Ki-67 30%, HER-2 negative ratio 1.83, T1 cN0 stage I a clinical stage       CHIEF COMPLIANT:   INTERVAL HISTORY: Jamie Beltran is a  REVIEW OF SYSTEMS:   Constitutional: Denies fevers, chills or abnormal weight loss Eyes: Denies blurriness of vision Ears, nose, mouth, throat, and face: Denies mucositis or sore throat Respiratory: Denies cough, dyspnea or wheezes Cardiovascular: Denies palpitation, chest discomfort Gastrointestinal:  Denies nausea, heartburn or change in bowel habits Skin: Denies abnormal skin rashes Lymphatics: Denies new lymphadenopathy or easy bruising Neurological:Denies numbness, tingling or new weaknesses Behavioral/Psych: Mood is stable, no new changes  Extremities: No lower extremity edema Breast: Left mastectomy All other systems were reviewed with the patient and are negative.  I have reviewed the past medical history, past surgical history, social history and family history with the patient and they are unchanged from previous note.  ALLERGIES:  is allergic to codeine; penicillins; adhesive [tape]; and protonix [pantoprazole sodium].  MEDICATIONS:  Current Outpatient Medications  Medication Sig Dispense Refill  . alendronate (FOSAMAX) 70 MG tablet Take 1 tablet (70  mg total) by mouth once a week. Take with a full glass of water on an empty stomach. 12 tablet 3  . aspirin EC 81 MG tablet Take 81 mg by mouth daily.    . diclofenac sodium (VOLTAREN) 1 % GEL Apply topically as needed.    . gabapentin (NEURONTIN) 300 MG capsule Take 300 mg by mouth every morning.     Marland Kitchen HYDROcodone-acetaminophen (NORCO) 10-325 MG per tablet Take 1 tablet by mouth every 6 (six) hours as needed for moderate pain.     Marland Kitchen letrozole (FEMARA) 2.5 MG tablet Take 1 tablet (2.5 mg total) by mouth daily. 90 tablet 3  . levothyroxine (SYNTHROID, LEVOTHROID) 100 MCG tablet Take 100 mcg by mouth daily before breakfast.    . Multiple Vitamins-Minerals (HAIR/SKIN/NAILS) CAPS Take by mouth daily.    Marland Kitchen omeprazole (PRILOSEC) 40 MG capsule Take 40 mg by mouth daily.    . ranitidine (ZANTAC) 300 MG capsule Take 300 mg by mouth every evening.     No current facility-administered medications for this visit.     PHYSICAL EXAMINATION: ECOG PERFORMANCE STATUS: 1  Vitals:   11/10/17 1420  BP: 140/76  Pulse: 77  Resp: 15  Temp: (!) 97.4 F (36.3 C)  SpO2: 98%   Filed Weights   11/10/17 1420  Weight: 161 lb 12.8 oz (73.4 kg)    GENERAL:alert, no distress and comfortable SKIN: skin color, texture, turgor are normal, no rashes or significant lesions EYES: normal, Conjunctiva are pink and non-injected, sclera clear OROPHARYNX:no exudate, no erythema and lips, buccal mucosa, and tongue normal  NECK: supple, thyroid normal size, non-tender, without nodularity LYMPH:  no palpable lymphadenopathy in the  cervical, axillary or inguinal LUNGS: clear to auscultation and percussion with normal breathing effort HEART: regular rate & rhythm and no murmurs and no lower extremity edema ABDOMEN:abdomen soft, non-tender and normal bowel sounds MUSCULOSKELETAL:no cyanosis of digits and no clubbing  NEURO: alert & oriented x 3 with fluent speech, no focal motor/sensory deficits EXTREMITIES: No lower  extremity edema  LABORATORY DATA:  I have reviewed the data as listed CMP Latest Ref Rng & Units 10/22/2017 05/29/2016 05/28/2016  Glucose 70 - 140 mg/dL 127 88 144(H)  BUN 7 - 26 mg/dL _0 Creatinine 0.60 - 1.10 mg/dL 0.94 0.73 0.82  Sodium 136 - 145 mmol/L 139 142 138  Potassium 3.5 - 5.1 mmol/L 4.3 4.1 4.0  Chloride 98 - 109 mmol/L 103 110 107  CO2 22 - 29 mmol/L _1 Calcium 8.4 - 10.4 mg/dL 9.6 9.6 9.2  Total Protein 6.4 - 8.3 g/dL 7.4 - -  Total Bilirubin 0.2 - 1.2 mg/dL 1.0 - -  Alkaline Phos 40 - 150 U/L 80 - -  AST 5 - 34 U/L 20 - -  ALT 0 - 55 U/L 23 - -    Lab Results  Component Value Date   WBC 6.6 10/22/2017   HGB 12.6 05/28/2016   HCT 40.8 10/22/2017   MCV 91.7 10/22/2017   PLT 183 10/22/2017   NEUTROABS 3.6 10/22/2017    ASSESSMENT & PLAN:  Malignant neoplasm of upper-inner quadrant of left breast in female, estrogen receptor positive ([place]) Left Mastectomy: 10/31/2017: IDC grade 2 1.6 cm 0/3 lymph nodes negative, ER PR positive HER-2 negative with Ki-67 of 30%, T1 cN0 stage I a  Pathology counseling: I discussed the final pathology report of the patient provided  a copy of this report. I discussed the margins as well as lymph node surgeries. We also discussed the final staging along with previously performed ER/PR and HER-2/neu testing.  Recommendation: 1.  Letrozole 2.5 mg daily times 5 years 2. Osteoporosis: Bone density done recently showed a T score of -2.7 suggestive of osteoporosis.  I started the patient on Fosamax 70 mg once a week along with calcium and vitamin D.  I discussed with her about the precautions with Fosamax including drinking with a tall glass of water as well as to avoid lying down after taking the pill.  She is also instructed to inform as before any major dental procedures.  Return to clinic in 3 months for survivorship care plan visit   I spent 25 minutes talking to the patient of which more than half was spent in counseling and  coordination of care.  No orders of the defined types were placed in this encounter.  The patient has a good understanding of the overall plan. she agrees with it. she will call with any problems that may develop before the next visit here.   Harriette Ohara, MD 11/10/17

## 2017-11-11 ENCOUNTER — Other Ambulatory Visit: Payer: PPO

## 2017-11-14 ENCOUNTER — Other Ambulatory Visit: Payer: PPO

## 2017-11-26 ENCOUNTER — Other Ambulatory Visit: Payer: Self-pay | Admitting: Hematology and Oncology

## 2017-11-26 DIAGNOSIS — M810 Age-related osteoporosis without current pathological fracture: Secondary | ICD-10-CM

## 2017-12-30 DIAGNOSIS — C50912 Malignant neoplasm of unspecified site of left female breast: Secondary | ICD-10-CM | POA: Diagnosis not present

## 2017-12-31 ENCOUNTER — Telehealth: Payer: Self-pay

## 2017-12-31 NOTE — Telephone Encounter (Signed)
Received a call from patient reporting that she is experiencing a lot of joint pain/stiffness with letrozole. Pt had been on it for 2 months, and have had hot flashes as well, which she takes gabapentin 300mg  daily. Pt is now unable to tolerate the joint pain during the day and it makes it difficult to ambulate.   Advised pt to take her medication at night and per Dr.Gudena, increase gabapentin to BID. Pt also advised to take OTC tylenol as needed for breakthrough pain as needed. Pt to call in the next 2 weeks to report how her symptoms are doing after the medication changes. Pt verbalized understanding and has no further questions at this time.

## 2018-01-02 DIAGNOSIS — C50912 Malignant neoplasm of unspecified site of left female breast: Secondary | ICD-10-CM | POA: Diagnosis not present

## 2018-01-12 DIAGNOSIS — H43813 Vitreous degeneration, bilateral: Secondary | ICD-10-CM | POA: Diagnosis not present

## 2018-01-12 DIAGNOSIS — H0015 Chalazion left lower eyelid: Secondary | ICD-10-CM | POA: Diagnosis not present

## 2018-01-12 DIAGNOSIS — H532 Diplopia: Secondary | ICD-10-CM | POA: Diagnosis not present

## 2018-01-12 DIAGNOSIS — Z961 Presence of intraocular lens: Secondary | ICD-10-CM | POA: Diagnosis not present

## 2018-01-12 DIAGNOSIS — H0014 Chalazion left upper eyelid: Secondary | ICD-10-CM | POA: Diagnosis not present

## 2018-01-12 DIAGNOSIS — H1859 Other hereditary corneal dystrophies: Secondary | ICD-10-CM | POA: Diagnosis not present

## 2018-01-12 DIAGNOSIS — H16223 Keratoconjunctivitis sicca, not specified as Sjogren's, bilateral: Secondary | ICD-10-CM | POA: Diagnosis not present

## 2018-02-04 ENCOUNTER — Telehealth: Payer: Self-pay

## 2018-02-04 NOTE — Telephone Encounter (Signed)
Spoke with patient to remind of SCP visit with NP on 02/09/18 at 2 pm.  Patient said she will be at appt and thanked nurse for reminder call.

## 2018-02-09 ENCOUNTER — Inpatient Hospital Stay: Payer: PPO | Attending: Hematology and Oncology | Admitting: Adult Health

## 2018-02-09 ENCOUNTER — Telehealth: Payer: Self-pay | Admitting: Hematology and Oncology

## 2018-02-09 ENCOUNTER — Other Ambulatory Visit: Payer: Self-pay | Admitting: Adult Health

## 2018-02-09 ENCOUNTER — Encounter: Payer: Self-pay | Admitting: Adult Health

## 2018-02-09 VITALS — BP 139/50 | HR 76 | Temp 97.9°F | Resp 18 | Ht 64.0 in | Wt 162.3 lb

## 2018-02-09 DIAGNOSIS — Z170 Estrogen receptor positive status [ER+]: Secondary | ICD-10-CM | POA: Diagnosis not present

## 2018-02-09 DIAGNOSIS — Z79811 Long term (current) use of aromatase inhibitors: Secondary | ICD-10-CM | POA: Diagnosis not present

## 2018-02-09 DIAGNOSIS — C50212 Malignant neoplasm of upper-inner quadrant of left female breast: Secondary | ICD-10-CM

## 2018-02-09 DIAGNOSIS — Z1239 Encounter for other screening for malignant neoplasm of breast: Secondary | ICD-10-CM

## 2018-02-09 DIAGNOSIS — Z1231 Encounter for screening mammogram for malignant neoplasm of breast: Secondary | ICD-10-CM

## 2018-02-09 NOTE — Progress Notes (Signed)
CLINIC:  Survivorship   REASON FOR VISIT:  Routine follow-up post-treatment for a recent history of breast cancer.  BRIEF ONCOLOGIC HISTORY:    Malignant neoplasm of upper-inner quadrant of left breast in female, estrogen receptor positive ([place])   10/13/2017 Initial Diagnosis    Left breast palpable mass and calcifications which are lateral and anterior to the mass, ultrasound 1.5 cm at 9:00, satellite mass, 3 additional masses 5 mm, 4 mm, 4 mm not biopsied.  Biopsy of calcifications: DCIS ER 100%, PR 100%; biopsy of the mass IDC grade 2 with DCIS ER 90%, PR 100%, Ki-67 30%, HER-2 negative ratio 1.83, T1 cN0 stage I a clinical stage      10/31/2017 Surgery    Left breast mastectomy ([facility]): IDC, grade 2, 1.6 cm, margins negative, no SLNB      10/2017 -  Anti-estrogen oral therapy    Letrozole daily      02/04/2018 Cancer Staging    Staging form: Breast, AJCC 8th Edition - Pathologic: Stage IA (pT1c, pN0, cM0, G2, ER+, PR+, HER2-) - Signed by Gardenia Phlegm, NP on 02/04/2018       INTERVAL HISTORY:  Ms. Sura presents to the [facility] today for our initial meeting to review her survivorship care plan detailing her treatment course for breast cancer, as well as monitoring long-term side effects of that treatment, education regarding health maintenance, screening, and overall wellness and health promotion.     Overall, Ms. Nightengale reports feeling quite well.  She is taking the Letrozole daily and is tolerating it well.  She is experiencing hot flashes.  She was having joint aches, however started taking the Letrozole daily and is now not experiencing any joint aches.      REVIEW OF SYSTEMS:  Review of Systems  Constitutional: Negative for appetite change, chills, fatigue, fever and unexpected weight change.  HENT:   Negative for hearing loss, lump/mass, nosebleeds, sore throat and trouble swallowing.   Eyes: Negative for eye problems and icterus.  Respiratory:  Negative for chest tightness, cough and shortness of breath.   Cardiovascular: Negative for chest pain, leg swelling and palpitations.  Gastrointestinal: Negative for abdominal distention, abdominal pain, constipation, diarrhea, nausea and vomiting.  Endocrine: Positive for hot flashes.  Musculoskeletal: Positive for back pain (chronic).  Skin: Negative for itching and rash.  Neurological: Negative for dizziness, extremity weakness and numbness.  Hematological: Negative for adenopathy. Does not bruise/bleed easily.  Psychiatric/Behavioral: Negative for depression. The patient is not nervous/anxious.    Breast: Denies any new nodularity, masses, tenderness, nipple changes, or nipple discharge.      ONCOLOGY TREATMENT TEAM:  1. Surgeon:  Dr. Excell Seltzer at [facility] 2. Medical Oncologist: Dr. Lindi Adie   PAST MEDICAL/SURGICAL HISTORY:  Past Medical History:  Diagnosis Date  . Basal cell carcinoma   . Blood in stool   . Breast cancer ([place]) 09/2017   left breast  . Chronic back pain   . Colonic polyp   . Diverticulitis   . Diverticulosis   . Esophageal reflux   . Fibrocystic breast disease   . GERD (gastroesophageal reflux disease) 05/29/2016  . Hiatal hernia   . Hyperlipidemia 05/29/2016  . Hypothyroidism 05/29/2016  . Migraine 05/29/2016  . Migraine headache   . OA (osteoarthritis)    back, hips, knees  . Other and unspecified hyperlipidemia    Past Surgical History:  Procedure Laterality Date  . ABDOMINAL HYSTERECTOMY    . BLADDER SUSPENSION  1986  .  CARDIAC CATHETERIZATION N/A 05/29/2016   Procedure: Left Heart Cath and Coronary Angiography;  Surgeon: Sherren Mocha, MD;  Location: [facility];  Service: Cardiovascular;  Laterality: N/A;  . CATARACT EXTRACTION, BILATERAL    . CHOLECYSTECTOMY  2002  . COLONOSCOPY  04/08  . ESOPHAGOGASTRODUODENOSCOPY  9/06, 4/08  . FOOT SURGERY  1990  . HEEL SPUR EXCISION  2001  . KNEE ARTHROSCOPY Bilateral 2006, 2013  .  TOTAL ABDOMINAL HYSTERECTOMY W/ BILATERAL SALPINGOOPHORECTOMY  1981  . TOTAL MASTECTOMY Left 10/31/2017   Procedure: LEFT TOTAL MASTECTOMY;  Surgeon: Excell Seltzer, MD;  Location: [facility];  Service: General;  Laterality: Left;  Marland Kitchen VEIN LIGATION Left 1980     ALLERGIES:  Allergies  Allergen Reactions  . Codeine Other (See Comments)    "Makes me crazy."  . Penicillins Hives and Swelling    Has patient had a PCN reaction causing immediate rash, facial/tongue/throat swelling, SOB or lightheadedness with hypotension: Yes Has patient had a PCN reaction causing severe rash involving mucus membranes or skin necrosis: No Has patient had a PCN reaction that required hospitalization No Has patient had a PCN reaction occurring within the last 10 years: No If all of the above answers are "NO", then may proceed with Cephalosporin use.   . Adhesive [Tape] Itching    Heart monitor pads  . Protonix [Pantoprazole Sodium] Diarrhea    With 40 mg twice daily, can tolerate 40 mg once daily       CURRENT MEDICATIONS:  Outpatient Encounter Medications as of 02/09/2018  Medication Sig  . alendronate (FOSAMAX) 70 MG tablet Take 1 tablet (70 mg total) by mouth once a week. Take with a full glass of water on an empty stomach.  Marland Kitchen aspirin EC 81 MG tablet Take 81 mg by mouth daily.  . diclofenac sodium (VOLTAREN) 1 % GEL Apply topically as needed.  . gabapentin (NEURONTIN) 300 MG capsule Take 300 mg by mouth 2 (two) times daily.   Marland Kitchen HYDROcodone-acetaminophen (NORCO) 10-325 MG per tablet Take 1 tablet by mouth every 6 (six) hours as needed for moderate pain.   Marland Kitchen letrozole (FEMARA) 2.5 MG tablet Take 1 tablet (2.5 mg total) by mouth daily. (Patient taking differently: Take 2.5 mg by mouth at bedtime. )  . levothyroxine (SYNTHROID, LEVOTHROID) 100 MCG tablet Take 100 mcg by mouth daily before breakfast.  . Multiple Vitamins-Minerals (HAIR/SKIN/NAILS) CAPS Take by mouth daily.  Marland Kitchen omeprazole  (PRILOSEC) 40 MG capsule Take 40 mg by mouth daily.  . ranitidine (ZANTAC) 300 MG capsule Take 300 mg by mouth every evening.   No facility-administered encounter medications on file as of 02/09/2018.      ONCOLOGIC FAMILY HISTORY:  Family History  Problem Relation Age of Onset  . Hypertension Father   . Heart attack Mother   . Kidney Stones Sister        kidney problems  . Kidney Stones Sister        kidney transplant  . Breast cancer Maternal Aunt   . Lung cancer Maternal Aunt        SOCIAL HISTORY:  Social History   Socioeconomic History  . Marital status: Married    Spouse name: Timmothy Sours  . Number of children: 3  . Years of education: HS  . Highest education level: Not on file  Occupational History  . Occupation: retired  . Occupation: RETIRED    Employer: [employer]  Social Needs  . Financial resource strain: Not on file  .  Food insecurity:    Worry: Not on file    Inability: Not on file  . Transportation needs:    Medical: Not on file    Non-medical: Not on file  Tobacco Use  . Smoking status: Former Smoker    Packs/day: 1.00    Years: 20.00    Pack years: 20.00    Types: Cigarettes    Last attempt to quit: 09/23/1980    Years since quitting: 37.4  . Smokeless tobacco: Never Used  Substance and Sexual Activity  . Alcohol use: Yes    Comment: rarely  . Drug use: No  . Sexual activity: Not on file  Lifestyle  . Physical activity:    Days per week: Not on file    Minutes per session: Not on file  . Stress: Not on file  Relationships  . Social connections:    Talks on phone: Not on file    Gets together: Not on file    Attends religious service: Not on file    Active member of club or organization: Not on file    Attends meetings of clubs or organizations: Not on file    Relationship status: Not on file  . Intimate partner violence:    Fear of current or ex partner: Not on file    Emotionally abused: Not on file    Physically abused: Not on file     Forced sexual activity: Not on file  Other Topics Concern  . Not on file  Social History Narrative   Patient lives at home with spouse.   Caffeine use: 1-2 cups daily      PHYSICAL EXAMINATION:  Vital Signs:   Vitals:   02/09/18 1446  BP: (!) 139/50  Pulse: 76  Resp: 18  Temp: 97.9 F (36.6 C)  SpO2: 100%   Filed Weights   02/09/18 1446  Weight: 162 lb 4.8 oz (73.6 kg)   General: Well-nourished, well-appearing female in no acute distress.  She is unaccompanied today.   HEENT: Head is normocephalic.  Pupils equal and reactive to light. Conjunctivae clear without exudate.  Sclerae anicteric. Oral mucosa is pink, moist.  Oropharynx is pink without lesions or erythema.  Lymph: No cervical, supraclavicular, or infraclavicular lymphadenopathy noted on palpation.  Cardiovascular: Regular rate and rhythm.Marland Kitchen Respiratory: Clear to auscultation bilaterally. Chest expansion symmetric; breathing non-labored.  Breasts: left breast s/p mastectomy, no nodules, masses, or any other changes noted, right breast without nodules, masses, skin or nipple changes.   GI: Abdomen soft and round; non-tender, non-distended. Bowel sounds normoactive.  GU: Deferred.  Neuro: No focal deficits. Steady gait.  Psych: Mood and affect normal and appropriate for situation.  Extremities: No edema. MSK: No focal spinal tenderness to palpation.  Full range of motion in bilateral upper extremities Skin: Warm and dry.  LABORATORY DATA:  None for this visit.  DIAGNOSTIC IMAGING:  None for this visit.      ASSESSMENT AND PLAN:  Ms.. Hypolite is a pleasant 83 y.o. female with Stage IA left breast invasive ductal carcinoma, ER+/PR+/HER2-, diagnosed in 09/2017, treated with mastectomy and anti estrogen therapy with Letrozole beginning in 10/2017.  She presents to the Survivorship Clinic for our initial meeting and routine follow-up post-completion of treatment for breast cancer.    1. Stage IA left breast cancer:   Ms. Fernandez is continuing to recover from definitive treatment for breast cancer. She will follow-up with her medical oncologist, Dr. Lindi Adie in 6 months with history and physical exam  per surveillance protocol.  She will continue her anti-estrogen therapy with Letrozole. Thus far, she is tolerating the Letrozole well, with minimal hot flashes.  I did give her resources of things to try for them. She was instructed to make Dr. Lindi Adie or myself aware if she begins to experience any worsening side effects of the medication and I could see her back in clinic to help manage those side effects, as needed.  Today, a comprehensive survivorship care plan and treatment summary was reviewed with the patient today detailing her breast cancer diagnosis, treatment course, potential late/long-term effects of treatment, appropriate follow-up care with recommendations for the future, and patient education resources.  A copy of this summary, along with a letter will be sent to the patient's primary care provider via mail/fax/In Basket message after today's visit.    2. Bone health:  Given Ms. Huseby's age/history of breast cancer and her current treatment regimen including anti-estrogen therapy with Letrozole, she is at risk for bone demineralization.  Her last DEXA scan was 10/24/2017 and demonstrated a T score of -2.7 in the left femur.  She was palced on Fosamax and is tolerating it well.  She will be due for her next bone density in 10/2019.  In the meantime, she was encouraged to increase her consumption of foods rich in calcium, as well as increase her weight-bearing activities.  She was given education on specific activities to promote bone health.  3. Cancer screening:  Due to Ms. Chamberland's history and her age, she should receive screening for skin cancers, colon cancer, and gynecologic cancers.  The information and recommendations are listed on the patient's comprehensive care plan/treatment summary and were reviewed in detail  with the patient.    4. Health maintenance and wellness promotion: Ms. Graffeo was encouraged to consume 5-7 servings of fruits and vegetables per day. We reviewed the "Nutrition Rainbow" handout, as well as the handout "Take Control of Your Health and Reduce Your Cancer Risk" from the [facility].  She was also encouraged to engage in moderate to vigorous exercise for 30 minutes per day most days of the week. We discussed the [employer] fitness program, which is designed for cancer survivors to help them become more physically fit after cancer treatments.  She was instructed to limit her alcohol consumption and continue to abstain from tobacco use.     5. Support services/counseling: It is not uncommon for this period of the patient's cancer care trajectory to be one of many emotions and stressors.  We discussed an opportunity for her to participate in the next session of [facility] ("Finding Your New Normal") support group series designed for patients after they have completed treatment.   Ms. Shawn was encouraged to take advantage of our many other support services programs, support groups, and/or counseling in coping with her new life as a cancer survivor after completing anti-cancer treatment. She was given information regarding our available services and encouraged to contact me with any questions or for help enrolling in any of our support group/programs.    Dispo:   -Return to cancer center for f/u with Dr. Lindi Adie in 6 months -Mammogram due in 12-09/2017 -Bone Density 10/2019 -Follow up with Dr. Excell Seltzer  -She is welcome to return back to the Survivorship Clinic at any time; no additional follow-up needed at this time.  -Consider referral back to survivorship as a long-term survivor for continued surveillance  A total of (30) minutes of face-to-face time was spent with this  patient with greater than 50% of that time in counseling and care-coordination.   Gardenia Phlegm,  [place] [phone]   Note: PRIMARY CARE PROVIDER Shirline Frees, [place] [phone]

## 2018-02-09 NOTE — Telephone Encounter (Signed)
Gave avs and calendar ° °

## 2018-03-20 DIAGNOSIS — R1310 Dysphagia, unspecified: Secondary | ICD-10-CM | POA: Diagnosis not present

## 2018-03-27 DIAGNOSIS — R1310 Dysphagia, unspecified: Secondary | ICD-10-CM | POA: Diagnosis not present

## 2018-03-27 DIAGNOSIS — K317 Polyp of stomach and duodenum: Secondary | ICD-10-CM | POA: Diagnosis not present

## 2018-03-27 DIAGNOSIS — K2930 Chronic superficial gastritis without bleeding: Secondary | ICD-10-CM | POA: Diagnosis not present

## 2018-03-27 DIAGNOSIS — Q399 Congenital malformation of esophagus, unspecified: Secondary | ICD-10-CM | POA: Diagnosis not present

## 2018-04-01 DIAGNOSIS — K2930 Chronic superficial gastritis without bleeding: Secondary | ICD-10-CM | POA: Diagnosis not present

## 2018-04-01 DIAGNOSIS — K317 Polyp of stomach and duodenum: Secondary | ICD-10-CM | POA: Diagnosis not present

## 2018-04-21 ENCOUNTER — Telehealth: Payer: Self-pay

## 2018-04-21 NOTE — Telephone Encounter (Signed)
Spoke with patient about recent hair loss d/t possible medication side effect.  Patient wishes to come in to see Dr. Lindi Adie to discuss options.  Schedule message sent to reach out to patient to schedule appointment for next week.

## 2018-04-22 ENCOUNTER — Telehealth: Payer: Self-pay | Admitting: Hematology and Oncology

## 2018-04-22 NOTE — Telephone Encounter (Signed)
Spoke to patient regarding upcoming aug appts per 7/30 sch message

## 2018-04-26 NOTE — Assessment & Plan Note (Signed)
Left Mastectomy: 10/31/2017: IDC grade 2 1.6 cm 0/3 lymph nodes negative, ER PR positive HER-2 negative with Ki-67 of 30%, T1 cN0 stage I a  Recommendation: 1.  Letrozole 2.5 mg daily times 5 years 2. Osteoporosis: Bone density done recently showed a T score of -2.7 suggestive of osteoporosis.  I started the patient on Fosamax 70 mg once a week along with calcium and vitamin D.    Letrozole Toxicities: None  Breast Cancer Surveillance:  1. Breast Exam: 04/27/18 2. Mammogram: sch for Jan 2020

## 2018-04-27 ENCOUNTER — Inpatient Hospital Stay: Payer: PPO | Attending: Hematology and Oncology | Admitting: Hematology and Oncology

## 2018-04-27 ENCOUNTER — Telehealth: Payer: Self-pay | Admitting: Hematology and Oncology

## 2018-04-27 DIAGNOSIS — Z79811 Long term (current) use of aromatase inhibitors: Secondary | ICD-10-CM | POA: Diagnosis not present

## 2018-04-27 DIAGNOSIS — M810 Age-related osteoporosis without current pathological fracture: Secondary | ICD-10-CM | POA: Insufficient documentation

## 2018-04-27 DIAGNOSIS — Z170 Estrogen receptor positive status [ER+]: Secondary | ICD-10-CM | POA: Insufficient documentation

## 2018-04-27 DIAGNOSIS — C50212 Malignant neoplasm of upper-inner quadrant of left female breast: Secondary | ICD-10-CM | POA: Diagnosis not present

## 2018-04-27 MED ORDER — ANASTROZOLE 1 MG PO TABS: 1.0000 mg | ORAL_TABLET | Freq: Every day | ORAL | 3 refills | Status: DC

## 2018-04-27 MED ORDER — VITAMIN D3 125 MCG (5000 UT) PO TABS: 5000.0000 [IU] | ORAL_TABLET | Freq: Every day | ORAL | Status: DC

## 2018-04-27 NOTE — Telephone Encounter (Signed)
Gave patient avs and calendar.   °

## 2018-04-27 NOTE — Progress Notes (Signed)
Patient Care Team: Shirline Frees, MD as PCP - General (Family Medicine) Nicholas Lose, MD as Consulting Physician (Hematology and Oncology) Excell Seltzer, MD as Consulting Physician (General Surgery) Delice Bison Charlestine Massed, NP as Nurse Practitioner (Hematology and Oncology)  DIAGNOSIS:  Encounter Diagnosis  Name Primary?  . Malignant neoplasm of upper-inner quadrant of left breast in female, estrogen receptor positive ([place])     SUMMARY OF ONCOLOGIC HISTORY:   Malignant neoplasm of upper-inner quadrant of left breast in female, estrogen receptor positive ([place])   10/13/2017 Initial Diagnosis    Left breast palpable mass and calcifications which are lateral and anterior to the mass, ultrasound 1.5 cm at 9:00, satellite mass, 3 additional masses 5 mm, 4 mm, 4 mm not biopsied.  Biopsy of calcifications: DCIS ER 100%, PR 100%; biopsy of the mass IDC grade 2 with DCIS ER 90%, PR 100%, Ki-67 30%, HER-2 negative ratio 1.83, T1 cN0 stage I a clinical stage      10/31/2017 Surgery    Left breast mastectomy ([facility]): IDC, grade 2, 1.6 cm, margins negative, no SLNB      10/2017 -  Anti-estrogen oral therapy    Letrozole daily      02/04/2018 Cancer Staging    Staging form: Breast, AJCC 8th Edition - Pathologic: Stage IA (pT1c, pN0, cM0, G2, ER+, PR+, HER2-) - Signed by Gardenia Phlegm, NP on 02/04/2018       CHIEF COMPLIANT: Follow-up on letrozole therapy  INTERVAL HISTORY: Jamie Beltran is a 83 year old with above-mentioned history of left breast cancer currently on adjuvant letrozole therapy.  She appears to be tolerating letrozole extremely well with exception of hair loss as well as continuing hot flashes.  She is always had hot flashes and it appears that these hot flashes are slightly worse.  She would like to know if she could be switched to a different medication.  REVIEW OF SYSTEMS:   Constitutional: Denies fevers, chills or abnormal weight loss Eyes: Denies  blurriness of vision Ears, nose, mouth, throat, and face: Denies mucositis or sore throat Respiratory: Denies cough, dyspnea or wheezes Cardiovascular: Denies palpitation, chest discomfort Gastrointestinal:  Denies nausea, heartburn or change in bowel habits Skin: Denies abnormal skin rashes Lymphatics: Denies new lymphadenopathy or easy bruising Neurological:Denies numbness, tingling or new weaknesses Behavioral/Psych: Mood is stable, no new changes  Extremities: No lower extremity edema Breast:  denies any pain or lumps or nodules in either breasts All other systems were reviewed with the patient and are negative.  I have reviewed the past medical history, past surgical history, social history and family history with the patient and they are unchanged from previous note.  ALLERGIES:  is allergic to codeine; penicillins; adhesive [tape]; and protonix [pantoprazole sodium].  MEDICATIONS:  Current Outpatient Medications  Medication Sig Dispense Refill  . alendronate (FOSAMAX) 70 MG tablet Take 1 tablet (70 mg total) by mouth once a week. Take with a full glass of water on an empty stomach. 12 tablet 3  . aspirin EC 81 MG tablet Take 81 mg by mouth daily.    . diclofenac sodium (VOLTAREN) 1 % GEL Apply topically as needed.    . gabapentin (NEURONTIN) 300 MG capsule Take 300 mg by mouth 2 (two) times daily.     Marland Kitchen HYDROcodone-acetaminophen (NORCO) 10-325 MG per tablet Take 1 tablet by mouth every 6 (six) hours as needed for moderate pain.     Marland Kitchen letrozole (FEMARA) 2.5 MG tablet Take 1 tablet (2.5 mg total) by  mouth daily. (Patient taking differently: Take 2.5 mg by mouth at bedtime. ) 90 tablet 3  . levothyroxine (SYNTHROID, LEVOTHROID) 100 MCG tablet Take 100 mcg by mouth daily before breakfast.    . Multiple Vitamins-Minerals (HAIR/SKIN/NAILS) CAPS Take by mouth daily.    Marland Kitchen omeprazole (PRILOSEC) 40 MG capsule Take 40 mg by mouth daily.    . ranitidine (ZANTAC) 300 MG capsule Take 300 mg by  mouth every evening.     No current facility-administered medications for this visit.     PHYSICAL EXAMINATION: ECOG PERFORMANCE STATUS: 1 - Symptomatic but completely ambulatory  Vitals:   04/27/18 1044  BP: (!) 139/58  Pulse: 68  Resp: 17  Temp: 97.6 F (36.4 C)  SpO2: 100%   Filed Weights   04/27/18 1044  Weight: 164 lb 3.2 oz (74.5 kg)    GENERAL:alert, no distress and comfortable SKIN: skin color, texture, turgor are normal, no rashes or significant lesions EYES: normal, Conjunctiva are pink and non-injected, sclera clear OROPHARYNX:no exudate, no erythema and lips, buccal mucosa, and tongue normal  NECK: supple, thyroid normal size, non-tender, without nodularity LYMPH:  no palpable lymphadenopathy in the cervical, axillary or inguinal LUNGS: clear to auscultation and percussion with normal breathing effort HEART: regular rate & rhythm and no murmurs and no lower extremity edema ABDOMEN:abdomen soft, non-tender and normal bowel sounds MUSCULOSKELETAL:no cyanosis of digits and no clubbing  NEURO: alert & oriented x 3 with fluent speech, no focal motor/sensory deficits EXTREMITIES: No lower extremity edema BREAST: No palpable masses or nodules in either right or left breasts. No palpable axillary supraclavicular or infraclavicular adenopathy no breast tenderness or nipple discharge. (exam performed in the presence of a chaperone)  LABORATORY DATA:  I have reviewed the data as listed CMP Latest Ref Rng & Units 10/22/2017 05/29/2016 05/28/2016  Glucose 70 - 140 mg/dL 127 88 144(H)  BUN 7 - 26 mg/dL _0 Creatinine 0.60 - 1.10 mg/dL 0.94 0.73 0.82  Sodium 136 - 145 mmol/L 139 142 138  Potassium 3.5 - 5.1 mmol/L 4.3 4.1 4.0  Chloride 98 - 109 mmol/L 103 110 107  CO2 22 - 29 mmol/L _1 Calcium 8.4 - 10.4 mg/dL 9.6 9.6 9.2  Total Protein 6.4 - 8.3 g/dL 7.4 - -  Total Bilirubin 0.2 - 1.2 mg/dL 1.0 - -  Alkaline Phos 40 - 150 U/L 80 - -  AST 5 - 34 U/L 20 - -  ALT  0 - 55 U/L 23 - -    Lab Results  Component Value Date   WBC 6.6 10/22/2017   HGB 13.0 10/22/2017   HCT 40.8 10/22/2017   MCV 91.7 10/22/2017   PLT 183 10/22/2017   NEUTROABS 3.6 10/22/2017    ASSESSMENT & PLAN:  Malignant neoplasm of upper-inner quadrant of left breast in female, estrogen receptor positive ([place]) Left Mastectomy: 10/31/2017: IDC grade 2 1.6 cm 0/3 lymph nodes negative, ER PR positive HER-2 negative with Ki-67 of 30%, T1 cN0 stage I a  Recommendation: 1.  Letrozole 2.5 mg daily times 5 years started February 2019 2. Osteoporosis: Bone density done recently showed a T score of -2.7 suggestive of osteoporosis.  I started the patient on Fosamax 70 mg once a week along with calcium and vitamin D.    Letrozole Toxicities: Complains of hair loss.  She also complains of hot flashes. We discussed different options and decided that we could switch her to anastrozole.  I sent a new  prescription for anastrozole today.   Breast Cancer Surveillance:  1. Breast Exam: 04/27/18 2. Mammogram: sch for Jan 2020    No orders of the defined types were placed in this encounter.  The patient has a good understanding of the overall plan. she agrees with it. she will call with any problems that may develop before the next visit here.   Harriette Ohara, MD 04/27/18

## 2018-06-19 DIAGNOSIS — E7800 Pure hypercholesterolemia, unspecified: Secondary | ICD-10-CM | POA: Diagnosis not present

## 2018-06-19 DIAGNOSIS — K219 Gastro-esophageal reflux disease without esophagitis: Secondary | ICD-10-CM | POA: Diagnosis not present

## 2018-06-19 DIAGNOSIS — L989 Disorder of the skin and subcutaneous tissue, unspecified: Secondary | ICD-10-CM | POA: Diagnosis not present

## 2018-06-19 DIAGNOSIS — M170 Bilateral primary osteoarthritis of knee: Secondary | ICD-10-CM | POA: Diagnosis not present

## 2018-06-19 DIAGNOSIS — E039 Hypothyroidism, unspecified: Secondary | ICD-10-CM | POA: Diagnosis not present

## 2018-06-19 DIAGNOSIS — G894 Chronic pain syndrome: Secondary | ICD-10-CM | POA: Diagnosis not present

## 2018-06-19 DIAGNOSIS — Z23 Encounter for immunization: Secondary | ICD-10-CM | POA: Diagnosis not present

## 2018-06-19 DIAGNOSIS — M545 Low back pain: Secondary | ICD-10-CM | POA: Diagnosis not present

## 2018-06-19 DIAGNOSIS — C50912 Malignant neoplasm of unspecified site of left female breast: Secondary | ICD-10-CM | POA: Diagnosis not present

## 2018-07-03 DIAGNOSIS — D485 Neoplasm of uncertain behavior of skin: Secondary | ICD-10-CM | POA: Diagnosis not present

## 2018-07-03 DIAGNOSIS — D234 Other benign neoplasm of skin of scalp and neck: Secondary | ICD-10-CM | POA: Diagnosis not present

## 2018-07-03 DIAGNOSIS — L821 Other seborrheic keratosis: Secondary | ICD-10-CM | POA: Diagnosis not present

## 2018-07-03 DIAGNOSIS — L570 Actinic keratosis: Secondary | ICD-10-CM | POA: Diagnosis not present

## 2018-07-07 DIAGNOSIS — C50912 Malignant neoplasm of unspecified site of left female breast: Secondary | ICD-10-CM | POA: Diagnosis not present

## 2018-07-07 DIAGNOSIS — Z170 Estrogen receptor positive status [ER+]: Secondary | ICD-10-CM | POA: Diagnosis not present

## 2018-08-03 DIAGNOSIS — L570 Actinic keratosis: Secondary | ICD-10-CM | POA: Diagnosis not present

## 2018-08-03 DIAGNOSIS — L814 Other melanin hyperpigmentation: Secondary | ICD-10-CM | POA: Diagnosis not present

## 2018-08-03 DIAGNOSIS — D225 Melanocytic nevi of trunk: Secondary | ICD-10-CM | POA: Diagnosis not present

## 2018-08-03 DIAGNOSIS — L821 Other seborrheic keratosis: Secondary | ICD-10-CM | POA: Diagnosis not present

## 2018-08-03 DIAGNOSIS — D1801 Hemangioma of skin and subcutaneous tissue: Secondary | ICD-10-CM | POA: Diagnosis not present

## 2018-08-12 ENCOUNTER — Ambulatory Visit: Payer: PPO | Admitting: Hematology and Oncology

## 2018-08-17 DIAGNOSIS — C50912 Malignant neoplasm of unspecified site of left female breast: Secondary | ICD-10-CM | POA: Diagnosis not present

## 2018-08-21 ENCOUNTER — Other Ambulatory Visit: Payer: Self-pay

## 2018-08-21 ENCOUNTER — Emergency Department (HOSPITAL_BASED_OUTPATIENT_CLINIC_OR_DEPARTMENT_OTHER)
Admission: EM | Admit: 2018-08-21 | Discharge: 2018-08-21 | Disposition: A | Discharge status: Home / Self Care | Payer: PPO | Attending: Emergency Medicine | Admitting: Emergency Medicine

## 2018-08-21 ENCOUNTER — Encounter (HOSPITAL_BASED_OUTPATIENT_CLINIC_OR_DEPARTMENT_OTHER): Payer: Self-pay | Admitting: *Deleted

## 2018-08-21 ENCOUNTER — Emergency Department (HOSPITAL_BASED_OUTPATIENT_CLINIC_OR_DEPARTMENT_OTHER): Payer: PPO

## 2018-08-21 DIAGNOSIS — Y92009 Unspecified place in unspecified non-institutional (private) residence as the place of occurrence of the external cause: Secondary | ICD-10-CM | POA: Insufficient documentation

## 2018-08-21 DIAGNOSIS — I82409 Acute embolism and thrombosis of unspecified deep veins of unspecified lower extremity: Secondary | ICD-10-CM | POA: Diagnosis not present

## 2018-08-21 DIAGNOSIS — S8011XA Contusion of right lower leg, initial encounter: Secondary | ICD-10-CM

## 2018-08-21 DIAGNOSIS — Y998 Other external cause status: Secondary | ICD-10-CM | POA: Insufficient documentation

## 2018-08-21 DIAGNOSIS — Z87891 Personal history of nicotine dependence: Secondary | ICD-10-CM | POA: Diagnosis not present

## 2018-08-21 DIAGNOSIS — Z853 Personal history of malignant neoplasm of breast: Secondary | ICD-10-CM | POA: Diagnosis not present

## 2018-08-21 DIAGNOSIS — M79604 Pain in right leg: Secondary | ICD-10-CM | POA: Diagnosis not present

## 2018-08-21 DIAGNOSIS — Z79899 Other long term (current) drug therapy: Secondary | ICD-10-CM | POA: Insufficient documentation

## 2018-08-21 DIAGNOSIS — W010XXA Fall on same level from slipping, tripping and stumbling without subsequent striking against object, initial encounter: Secondary | ICD-10-CM | POA: Insufficient documentation

## 2018-08-21 DIAGNOSIS — Y9389 Activity, other specified: Secondary | ICD-10-CM | POA: Diagnosis not present

## 2018-08-21 DIAGNOSIS — E785 Hyperlipidemia, unspecified: Secondary | ICD-10-CM | POA: Insufficient documentation

## 2018-08-21 DIAGNOSIS — E663 Overweight: Secondary | ICD-10-CM | POA: Diagnosis not present

## 2018-08-21 DIAGNOSIS — R600 Localized edema: Secondary | ICD-10-CM | POA: Diagnosis not present

## 2018-08-21 NOTE — ED Notes (Signed)
Pt to US at this time.

## 2018-08-21 NOTE — ED Triage Notes (Signed)
She fell a week ago with injury to her right lower leg. She had an xray tonight at [facility] and was told to come here to r/o DVT.

## 2018-08-21 NOTE — ED Provider Notes (Signed)
[facility] EMERGENCY DEPARTMENT Provider Note   CSN: [id number] Arrival date & time: 08/21/18  [place]     History   Chief Complaint Chief Complaint  Patient presents with  . Leg Injury    HPI Jamie Beltran is a 83 y.o. female.  Pt presents to the ED today with right leg pain.  The pt tripped over her cat on 11/22 and fell, hitting her right leg.  It is still hurting.  She went to [facility] tonight and had a negative xray.  The pt was sent here for a leg [place] to r/o DVT.  Pt denies sob/cp.  She has been able to ambulate.     Past Medical History:  Diagnosis Date  . Basal cell carcinoma   . Blood in stool   . Breast cancer ([place]) 09/2017   left breast  . Chronic back pain   . Colonic polyp   . Diverticulitis   . Diverticulosis   . Esophageal reflux   . Fibrocystic breast disease   . GERD (gastroesophageal reflux disease) 05/29/2016  . Hiatal hernia   . Hyperlipidemia 05/29/2016  . Hypothyroidism 05/29/2016  . Migraine 05/29/2016  . Migraine headache   . OA (osteoarthritis)    back, hips, knees  . Other and unspecified hyperlipidemia     Patient Active Problem List   Diagnosis Date Noted  . Stage I breast cancer, left ([place]) 10/31/2017  . Malignant neoplasm of upper-inner quadrant of left breast in female, estrogen receptor positive ([place]) 10/20/2017  . Hypothyroidism 05/29/2016  . GERD (gastroesophageal reflux disease) 05/29/2016  . Hyperlipidemia 05/29/2016  . Migraine 05/29/2016  . Pain in the chest 05/28/2016  . DVT (deep venous thrombosis) ([place]) 07/14/2014  . Back pain 07/14/2014  . Vasovagal syncope 05/19/2014    Past Surgical History:  Procedure Laterality Date  . ABDOMINAL HYSTERECTOMY    . BLADDER SUSPENSION  1986  . CARDIAC CATHETERIZATION N/A 05/29/2016   Procedure: Left Heart Cath and Coronary Angiography;  Surgeon: Sherren Mocha, MD;  Location: [facility];  Service: Cardiovascular;  Laterality: N/A;  . CATARACT EXTRACTION, BILATERAL    .  CHOLECYSTECTOMY  2002  . COLONOSCOPY  04/08  . ESOPHAGOGASTRODUODENOSCOPY  9/06, 4/08  . FOOT SURGERY  1990  . HEEL SPUR EXCISION  2001  . KNEE ARTHROSCOPY Bilateral 2006, 2013  . TOTAL ABDOMINAL HYSTERECTOMY W/ BILATERAL SALPINGOOPHORECTOMY  1981  . TOTAL MASTECTOMY Left 10/31/2017   Procedure: LEFT TOTAL MASTECTOMY;  Surgeon: Excell Seltzer, MD;  Location: [facility];  Service: General;  Laterality: Left;  Marland Kitchen VEIN LIGATION Left 1980     OB History   None      Home Medications    Prior to Admission medications   Medication Sig Start Date End Date Taking? Authorizing Provider  alendronate (FOSAMAX) 70 MG tablet Take 1 tablet (70 mg total) by mouth once a week. Take with a full glass of water on an empty stomach. 11/10/17   Nicholas Lose, MD  anastrozole (ARIMIDEX) 1 MG tablet Take 1 tablet (1 mg total) by mouth daily. 04/27/18   Nicholas Lose, MD  cholecalciferol 5000 units TABS Take 1 tablet (5,000 Units total) by mouth daily. 04/27/18   Nicholas Lose, MD  diclofenac sodium (VOLTAREN) 1 % GEL Apply topically as needed.    [provider]  gabapentin (NEURONTIN) 300 MG capsule Take 300 mg by mouth 2 (two) times daily.     [provider]  HYDROcodone-acetaminophen [facility]) [phone]  MG per tablet Take 1 tablet by mouth every 6 (six) hours as needed for moderate pain.     [provider]  levothyroxine (SYNTHROID, LEVOTHROID) 100 MCG tablet Take 100 mcg by mouth daily before breakfast.    [provider]  Multiple Vitamins-Minerals (HAIR/SKIN/NAILS) CAPS Take by mouth daily.    [provider]  omeprazole (PRILOSEC) 40 MG capsule Take 40 mg by mouth daily.    [provider]  ranitidine (ZANTAC) 300 MG capsule Take 300 mg by mouth every evening.    [provider]    Family History Family History  Problem Relation Age of Onset  . Hypertension Father   . Heart attack Mother   . Kidney Stones Sister         kidney problems  . Kidney Stones Sister        kidney transplant  . Breast cancer Maternal Aunt   . Lung cancer Maternal Aunt     Social History Social History   Tobacco Use  . Smoking status: Former Smoker    Packs/day: 1.00    Years: 20.00    Pack years: 20.00    Types: Cigarettes    Last attempt to quit: 09/23/1980    Years since quitting: 37.9  . Smokeless tobacco: Never Used  Substance Use Topics  . Alcohol use: Yes    Comment: rarely  . Drug use: No     Allergies   Codeine; Penicillins; Adhesive [tape]; and Protonix [pantoprazole sodium]   Review of Systems Review of Systems  Musculoskeletal:       Right leg pain  All other systems reviewed and are negative.    Physical Exam Updated Vital Signs BP (!) 151/67   Pulse 86   Temp 98.3 F (36.8 C) (Oral)   Resp 20   Ht 5\' 4"  (1.626 m)   Wt 73 kg   SpO2 100%   BMI 27.64 kg/m   Physical Exam  Constitutional: She is oriented to person, place, and time. She appears well-developed and well-nourished.  HENT:  Head: Normocephalic and atraumatic.  Right Ear: External ear normal.  Left Ear: External ear normal.  Nose: Nose normal.  Mouth/Throat: Oropharynx is clear and moist.  Eyes: Pupils are equal, round, and reactive to light. Conjunctivae and EOM are normal.  Neck: Normal range of motion. Neck supple.  Cardiovascular: Normal rate, regular rhythm, normal heart sounds and intact distal pulses.  Pulmonary/Chest: Effort normal and breath sounds normal.  Abdominal: Soft. Bowel sounds are normal.  Musculoskeletal:       Legs: Neurological: She is alert and oriented to person, place, and time.  Skin: Skin is warm. Capillary refill takes less than 2 seconds.  Psychiatric: She has a normal mood and affect. Her behavior is normal. Judgment and thought content normal.  Nursing note and vitals reviewed.    ED Treatments / Results  Labs (all labs ordered are listed, but only abnormal results are  displayed) Labs Reviewed - No data to display  EKG None  Radiology US Venous Img Lower Right (dvt Study)  Result Date: 08/21/2018 CLINICAL DATA:  Right lower extremity pain edema and bruising EXAM: Right LOWER EXTREMITY VENOUS DOPPLER ULTRASOUND TECHNIQUE: Gray-scale sonography with graded compression, as well as color Doppler and duplex ultrasound were performed to evaluate the lower extremity deep venous systems from the level of the common femoral vein and including the common femoral, femoral, profunda femoral, popliteal and calf veins including the posterior tibial, peroneal and gastrocnemius  veins when visible. The superficial great saphenous vein was also interrogated. Spectral Doppler was utilized to evaluate flow at rest and with distal augmentation maneuvers in the common femoral, femoral and popliteal veins. COMPARISON:  None. FINDINGS: Contralateral Common Femoral Vein: Respiratory phasicity is normal and symmetric with the symptomatic side. No evidence of thrombus. Normal compressibility. Common Femoral Vein: No evidence of thrombus. Normal compressibility, respiratory phasicity and response to augmentation. Saphenofemoral Junction: No evidence of thrombus. Normal compressibility and flow on color Doppler imaging. Profunda Femoral Vein: No evidence of thrombus. Normal compressibility and flow on color Doppler imaging. Femoral Vein: No evidence of thrombus. Normal compressibility, respiratory phasicity and response to augmentation. Popliteal Vein: No evidence of thrombus. Normal compressibility, respiratory phasicity and response to augmentation. Calf Veins: No evidence of thrombus. Normal compressibility and flow on color Doppler imaging. Superficial Great Saphenous Vein: No evidence of thrombus. Normal compressibility. IMPRESSION: No evidence of deep venous thrombosis. Electronically Signed   By: Donavan Foil M.D.   On: 08/21/2018 19:56    Procedures Procedures (including critical care  time)  Medications Ordered in ED Medications - No data to display   Initial Impression / Assessment and Plan / ED Course  I have reviewed the triage vital signs and the nursing notes.  Pertinent labs & imaging results that were available during my care of the patient were reviewed by me and considered in my medical decision making (see chart for details).     No DVT on [place].  Xray negative per pt report.  Pt is able to ambulate.  She knows to return if worse and to f/u with pcp.  Final Clinical Impressions(s) / ED Diagnoses   Final diagnoses:  Leg hematoma, right, initial encounter    ED Discharge Orders    None       Isla Pence, MD 08/21/18 2007

## 2018-09-01 DIAGNOSIS — C50912 Malignant neoplasm of unspecified site of left female breast: Secondary | ICD-10-CM | POA: Diagnosis not present

## 2018-10-12 ENCOUNTER — Ambulatory Visit
Admission: RE | Admit: 2018-10-12 | Discharge: 2018-10-12 | Disposition: A | Discharge status: Home / Self Care | Payer: PPO | Source: Ambulatory Visit | Attending: Adult Health | Admitting: Adult Health

## 2018-10-12 DIAGNOSIS — Z1231 Encounter for screening mammogram for malignant neoplasm of breast: Secondary | ICD-10-CM

## 2018-10-28 DIAGNOSIS — M47816 Spondylosis without myelopathy or radiculopathy, lumbar region: Secondary | ICD-10-CM | POA: Diagnosis not present

## 2018-10-28 DIAGNOSIS — M4316 Spondylolisthesis, lumbar region: Secondary | ICD-10-CM | POA: Diagnosis not present

## 2018-10-29 ENCOUNTER — Inpatient Hospital Stay: Payer: PPO | Admitting: Hematology and Oncology

## 2018-10-29 ENCOUNTER — Telehealth: Payer: Self-pay | Admitting: Hematology and Oncology

## 2018-10-29 NOTE — Assessment & Plan Note (Signed)
Left Mastectomy:10/31/2017: IDC grade 2 1.6 cm 0/3 lymph nodes negative, ER PR positive HER-2 negative with Ki-67 of 30%, T1 cN0 stage I a  Recommendation: 1.Letrozole 2.5 mg daily times 5 years started February 2019, switched to anastrozole 04/27/2018 2.Osteoporosis: Bone density done recently showed a T score of -2.7 suggestive of osteoporosis. I started the patient on Fosamax 70 mg once a week along with calcium and vitamin D.   Anastrozole toxicities:  Breast Cancer Surveillance:  1. Breast Exam: 04/27/18 2. Mammogram: sch for Jan 2020  Return to clinic in 1 year for follow-up 

## 2018-10-29 NOTE — Telephone Encounter (Signed)
Called pt per 2/6 sch message - left message for patient to call back to r/s

## 2018-11-03 DIAGNOSIS — L814 Other melanin hyperpigmentation: Secondary | ICD-10-CM | POA: Diagnosis not present

## 2018-11-03 DIAGNOSIS — L821 Other seborrheic keratosis: Secondary | ICD-10-CM | POA: Diagnosis not present

## 2018-11-03 DIAGNOSIS — L820 Inflamed seborrheic keratosis: Secondary | ICD-10-CM | POA: Diagnosis not present

## 2018-11-03 DIAGNOSIS — L918 Other hypertrophic disorders of the skin: Secondary | ICD-10-CM | POA: Diagnosis not present

## 2018-11-03 DIAGNOSIS — D229 Melanocytic nevi, unspecified: Secondary | ICD-10-CM | POA: Diagnosis not present

## 2018-11-10 NOTE — Progress Notes (Signed)
 Patient Care Team: Harris, William, MD as PCP - General (Family Medicine) Gudena, Vinay, MD as Consulting Physician (Hematology and Oncology) Hoxworth, Benjamin, MD as Consulting Physician (General Surgery) Causey, Lindsey Cornetto, NP as Nurse Practitioner (Hematology and Oncology)  DIAGNOSIS:    ICD-10-CM   1. Malignant neoplasm of upper-inner quadrant of left breast in female, estrogen receptor positive ([facility]) C50.212    Z17.0     SUMMARY OF ONCOLOGIC HISTORY:   Malignant neoplasm of upper-inner quadrant of left breast in female, estrogen receptor positive ([facility])   10/13/2017 Initial Diagnosis    Left breast palpable mass and calcifications which are lateral and anterior to the mass, ultrasound 1.5 cm at 9:00, satellite mass, 3 additional masses 5 mm, 4 mm, 4 mm not biopsied.  Biopsy of calcifications: DCIS ER 100%, PR 100%; biopsy of the mass IDC grade 2 with DCIS ER 90%, PR 100%, Ki-67 30%, HER-2 negative ratio 1.83, T1 cN0 stage I a clinical stage    10/31/2017 Surgery    Left breast mastectomy ([facility]): IDC, grade 2, 1.6 cm, margins negative, no SLNB    10/2017 -  Anti-estrogen oral therapy    Letrozole daily switched to anastrozole 04/27/2018 due to hair loss    02/04/2018 Cancer Staging    Staging form: Breast, AJCC 8th Edition - Pathologic: Stage IA (pT1c, pN0, cM0, G2, ER+, PR+, HER2-) - Signed by Causey, Lindsey Cornetto, NP on 02/04/2018     CHIEF COMPLIANT: Follow-up on letrozole therapy  INTERVAL HISTORY: Jamie Beltran is a 84 y.o. with above-mentioned history of left breast cancer currently on adjuvant anastrozole therapy after hair loss on letrozole. Her most recent mammogram on 10/12/18 showed no evidence of malignancy. She presents to the clinic alone today. She reports her hair thinning has stopped. She has moderate hot flashes that have worsened, especially at night with sweating, since she began letrozole. She had joint and muscle aches, so she stopped fosamax and  they resolved. She reports lower back pain for which she receives injections. She had a breast exam done by Lindsey Causey, NP in 01/2018.   REVIEW OF SYSTEMS:   Constitutional: Denies fevers, chills or abnormal weight loss (+) hot flashes Eyes: Denies blurriness of vision Ears, nose, mouth, throat, and face: Denies mucositis or sore throat Respiratory: Denies cough, dyspnea or wheezes Cardiovascular: Denies palpitation, chest discomfort Gastrointestinal: Denies nausea, heartburn or change in bowel habits Skin: Denies abnormal skin rashes MSK: (+) low back pain (+) myalgias, resolved Lymphatics: Denies new lymphadenopathy or easy bruising Neurological: Denies numbness, tingling or new weaknesses Behavioral/Psych: Mood is stable, no new changes  Extremities: No lower extremity edema Breast: denies any pain or lumps or nodules in either breasts All other systems were reviewed with the patient and are negative.  I have reviewed the past medical history, past surgical history, social history and family history with the patient and they are unchanged from previous note.  ALLERGIES:  is allergic to codeine; penicillins; adhesive [tape]; and protonix [pantoprazole sodium].  MEDICATIONS:  Current Outpatient Medications  Medication Sig Dispense Refill  . anastrozole (ARIMIDEX) 1 MG tablet Take 1 tablet (1 mg total) by mouth daily. 90 tablet 3  . cholecalciferol 5000 units TABS Take 1 tablet (5,000 Units total) by mouth daily.    . diclofenac sodium (VOLTAREN) 1 % GEL Apply topically as needed.    . gabapentin (NEURONTIN) 300 MG capsule Take 300 mg by mouth 2 (two) times daily.     . HYDROcodone-acetaminophen (NORCO)   10-325 MG per tablet Take 1 tablet by mouth every 6 (six) hours as needed for moderate pain.     . levothyroxine (SYNTHROID, LEVOTHROID) 100 MCG tablet Take 100 mcg by mouth daily before breakfast.    . Multiple Vitamins-Minerals (HAIR/SKIN/NAILS) CAPS Take by mouth daily.    .  omeprazole (PRILOSEC) 40 MG capsule Take 40 mg by mouth daily.     No current facility-administered medications for this visit.     PHYSICAL EXAMINATION: ECOG PERFORMANCE STATUS: 1 - Symptomatic but completely ambulatory  Vitals:   11/11/18 1113  BP: (!) 135/95  Pulse: 72  Resp: 17  Temp: (!) 97.4 F (36.3 C)  SpO2: 99%   Filed Weights   11/11/18 1113  Weight: 163 lb (73.9 kg)    GENERAL: alert, no distress and comfortable SKIN: skin color, texture, turgor are normal, no rashes or significant lesions EYES: normal, Conjunctiva are pink and non-injected, sclera clear OROPHARYNX: no exudate, no erythema and lips, buccal mucosa, and tongue normal  NECK: supple, thyroid normal size, non-tender, without nodularity LYMPH: no palpable lymphadenopathy in the cervical, axillary or inguinal LUNGS: clear to auscultation and percussion with normal breathing effort HEART: regular rate & rhythm and no murmurs and no lower extremity edema ABDOMEN: abdomen soft, non-tender and normal bowel sounds MUSCULOSKELETAL: no cyanosis of digits and no clubbing  NEURO: alert & oriented x 3 with fluent speech, no focal motor/sensory deficits EXTREMITIES: No lower extremity edema  LABORATORY DATA:  I have reviewed the data as listed CMP Latest Ref Rng & Units 10/22/2017 05/29/2016 05/28/2016  Glucose 70 - 140 mg/dL 127 88 144(H)  BUN 7 - 26 mg/dL 14 9 11  Creatinine 0.60 - 1.10 mg/dL 0.94 0.73 0.82  Sodium 136 - 145 mmol/L 139 142 138  Potassium 3.5 - 5.1 mmol/L 4.3 4.1 4.0  Chloride 98 - 109 mmol/L 103 110 107  CO2 22 - 29 mmol/L 26 28 26  Calcium 8.4 - 10.4 mg/dL 9.6 9.6 9.2  Total Protein 6.4 - 8.3 g/dL 7.4 - -  Total Bilirubin 0.2 - 1.2 mg/dL 1.0 - -  Alkaline Phos 40 - 150 U/L 80 - -  AST 5 - 34 U/L 20 - -  ALT 0 - 55 U/L 23 - -    Lab Results  Component Value Date   WBC 6.6 10/22/2017   HGB 13.0 10/22/2017   HCT 40.8 10/22/2017   MCV 91.7 10/22/2017   PLT 183 10/22/2017   NEUTROABS 3.6  10/22/2017    ASSESSMENT & PLAN:  Malignant neoplasm of upper-inner quadrant of left breast in female, estrogen receptor positive ([facility]) Left Mastectomy:10/31/2017: IDC grade 2 1.6 cm 0/3 lymph nodes negative, ER PR positive HER-2 negative with Ki-67 of 30%, T1 cN0 stage I a  Recommendation: 1.Letrozole 2.5 mg daily times 5 years started February 2019, switched to anastrozole 04/27/2018 2.Osteoporosis: Bone density done recently showed a T score of -2.7 suggestive of osteoporosis. I started the patient on Fosamax 70 mg once a week along with calcium and vitamin D.   Anastrozole toxicities: Denies any hot flashes or myalgias  Breast Cancer Surveillance:  1. Breast Exam: 04/27/18 2. Mammogram: sch for Jan 2020  Return to clinic in 1 year for follow-up  No orders of the defined types were placed in this encounter.  The patient has a good understanding of the overall plan. she agrees with it. she will call with any problems that may develop before the next visit here.  Gudena,   Vinay, MD 11/11/2018  I, Molly Dorshimer am acting as scribe for Dr. Vinay Gudena.  I have reviewed the above documentation for accuracy and completeness, and I agree with the above.       

## 2018-11-11 ENCOUNTER — Inpatient Hospital Stay: Payer: PPO | Attending: Hematology and Oncology | Admitting: Hematology and Oncology

## 2018-11-11 ENCOUNTER — Telehealth: Payer: Self-pay | Admitting: Hematology and Oncology

## 2018-11-11 DIAGNOSIS — C50212 Malignant neoplasm of upper-inner quadrant of left female breast: Secondary | ICD-10-CM

## 2018-11-11 DIAGNOSIS — M810 Age-related osteoporosis without current pathological fracture: Secondary | ICD-10-CM

## 2018-11-11 DIAGNOSIS — Z79811 Long term (current) use of aromatase inhibitors: Secondary | ICD-10-CM | POA: Diagnosis not present

## 2018-11-11 DIAGNOSIS — N951 Menopausal and female climacteric states: Secondary | ICD-10-CM | POA: Diagnosis not present

## 2018-11-11 DIAGNOSIS — Z170 Estrogen receptor positive status [ER+]: Secondary | ICD-10-CM | POA: Diagnosis not present

## 2018-11-11 MED ORDER — ANASTROZOLE 1 MG PO TABS: 1.0000 mg | ORAL_TABLET | Freq: Every day | ORAL | 3 refills | Status: DC

## 2018-11-11 NOTE — Assessment & Plan Note (Signed)
Left Mastectomy:10/31/2017: IDC grade 2 1.6 cm 0/3 lymph nodes negative, ER PR positive HER-2 negative with Ki-67 of 30%, T1 cN0 stage I a  Recommendation: 1.Letrozole 2.5 mg daily times 5 years started February 2019, switched to anastrozole 04/27/2018 2.Osteoporosis: Bone density done recently showed a T score of -2.7 suggestive of osteoporosis. I started the patient on Fosamax 70 mg once a week along with calcium and vitamin D.   Anastrozole toxicities:  Breast Cancer Surveillance:  1. Breast Exam: 04/27/18 2. Mammogram: sch for Jan 2020  Return to clinic in 1 year for follow-up

## 2018-11-11 NOTE — Telephone Encounter (Signed)
Gave avs and calendar ° °

## 2018-12-18 DIAGNOSIS — I700 Atherosclerosis of aorta: Secondary | ICD-10-CM | POA: Diagnosis not present

## 2018-12-18 DIAGNOSIS — K219 Gastro-esophageal reflux disease without esophagitis: Secondary | ICD-10-CM | POA: Diagnosis not present

## 2018-12-18 DIAGNOSIS — G894 Chronic pain syndrome: Secondary | ICD-10-CM | POA: Diagnosis not present

## 2018-12-18 DIAGNOSIS — C50912 Malignant neoplasm of unspecified site of left female breast: Secondary | ICD-10-CM | POA: Diagnosis not present

## 2018-12-18 DIAGNOSIS — E7800 Pure hypercholesterolemia, unspecified: Secondary | ICD-10-CM | POA: Diagnosis not present

## 2018-12-18 DIAGNOSIS — M170 Bilateral primary osteoarthritis of knee: Secondary | ICD-10-CM | POA: Diagnosis not present

## 2018-12-18 DIAGNOSIS — M545 Low back pain: Secondary | ICD-10-CM | POA: Diagnosis not present

## 2018-12-18 DIAGNOSIS — E039 Hypothyroidism, unspecified: Secondary | ICD-10-CM | POA: Diagnosis not present

## 2019-01-04 DIAGNOSIS — K7460 Unspecified cirrhosis of liver: Secondary | ICD-10-CM | POA: Diagnosis not present

## 2019-01-04 DIAGNOSIS — K7290 Hepatic failure, unspecified without coma: Secondary | ICD-10-CM | POA: Diagnosis not present

## 2019-01-04 DIAGNOSIS — E1129 Type 2 diabetes mellitus with other diabetic kidney complication: Secondary | ICD-10-CM | POA: Diagnosis not present

## 2019-01-04 DIAGNOSIS — Z6832 Body mass index (BMI) 32.0-32.9, adult: Secondary | ICD-10-CM | POA: Diagnosis not present

## 2019-01-04 DIAGNOSIS — I10 Essential (primary) hypertension: Secondary | ICD-10-CM | POA: Diagnosis not present

## 2019-01-04 DIAGNOSIS — M545 Low back pain: Secondary | ICD-10-CM | POA: Diagnosis not present

## 2019-01-04 DIAGNOSIS — M5416 Radiculopathy, lumbar region: Secondary | ICD-10-CM | POA: Diagnosis not present

## 2019-01-04 DIAGNOSIS — M4316 Spondylolisthesis, lumbar region: Secondary | ICD-10-CM | POA: Diagnosis not present

## 2019-01-04 DIAGNOSIS — E039 Hypothyroidism, unspecified: Secondary | ICD-10-CM | POA: Diagnosis not present

## 2019-02-17 DIAGNOSIS — C50912 Malignant neoplasm of unspecified site of left female breast: Secondary | ICD-10-CM | POA: Diagnosis not present

## 2019-02-23 DIAGNOSIS — C50912 Malignant neoplasm of unspecified site of left female breast: Secondary | ICD-10-CM | POA: Diagnosis not present

## 2019-03-25 ENCOUNTER — Telehealth: Payer: Self-pay

## 2019-03-25 DIAGNOSIS — C50212 Malignant neoplasm of upper-inner quadrant of left female breast: Secondary | ICD-10-CM

## 2019-03-25 NOTE — Telephone Encounter (Signed)
RN spoke with patient regarding new lump found 1 week ago in left axillary.  Pt reports small lump, no redness.  Firm to touch per patient. Sore to the touch.     Hx of left breast mastectomy.  Per MD recommendations ultrasound will be scheduled for evaluation. Pt voiced agreement and understanding.

## 2019-03-25 NOTE — Telephone Encounter (Signed)
RN returned call, voicemail left for return call.  

## 2019-03-25 NOTE — Telephone Encounter (Signed)
"  Jamie Beltran (M: [phone]).  Calling for an appointment with Dr. Lindi Adie.  I found a firm lump under my left arm which is the same side of my mastectomy.  Discovered lump about a week ago.  No pain, just feels sore to touch.  I press to see if it is still there.  It is pretty deep.  My underarm area looks regular.  No redness, skin tears or open wounds with drainage.  Looks swollen, kind of fat, I'm not sure.  Request appointment as soon as possible after my COVID-19 test scheduled on July 7th.  Land line problem with no dial tone so please call mobile."

## 2019-03-30 DIAGNOSIS — Z03818 Encounter for observation for suspected exposure to other biological agents ruled out: Secondary | ICD-10-CM | POA: Diagnosis not present

## 2019-04-02 ENCOUNTER — Other Ambulatory Visit: Payer: Self-pay

## 2019-04-02 MED ORDER — ANASTROZOLE 1 MG PO TABS: 1.0000 mg | ORAL_TABLET | Freq: Every day | ORAL | 3 refills | Status: DC

## 2019-04-12 DIAGNOSIS — E785 Hyperlipidemia, unspecified: Secondary | ICD-10-CM | POA: Diagnosis not present

## 2019-04-12 DIAGNOSIS — M170 Bilateral primary osteoarthritis of knee: Secondary | ICD-10-CM | POA: Diagnosis not present

## 2019-04-12 DIAGNOSIS — E039 Hypothyroidism, unspecified: Secondary | ICD-10-CM | POA: Diagnosis not present

## 2019-04-12 DIAGNOSIS — C50912 Malignant neoplasm of unspecified site of left female breast: Secondary | ICD-10-CM | POA: Diagnosis not present

## 2019-04-15 ENCOUNTER — Ambulatory Visit
Admission: RE | Admit: 2019-04-15 | Discharge: 2019-04-15 | Disposition: A | Discharge status: Home / Self Care | Payer: PPO | Source: Ambulatory Visit | Attending: Hematology and Oncology | Admitting: Hematology and Oncology

## 2019-04-15 ENCOUNTER — Other Ambulatory Visit: Payer: Self-pay | Admitting: Hematology and Oncology

## 2019-04-15 ENCOUNTER — Other Ambulatory Visit: Payer: Self-pay

## 2019-04-15 DIAGNOSIS — Z170 Estrogen receptor positive status [ER+]: Secondary | ICD-10-CM

## 2019-04-15 DIAGNOSIS — C50212 Malignant neoplasm of upper-inner quadrant of left female breast: Secondary | ICD-10-CM

## 2019-04-15 DIAGNOSIS — M79622 Pain in left upper arm: Secondary | ICD-10-CM | POA: Diagnosis not present

## 2019-04-19 DIAGNOSIS — H532 Diplopia: Secondary | ICD-10-CM | POA: Diagnosis not present

## 2019-04-19 DIAGNOSIS — H16223 Keratoconjunctivitis sicca, not specified as Sjogren's, bilateral: Secondary | ICD-10-CM | POA: Diagnosis not present

## 2019-04-19 DIAGNOSIS — Z961 Presence of intraocular lens: Secondary | ICD-10-CM | POA: Diagnosis not present

## 2019-04-19 DIAGNOSIS — H1859 Other hereditary corneal dystrophies: Secondary | ICD-10-CM | POA: Diagnosis not present

## 2019-04-19 DIAGNOSIS — H43813 Vitreous degeneration, bilateral: Secondary | ICD-10-CM | POA: Diagnosis not present

## 2019-05-14 DIAGNOSIS — C50912 Malignant neoplasm of unspecified site of left female breast: Secondary | ICD-10-CM | POA: Diagnosis not present

## 2019-05-14 DIAGNOSIS — F419 Anxiety disorder, unspecified: Secondary | ICD-10-CM | POA: Diagnosis not present

## 2019-05-14 DIAGNOSIS — E785 Hyperlipidemia, unspecified: Secondary | ICD-10-CM | POA: Diagnosis not present

## 2019-05-14 DIAGNOSIS — M170 Bilateral primary osteoarthritis of knee: Secondary | ICD-10-CM | POA: Diagnosis not present

## 2019-05-14 DIAGNOSIS — E039 Hypothyroidism, unspecified: Secondary | ICD-10-CM | POA: Diagnosis not present

## 2019-06-15 DIAGNOSIS — R600 Localized edema: Secondary | ICD-10-CM | POA: Diagnosis not present

## 2019-06-15 DIAGNOSIS — N390 Urinary tract infection, site not specified: Secondary | ICD-10-CM | POA: Diagnosis not present

## 2019-06-15 DIAGNOSIS — F4321 Adjustment disorder with depressed mood: Secondary | ICD-10-CM | POA: Diagnosis not present

## 2019-06-17 DIAGNOSIS — C50912 Malignant neoplasm of unspecified site of left female breast: Secondary | ICD-10-CM | POA: Diagnosis not present

## 2019-06-17 DIAGNOSIS — E039 Hypothyroidism, unspecified: Secondary | ICD-10-CM | POA: Diagnosis not present

## 2019-06-17 DIAGNOSIS — E7800 Pure hypercholesterolemia, unspecified: Secondary | ICD-10-CM | POA: Diagnosis not present

## 2019-06-17 DIAGNOSIS — M170 Bilateral primary osteoarthritis of knee: Secondary | ICD-10-CM | POA: Diagnosis not present

## 2019-06-17 DIAGNOSIS — E785 Hyperlipidemia, unspecified: Secondary | ICD-10-CM | POA: Diagnosis not present

## 2019-06-17 DIAGNOSIS — F4321 Adjustment disorder with depressed mood: Secondary | ICD-10-CM | POA: Diagnosis not present

## 2019-06-29 ENCOUNTER — Other Ambulatory Visit: Payer: Self-pay

## 2019-06-29 DIAGNOSIS — Z20828 Contact with and (suspected) exposure to other viral communicable diseases: Secondary | ICD-10-CM | POA: Diagnosis not present

## 2019-06-29 DIAGNOSIS — Z20822 Contact with and (suspected) exposure to covid-19: Secondary | ICD-10-CM

## 2019-07-01 LAB — NOVEL CORONAVIRUS, NAA: SARS-CoV-2, NAA: NOT DETECTED

## 2019-07-07 DIAGNOSIS — E039 Hypothyroidism, unspecified: Secondary | ICD-10-CM | POA: Diagnosis not present

## 2019-07-07 DIAGNOSIS — R600 Localized edema: Secondary | ICD-10-CM | POA: Diagnosis not present

## 2019-07-07 DIAGNOSIS — R300 Dysuria: Secondary | ICD-10-CM | POA: Diagnosis not present

## 2019-07-07 DIAGNOSIS — N760 Acute vaginitis: Secondary | ICD-10-CM | POA: Diagnosis not present

## 2019-07-07 DIAGNOSIS — F320 Major depressive disorder, single episode, mild: Secondary | ICD-10-CM | POA: Diagnosis not present

## 2019-07-07 DIAGNOSIS — E7800 Pure hypercholesterolemia, unspecified: Secondary | ICD-10-CM | POA: Diagnosis not present

## 2019-07-19 DIAGNOSIS — E039 Hypothyroidism, unspecified: Secondary | ICD-10-CM | POA: Diagnosis not present

## 2019-07-19 DIAGNOSIS — E7800 Pure hypercholesterolemia, unspecified: Secondary | ICD-10-CM | POA: Diagnosis not present

## 2019-07-19 DIAGNOSIS — M170 Bilateral primary osteoarthritis of knee: Secondary | ICD-10-CM | POA: Diagnosis not present

## 2019-07-19 DIAGNOSIS — E785 Hyperlipidemia, unspecified: Secondary | ICD-10-CM | POA: Diagnosis not present

## 2019-07-19 DIAGNOSIS — C50912 Malignant neoplasm of unspecified site of left female breast: Secondary | ICD-10-CM | POA: Diagnosis not present

## 2019-07-19 DIAGNOSIS — F320 Major depressive disorder, single episode, mild: Secondary | ICD-10-CM | POA: Diagnosis not present

## 2019-07-19 DIAGNOSIS — F4321 Adjustment disorder with depressed mood: Secondary | ICD-10-CM | POA: Diagnosis not present

## 2019-08-23 DIAGNOSIS — F320 Major depressive disorder, single episode, mild: Secondary | ICD-10-CM | POA: Diagnosis not present

## 2019-08-23 DIAGNOSIS — F4321 Adjustment disorder with depressed mood: Secondary | ICD-10-CM | POA: Diagnosis not present

## 2019-08-23 DIAGNOSIS — E785 Hyperlipidemia, unspecified: Secondary | ICD-10-CM | POA: Diagnosis not present

## 2019-08-23 DIAGNOSIS — E039 Hypothyroidism, unspecified: Secondary | ICD-10-CM | POA: Diagnosis not present

## 2019-08-23 DIAGNOSIS — E7800 Pure hypercholesterolemia, unspecified: Secondary | ICD-10-CM | POA: Diagnosis not present

## 2019-08-23 DIAGNOSIS — C50912 Malignant neoplasm of unspecified site of left female breast: Secondary | ICD-10-CM | POA: Diagnosis not present

## 2019-08-23 DIAGNOSIS — M170 Bilateral primary osteoarthritis of knee: Secondary | ICD-10-CM | POA: Diagnosis not present

## 2019-08-24 DIAGNOSIS — R208 Other disturbances of skin sensation: Secondary | ICD-10-CM | POA: Diagnosis not present

## 2019-08-24 DIAGNOSIS — L820 Inflamed seborrheic keratosis: Secondary | ICD-10-CM | POA: Diagnosis not present

## 2019-08-24 DIAGNOSIS — D1801 Hemangioma of skin and subcutaneous tissue: Secondary | ICD-10-CM | POA: Diagnosis not present

## 2019-08-24 DIAGNOSIS — L304 Erythema intertrigo: Secondary | ICD-10-CM | POA: Diagnosis not present

## 2019-08-24 DIAGNOSIS — D225 Melanocytic nevi of trunk: Secondary | ICD-10-CM | POA: Diagnosis not present

## 2019-08-24 DIAGNOSIS — L821 Other seborrheic keratosis: Secondary | ICD-10-CM | POA: Diagnosis not present

## 2019-08-24 DIAGNOSIS — L814 Other melanin hyperpigmentation: Secondary | ICD-10-CM | POA: Diagnosis not present

## 2019-09-10 DIAGNOSIS — M170 Bilateral primary osteoarthritis of knee: Secondary | ICD-10-CM | POA: Diagnosis not present

## 2019-09-10 DIAGNOSIS — E039 Hypothyroidism, unspecified: Secondary | ICD-10-CM | POA: Diagnosis not present

## 2019-09-10 DIAGNOSIS — F4321 Adjustment disorder with depressed mood: Secondary | ICD-10-CM | POA: Diagnosis not present

## 2019-09-10 DIAGNOSIS — C50912 Malignant neoplasm of unspecified site of left female breast: Secondary | ICD-10-CM | POA: Diagnosis not present

## 2019-09-10 DIAGNOSIS — F320 Major depressive disorder, single episode, mild: Secondary | ICD-10-CM | POA: Diagnosis not present

## 2019-09-10 DIAGNOSIS — E785 Hyperlipidemia, unspecified: Secondary | ICD-10-CM | POA: Diagnosis not present

## 2019-10-18 DIAGNOSIS — E7800 Pure hypercholesterolemia, unspecified: Secondary | ICD-10-CM | POA: Diagnosis not present

## 2019-10-18 DIAGNOSIS — C50912 Malignant neoplasm of unspecified site of left female breast: Secondary | ICD-10-CM | POA: Diagnosis not present

## 2019-10-18 DIAGNOSIS — E785 Hyperlipidemia, unspecified: Secondary | ICD-10-CM | POA: Diagnosis not present

## 2019-10-18 DIAGNOSIS — F4321 Adjustment disorder with depressed mood: Secondary | ICD-10-CM | POA: Diagnosis not present

## 2019-10-18 DIAGNOSIS — E039 Hypothyroidism, unspecified: Secondary | ICD-10-CM | POA: Diagnosis not present

## 2019-10-18 DIAGNOSIS — M170 Bilateral primary osteoarthritis of knee: Secondary | ICD-10-CM | POA: Diagnosis not present

## 2019-10-18 DIAGNOSIS — F320 Major depressive disorder, single episode, mild: Secondary | ICD-10-CM | POA: Diagnosis not present

## 2019-11-02 DIAGNOSIS — C50912 Malignant neoplasm of unspecified site of left female breast: Secondary | ICD-10-CM | POA: Diagnosis not present

## 2019-11-09 DIAGNOSIS — C50912 Malignant neoplasm of unspecified site of left female breast: Secondary | ICD-10-CM | POA: Diagnosis not present

## 2019-11-09 DIAGNOSIS — E039 Hypothyroidism, unspecified: Secondary | ICD-10-CM | POA: Diagnosis not present

## 2019-11-09 DIAGNOSIS — E7800 Pure hypercholesterolemia, unspecified: Secondary | ICD-10-CM | POA: Diagnosis not present

## 2019-11-09 DIAGNOSIS — F320 Major depressive disorder, single episode, mild: Secondary | ICD-10-CM | POA: Diagnosis not present

## 2019-11-09 DIAGNOSIS — E785 Hyperlipidemia, unspecified: Secondary | ICD-10-CM | POA: Diagnosis not present

## 2019-11-09 DIAGNOSIS — F4321 Adjustment disorder with depressed mood: Secondary | ICD-10-CM | POA: Diagnosis not present

## 2019-11-09 DIAGNOSIS — M170 Bilateral primary osteoarthritis of knee: Secondary | ICD-10-CM | POA: Diagnosis not present

## 2019-11-11 DIAGNOSIS — K648 Other hemorrhoids: Secondary | ICD-10-CM | POA: Diagnosis not present

## 2019-11-11 NOTE — Progress Notes (Signed)
  HEMATOLOGY-ONCOLOGY TELEPHONE VISIT PROGRESS NOTE  I connected with Jamie Beltran on 11/12/2019 at 11:30 AM EST by telephone and verified that I am speaking with the correct person using two identifiers.  I discussed the limitations, risks, security and privacy concerns of performing an evaluation and management service by telephone and the availability of in person appointments.  I also discussed with the patient that there may be a patient responsible charge related to this service. The patient expressed understanding and agreed to proceed.   History of Present Illness: PATRISIA Beltran is a 85 y.o. female with above-mentioned history of left breast cancer currently on anastrozole therapy. US of the left axilla on 10/12/18 showed no evidence of malignancy. She presents over the phone today for follow-up. Rash under the arm due to moisture. Still losing lot of hair. Back pain chronic.  Oncology History  Malignant neoplasm of upper-inner quadrant of left breast in female, estrogen receptor positive ([place])  10/13/2017 Initial Diagnosis   Left breast palpable mass and calcifications which are lateral and anterior to the mass, ultrasound 1.5 cm at 9:00, satellite mass, 3 additional masses 5 mm, 4 mm, 4 mm not biopsied.  Biopsy of calcifications: DCIS ER 100%, PR 100%; biopsy of the mass IDC grade 2 with DCIS ER 90%, PR 100%, Ki-67 30%, HER-2 negative ratio 1.83, T1 cN0 stage I a clinical stage   10/31/2017 Surgery   Left breast mastectomy ([place]): IDC, grade 2, 1.6 cm, margins negative, no SLNB   10/2017 -  Anti-estrogen oral therapy   Letrozole daily switched to anastrozole 04/27/2018 due to hair loss   02/04/2018 Cancer Staging   Staging form: Breast, AJCC 8th Edition - Pathologic: Stage IA (pT1c, pN0, cM0, G2, ER+, PR+, HER2-) - Signed by Gardenia Phlegm, NP on 02/04/2018     Observations/Objective:  No concerns for disease recurrence. Chronic back pain Hemorroids   Assessment  Plan:  Malignant neoplasm of upper-inner quadrant of left breast in female, estrogen receptor positive ([place]) Left Mastectomy:10/31/2017: IDC grade 2 1.6 cm 0/3 lymph nodes negative, ER PR positive HER-2 negative with Ki-67 of 30%, T1 cN0 stage I a  Recommendation: 1.Letrozole 2.5 mg daily times 5 yearsstarted February 2019, switched to anastrozole 04/27/2018 2.Osteoporosis: Bone density done recently showed a T score of -2.7 suggestive of osteoporosis.  Currently on Fosamax with calcium and vitamin D  Anastrozole toxicities:  Hair loss Hot flashes better on anastrozole.  Breast Cancer Surveillance: 1. Breast Exam: 04/27/18 2. Mammogram: 10/12/2018: Benign breast density category B Ultrasound left axilla 04/15/2019: No evidence of malignancy  Hemorrhoids: Needs surgery. Return to clinic in 1 year for follow-up  I discussed the assessment and treatment plan with the patient. The patient was provided an opportunity to ask questions and all were answered. The patient agreed with the plan and demonstrated an understanding of the instructions. The patient was advised to call back or seek an in-person evaluation if the symptoms worsen or if the condition fails to improve as anticipated.   I provided 15 minutes of non-face-to-face time during this encounter.   Rulon Eisenmenger, MD 11/12/2019    I, Molly Dorshimer, am acting as scribe for Nicholas Lose, MD.  I have reviewed the above documentation for accuracy and completeness, and I agree with the above.

## 2019-11-12 ENCOUNTER — Inpatient Hospital Stay: Payer: PPO | Attending: Hematology and Oncology | Admitting: Hematology and Oncology

## 2019-11-12 DIAGNOSIS — C50212 Malignant neoplasm of upper-inner quadrant of left female breast: Secondary | ICD-10-CM | POA: Diagnosis not present

## 2019-11-12 DIAGNOSIS — Z170 Estrogen receptor positive status [ER+]: Secondary | ICD-10-CM

## 2019-11-12 MED ORDER — ANASTROZOLE 1 MG PO TABS: 1.0000 mg | ORAL_TABLET | Freq: Every day | ORAL | 3 refills | Status: DC

## 2019-11-12 NOTE — Assessment & Plan Note (Signed)
Left Mastectomy:10/31/2017: IDC grade 2 1.6 cm 0/3 lymph nodes negative, ER PR positive HER-2 negative with Ki-67 of 30%, T1 cN0 stage I a  Recommendation: 1.Letrozole 2.5 mg daily times 5 yearsstarted February 2019, switched to anastrozole 04/27/2018 2.Osteoporosis: Bone density done recently showed a T score of -2.7 suggestive of osteoporosis.  Currently on Fosamax with calcium and vitamin D  Anastrozole toxicities:  No major adverse effects to anastrozole therapy.  Breast Cancer Surveillance: 1. Breast Exam: 04/27/18 2. Mammogram: 10/12/2018: Benign breast density category B Ultrasound left axilla 04/15/2019: No evidence of malignancy  Return to clinic in 1 year for follow-up

## 2019-11-16 ENCOUNTER — Ambulatory Visit
Admission: RE | Admit: 2019-11-16 | Discharge: 2019-11-16 | Disposition: A | Discharge status: Home / Self Care | Payer: PPO | Source: Ambulatory Visit | Attending: Hematology and Oncology | Admitting: Hematology and Oncology

## 2019-11-16 ENCOUNTER — Other Ambulatory Visit: Payer: Self-pay

## 2019-11-16 DIAGNOSIS — C50212 Malignant neoplasm of upper-inner quadrant of left female breast: Secondary | ICD-10-CM

## 2019-11-16 DIAGNOSIS — Z170 Estrogen receptor positive status [ER+]: Secondary | ICD-10-CM

## 2019-11-16 DIAGNOSIS — Z1231 Encounter for screening mammogram for malignant neoplasm of breast: Secondary | ICD-10-CM | POA: Diagnosis not present

## 2019-11-19 ENCOUNTER — Ambulatory Visit: Payer: PPO | Attending: Internal Medicine

## 2019-11-19 DIAGNOSIS — Z23 Encounter for immunization: Secondary | ICD-10-CM | POA: Insufficient documentation

## 2019-11-19 NOTE — Progress Notes (Signed)
   Covid-19 Vaccination Clinic  Name:  MIKERA SCHWEGEL    MRN: [id number] DOB: May 20, 1935  11/19/2019  Ms. Charter was observed post Covid-19 immunization for 15 minutes without incidence. She was provided with Vaccine Information Sheet and instruction to access the V-Safe system.   Ms. Cobbler was instructed to call 911 with any severe reactions post vaccine: Marland Kitchen Difficulty breathing  . Swelling of your face and throat  . A fast heartbeat  . A bad rash all over your body  . Dizziness and weakness    Immunizations Administered    Name Date Dose VIS Date Route   Pfizer COVID-19 Vaccine 11/19/2019  1:29 PM 0.3 mL 09/03/2019 Intramuscular   Manufacturer: [place]   Lot: [id number]   [place]: [id number]

## 2019-12-14 ENCOUNTER — Ambulatory Visit: Payer: Medicare Other | Attending: Internal Medicine

## 2019-12-14 DIAGNOSIS — Z23 Encounter for immunization: Secondary | ICD-10-CM

## 2019-12-14 NOTE — Progress Notes (Signed)
   Covid-19 Vaccination Clinic  Name:  Jamie Beltran    MRN: [id number] DOB: 1935/01/26  12/14/2019  [name] was observed post Covid-19 immunization for 30 minutes based on pre-vaccination screening without incident. She was provided with Vaccine Information Sheet and instruction to access the V-Safe system.   [name] was instructed to call 911 with any severe reactions post vaccine: Marland Kitchen Difficulty breathing  . Swelling of face and throat  . A fast heartbeat  . A bad rash all over body  . Dizziness and weakness   Immunizations Administered    Name Date Dose VIS Date Route   Pfizer COVID-19 Vaccine 12/14/2019  4:24 PM 0.3 mL 09/03/2019 Intramuscular   Manufacturer: [place]   Lot: [id number]   [place]: [id number]

## 2020-01-27 ENCOUNTER — Other Ambulatory Visit: Payer: Self-pay

## 2020-01-27 MED ORDER — ANASTROZOLE 1 MG PO TABS: 1.0000 mg | ORAL_TABLET | Freq: Every day | ORAL | 0 refills | Status: DC

## 2020-03-29 ENCOUNTER — Other Ambulatory Visit: Payer: Self-pay | Admitting: Hematology and Oncology

## 2020-04-25 ENCOUNTER — Other Ambulatory Visit: Payer: Self-pay

## 2020-04-25 ENCOUNTER — Other Ambulatory Visit (HOSPITAL_COMMUNITY)
Admission: RE | Admit: 2020-04-25 | Discharge: 2020-04-25 | Disposition: A | Discharge status: Home / Self Care | Payer: Medicare Other | Source: Ambulatory Visit | Attending: Surgery | Admitting: Surgery

## 2020-04-25 DIAGNOSIS — Z01812 Encounter for preprocedural laboratory examination: Secondary | ICD-10-CM | POA: Insufficient documentation

## 2020-04-25 DIAGNOSIS — Z20822 Contact with and (suspected) exposure to covid-19: Secondary | ICD-10-CM | POA: Insufficient documentation

## 2020-04-26 LAB — SARS CORONAVIRUS 2 (TAT 6-24 HRS): SARS Coronavirus 2: NEGATIVE

## 2020-05-01 ENCOUNTER — Ambulatory Visit: Payer: Self-pay | Admitting: Surgery

## 2020-05-08 ENCOUNTER — Other Ambulatory Visit (HOSPITAL_COMMUNITY)
Admission: RE | Admit: 2020-05-08 | Discharge: 2020-05-08 | Disposition: A | Discharge status: Home / Self Care | Payer: Medicare Other | Source: Ambulatory Visit | Attending: Surgery | Admitting: Surgery

## 2020-05-08 DIAGNOSIS — Z01812 Encounter for preprocedural laboratory examination: Secondary | ICD-10-CM | POA: Diagnosis present

## 2020-05-08 DIAGNOSIS — Z20822 Contact with and (suspected) exposure to covid-19: Secondary | ICD-10-CM | POA: Diagnosis not present

## 2020-05-08 LAB — SARS CORONAVIRUS 2 (TAT 6-24 HRS): SARS Coronavirus 2: NEGATIVE

## 2020-05-10 NOTE — Anesthesia Preprocedure Evaluation (Addendum)
Anesthesia Evaluation  Patient identified by MRN, date of birth, ID band Patient awake    Reviewed: Allergy & Precautions, NPO status , Patient's Chart, lab work & pertinent test results  Airway Mallampati: II  TM Distance: >3 FB Neck ROM: Full    Dental no notable dental hx. (+) Teeth Intact, Dental Advisory Given   Pulmonary former smoker,    Pulmonary exam normal breath sounds clear to auscultation       Cardiovascular Exercise Tolerance: Good Normal cardiovascular exam Rhythm:Regular Rate:Normal     Neuro/Psych  Headaches, negative psych ROS   GI/Hepatic Neg liver ROS, hiatal hernia, GERD  ,  Endo/Other  Hypothyroidism   Renal/GU negative Renal ROS     Musculoskeletal  (+) Arthritis ,   Abdominal   Peds  Hematology negative hematology ROS (+)   Anesthesia Other Findings   Reproductive/Obstetrics                            Anesthesia Physical Anesthesia Plan  ASA: III  Anesthesia Plan: MAC   Post-op Pain Management:    Induction:   PONV Risk Score and Plan: Treatment may vary due to age or medical condition  Airway Management Planned: Natural Airway  Additional Equipment: None  Intra-op Plan:   Post-operative Plan:   Informed Consent: I have reviewed the patients History and Physical, chart, labs and discussed the procedure including the risks, benefits and alternatives for the proposed anesthesia with the patient or authorized representative who has indicated his/her understanding and acceptance.     Dental advisory given  Plan Discussed with: CRNA  Anesthesia Plan Comments: (Colonoscopy for rectal bleeding under MAc)       Anesthesia Quick Evaluation

## 2020-05-11 ENCOUNTER — Ambulatory Visit (HOSPITAL_COMMUNITY): Payer: Medicare Other | Admitting: Anesthesiology

## 2020-05-11 ENCOUNTER — Other Ambulatory Visit: Payer: Self-pay

## 2020-05-11 ENCOUNTER — Encounter (HOSPITAL_COMMUNITY): Payer: Self-pay | Admitting: Surgery

## 2020-05-11 ENCOUNTER — Encounter (HOSPITAL_COMMUNITY)
Admission: RE | Disposition: A | Discharge status: Home / Self Care | Payer: Self-pay | Source: Home / Self Care | Attending: Surgery

## 2020-05-11 ENCOUNTER — Ambulatory Visit (HOSPITAL_COMMUNITY)
Admission: RE | Admit: 2020-05-11 | Discharge: 2020-05-11 | Disposition: A | Discharge status: Home / Self Care | Payer: Medicare Other | Attending: Surgery | Admitting: Surgery

## 2020-05-11 DIAGNOSIS — Z87891 Personal history of nicotine dependence: Secondary | ICD-10-CM | POA: Insufficient documentation

## 2020-05-11 DIAGNOSIS — Z85828 Personal history of other malignant neoplasm of skin: Secondary | ICD-10-CM | POA: Insufficient documentation

## 2020-05-11 DIAGNOSIS — Z880 Allergy status to penicillin: Secondary | ICD-10-CM | POA: Diagnosis not present

## 2020-05-11 DIAGNOSIS — K5730 Diverticulosis of large intestine without perforation or abscess without bleeding: Secondary | ICD-10-CM | POA: Insufficient documentation

## 2020-05-11 DIAGNOSIS — Z885 Allergy status to narcotic agent status: Secondary | ICD-10-CM | POA: Diagnosis not present

## 2020-05-11 DIAGNOSIS — Z888 Allergy status to other drugs, medicaments and biological substances status: Secondary | ICD-10-CM | POA: Insufficient documentation

## 2020-05-11 DIAGNOSIS — M170 Bilateral primary osteoarthritis of knee: Secondary | ICD-10-CM | POA: Diagnosis not present

## 2020-05-11 DIAGNOSIS — M160 Bilateral primary osteoarthritis of hip: Secondary | ICD-10-CM | POA: Diagnosis not present

## 2020-05-11 DIAGNOSIS — M479 Spondylosis, unspecified: Secondary | ICD-10-CM | POA: Diagnosis not present

## 2020-05-11 DIAGNOSIS — K625 Hemorrhage of anus and rectum: Secondary | ICD-10-CM | POA: Insufficient documentation

## 2020-05-11 DIAGNOSIS — K640 First degree hemorrhoids: Secondary | ICD-10-CM | POA: Insufficient documentation

## 2020-05-11 HISTORY — PX: COLONOSCOPY: SHX5424

## 2020-05-11 SURGERY — COLONOSCOPY
Anesthesia: Monitor Anesthesia Care

## 2020-05-11 MED ORDER — LABETALOL HCL 5 MG/ML IV SOLN
INTRAVENOUS | Status: AC
  Filled 2020-05-11: qty 4

## 2020-05-11 MED ORDER — PROPOFOL 500 MG/50ML IV EMUL
INTRAVENOUS | Status: AC
  Filled 2020-05-11: qty 50

## 2020-05-11 MED ORDER — LACTATED RINGERS IV SOLN: INTRAVENOUS | Status: DC | PRN

## 2020-05-11 MED ORDER — LABETALOL HCL 5 MG/ML IV SOLN
5.0000 mg | Freq: Once | INTRAVENOUS | Status: AC
  Administered 2020-05-11: 5 mg via INTRAVENOUS

## 2020-05-11 MED ORDER — PROPOFOL 10 MG/ML IV BOLUS
INTRAVENOUS | Status: AC
  Filled 2020-05-11: qty 20

## 2020-05-11 MED ORDER — PROPOFOL 10 MG/ML IV BOLUS
INTRAVENOUS | Status: DC | PRN
  Administered 2020-05-11 (×3): 20 mg via INTRAVENOUS

## 2020-05-11 MED ORDER — PROPOFOL 500 MG/50ML IV EMUL
INTRAVENOUS | Status: DC | PRN
  Administered 2020-05-11: 100 ug/kg/min via INTRAVENOUS

## 2020-05-11 MED ORDER — LIDOCAINE 2% (20 MG/ML) 5 ML SYRINGE
INTRAMUSCULAR | Status: DC | PRN
  Administered 2020-05-11: 80 mg via INTRAVENOUS

## 2020-05-11 NOTE — Discharge Instructions (Signed)
YOU HAD AN ENDOSCOPIC PROCEDURE TODAY: Refer to the procedure report and other information in the discharge instructions given to you for any specific questions about what was found during the examination. If this information does not answer your questions, please call Dr. Craig Guess office at [phone] to clarify.   YOU SHOULD EXPECT: Some feelings of bloating in the abdomen. Passage of more gas than usual. Walking can help get rid of the air that was put into your GI tract during the procedure and reduce the bloating. If you had a lower endoscopy (such as a colonoscopy or flexible sigmoidoscopy) you may notice spotting of blood in your stool or on the toilet paper. Some abdominal soreness may be present for a day or two, also.  DIET: Your first meal following the procedure should be a light meal and then it is ok to progress to your normal diet. A half-sandwich or bowl of soup is an example of a good first meal. Heavy or fried foods are harder to digest and may make you feel nauseous or bloated. Drink plenty of fluids but you should avoid alcoholic beverages for 24 hours. If you had a esophageal dilation, please see attached instructions for diet.    ACTIVITY: Your care partner should take you home directly after the procedure. You should plan to take it easy, moving slowly for the rest of the day. You can resume normal activity the day after the procedure however YOU SHOULD NOT DRIVE, use power tools, machinery or perform tasks that involve climbing or major physical exertion for 24 hours (because of the sedation medicines used during the test).   SYMPTOMS TO REPORT IMMEDIATELY: A gastroenterologist can be reached at any hour. Please call [phone]  for any of the following symptoms:   Following lower endoscopy (colonoscopy, flexible sigmoidoscopy) Excessive amounts of blood in the stool  Significant tenderness, worsening of abdominal pains  Swelling of the abdomen that is new, acute  Fever of  100 or higher   FOLLOW UP:  If any biopsies were taken you will be contacted by phone or by letter within the next 1-3 weeks. Call [phone]  if you have not heard about the biopsies in 3 weeks.  Please also call with any specific questions about appointments or follow up tests.

## 2020-05-11 NOTE — Progress Notes (Signed)
BP elevated. Pt c/o 4/10 HA that started when BP started going up. Denies chest pain or dizziness. No change in vision. No n/v. Dr. Valma Cava made aware and is at bedside to eval. VO obt for IV Labetalol.

## 2020-05-11 NOTE — Anesthesia Postprocedure Evaluation (Signed)
Anesthesia Post Note  Patient: ANNDREA MIHELICH  Procedure(s) Performed: DIAGNOSTIC COLONOSCOPY (N/A )     Patient location during evaluation: PACU Anesthesia Type: MAC Level of consciousness: awake and alert Pain management: pain level controlled Vital Signs Assessment: post-procedure vital signs reviewed and stable Respiratory status: spontaneous breathing, nonlabored ventilation, respiratory function stable and patient connected to nasal cannula oxygen Cardiovascular status: stable and blood pressure returned to baseline Postop Assessment: no apparent nausea or vomiting Anesthetic complications: no   No complications documented.  Last Vitals:  Vitals:   05/11/20 1100 05/11/20 1110  BP: (!) 156/73 (!) 159/60  Pulse: 71 72  Resp: 16 16  Temp:    SpO2: 94% 94%    Last Pain:  Vitals:   05/11/20 1110  TempSrc:   PainSc: 2                  Barnet Glasgow

## 2020-05-11 NOTE — Op Note (Signed)
   Operative Note   Date: 05/11/2020   Procedure: diagnostic colonoscopy  Pre-op diagnosis: rectal bleeding Post-op diagnosis: diverticulosis, grade 1 internal hemorrhoids  Indication and clinical history: The patient is a 85 y.o. year old female with recurrent, intermittent rectal bleeding     Surgeon: Jesusita Oka, MD  Anesthesiologist: Valma Cava, MD Anesthesia: MAC  Findings:  . Specimen: none . EBL: 0cc . Drains/Implants: none  Disposition: PACU - hemodynamically stable.  Description of procedure: The patient was positioned left lateral decubitus after uneventful MAC anesthetic induction. Time-out was performed verifying correct patient, procedure, and signature of informed consent.   Visual inspection of the anus and digital rectal exam were performed and were unremarkable. A lubricated colonoscope was inserted into the anal canal and advanced to the cecum, identified by crow's foot and ileocecal valve. The colon was inspected circumferentially and no masses or lesions were identified. Diverticulosis was noted in the sigmoid colon without evidence of bleeding. Retroflexion was performed and was notable for grade 1 internal hemorrhoids. Duration of inspection of the colonic mucosa was greater than 10 minutes. The mucosa of the colon appeared healthy. The colonoscope was withdrawn from the anal canal and the patient was awakened from anesthesia. The patient was transported to the PACU in good condition. There were no complications.    Jesusita Oka, MD General and [facility]

## 2020-05-11 NOTE — Progress Notes (Signed)
Call placed to Dr. Valma Cava for update of vitals/BP. MD sts he is fine to have pt discharged to home at this time. No further orders given. Pt sts she feels fine to be discharged. Sts HA improving. Denies chest pain, SHOB, n/v, dizziness or change in vision. A/Ox4. Skin w/d/pink. Resp wnl, equal and non-labored.

## 2020-05-11 NOTE — Transfer of Care (Signed)
Immediate Anesthesia Transfer of Care Note  Patient: Jamie Beltran  Procedure(s) Performed: DIAGNOSTIC COLONOSCOPY (N/A )  Patient Location: Endoscopy Unit  Anesthesia Type:MAC  Level of Consciousness: awake and alert   Airway & Oxygen Therapy: Patient Spontanous Breathing and Patient connected to face mask oxygen  Post-op Assessment: Report given to RN and Post -op Vital signs reviewed and stable  Post vital signs: Reviewed and stable  Last Vitals:  Vitals Value Taken Time  BP    Temp    Pulse    Resp    SpO2      Last Pain:  Vitals:   05/11/20 0839  TempSrc: Oral  PainSc: 0-No pain         Complications: No complications documented.

## 2020-05-11 NOTE — Progress Notes (Signed)
Med a/o. Pt remains A/Ox4. Skin w/d/pink. Denies chest pain, SHOB, n/v, or dizziness. Cont to monitor.

## 2020-05-11 NOTE — Anesthesia Procedure Notes (Signed)
Date/Time: 05/11/2020 8:49 AM Performed by: Sharlette Dense, CRNA Oxygen Delivery Method: Simple face mask

## 2020-05-11 NOTE — H&P (Addendum)
Reason for Consult/Chief Complaint: rectal bleeding  Jamie Beltran is an 85 y.o. female.   HPI: 64F with recurrent, intermittent rectal bleeding, no source of bleeding identified on anoscopy in office. No bleeding since last office visit, except for a small amount on toilet paper this morning.  Past Medical History:  Diagnosis Date  . Basal cell carcinoma   . Blood in stool   . Breast cancer ([place]) 09/2017   left breast  . Chronic back pain   . Colonic polyp   . Diverticulitis   . Diverticulosis   . Esophageal reflux   . Fibrocystic breast disease   . GERD (gastroesophageal reflux disease) 05/29/2016  . Hiatal hernia   . Hyperlipidemia 05/29/2016  . Hypothyroidism 05/29/2016  . Migraine 05/29/2016  . Migraine headache   . OA (osteoarthritis)    back, hips, knees  . Other and unspecified hyperlipidemia     Past Surgical History:  Procedure Laterality Date  . ABDOMINAL HYSTERECTOMY    . BLADDER SUSPENSION  1986  . CARDIAC CATHETERIZATION N/A 05/29/2016   Procedure: Left Heart Cath and Coronary Angiography;  Surgeon: Sherren Mocha, MD;  Location: [facility];  Service: Cardiovascular;  Laterality: N/A;  . CATARACT EXTRACTION, BILATERAL    . CHOLECYSTECTOMY  2002  . COLONOSCOPY  04/08  . ESOPHAGOGASTRODUODENOSCOPY  9/06, 4/08  . FOOT SURGERY  1990  . HEEL SPUR EXCISION  2001  . KNEE ARTHROSCOPY Bilateral 2006, 2013  . MASTECTOMY Left 09/23/2017  . TOTAL ABDOMINAL HYSTERECTOMY W/ BILATERAL SALPINGOOPHORECTOMY  1981  . TOTAL MASTECTOMY Left 10/31/2017   Procedure: LEFT TOTAL MASTECTOMY;  Surgeon: Excell Seltzer, MD;  Location: [facility];  Service: General;  Laterality: Left;  Marland Kitchen VEIN LIGATION Left 1980    Family History  Problem Relation Age of Onset  . Hypertension Father   . Heart attack Mother   . Kidney Stones Sister        kidney problems  . Kidney Stones Sister        kidney transplant  . Breast cancer Maternal Aunt   . Lung cancer  Maternal Aunt     Social History:  reports that she quit smoking about 39 years ago. Her smoking use included cigarettes. She has a 20.00 pack-year smoking history. She has never used smokeless tobacco. She reports current alcohol use. She reports that she does not use drugs.  Allergies:  Allergies  Allergen Reactions  . Codeine Other (See Comments)    "Makes me crazy."  . Penicillins Hives and Swelling    Has patient had a PCN reaction causing immediate rash, facial/tongue/throat swelling, SOB or lightheadedness with hypotension: Yes Has patient had a PCN reaction causing severe rash involving mucus membranes or skin necrosis: No Has patient had a PCN reaction that required hospitalization No Has patient had a PCN reaction occurring within the last 10 years: No If all of the above answers are "NO", then may proceed with Cephalosporin use.   . Adhesive [Tape] Itching    Heart monitor pads  . Protonix [Pantoprazole Sodium] Diarrhea    With 40 mg twice daily, can tolerate 40 mg once daily      Medications: I have reviewed the patient's current medications.  No results found for this or any previous visit (from the past 48 hour(s)).  No results found.  ROS 10 point review of systems is negative except as listed above in HPI.   Physical Exam There were no vitals taken  for this visit. Constitutional: well-developed, well-nourished HEENT: pupils equal, round, reactive to light, 3mm b/l, moist conjunctiva, external inspection of ears and nose normal, hearing intact Oropharynx: normal oropharyngeal mucosa, normal dentition Neck: no thyromegaly, trachea midline, no midline cervical tenderness to palpation Chest: breath sounds equal bilaterally, normal respiratory effort, no midline or lateral chest wall tenderness to palpation/deformity Abdomen: soft, NT, no bruising, no hepatosplenomegaly GU: normal female genitalia  Back: no wounds, no thoracic/lumbar spine tenderness to  palpation, no thoracic/lumbar spine stepoffs Rectal: deferred Extremities: 2+ radial and pedal pulses bilaterally, motor and sensation intact to bilateral UE and LE, no peripheral edema MSK: normal gait/station, no clubbing/cyanosis of fingers/toes, normal ROM of all four extremities Skin: warm, dry, no rashes Psych: normal memory, normal mood/affect    Assessment/Plan: 72F with rectal bleeding. Diagnostic colonoscopy planned for today. Informed consent obtained via phone with the patient and her son. Detailed discussion of risks, including perforation, bleeding. All questions answered to their satisfaction.    Jesusita Oka, MD General and [facility]

## 2020-05-12 ENCOUNTER — Encounter (HOSPITAL_COMMUNITY): Payer: Self-pay | Admitting: Surgery

## 2020-07-08 ENCOUNTER — Ambulatory Visit: Payer: Medicare Other

## 2020-10-25 ENCOUNTER — Other Ambulatory Visit: Payer: Self-pay | Admitting: Hematology and Oncology

## 2020-10-25 DIAGNOSIS — Z9012 Acquired absence of left breast and nipple: Secondary | ICD-10-CM

## 2020-10-25 DIAGNOSIS — Z853 Personal history of malignant neoplasm of breast: Secondary | ICD-10-CM

## 2020-10-25 DIAGNOSIS — Z1231 Encounter for screening mammogram for malignant neoplasm of breast: Secondary | ICD-10-CM

## 2020-12-12 ENCOUNTER — Ambulatory Visit
Admission: RE | Admit: 2020-12-12 | Discharge: 2020-12-12 | Disposition: A | Discharge status: Home / Self Care | Payer: PPO | Source: Ambulatory Visit | Attending: Hematology and Oncology | Admitting: Hematology and Oncology

## 2020-12-12 ENCOUNTER — Other Ambulatory Visit: Payer: Self-pay

## 2020-12-12 DIAGNOSIS — Z1231 Encounter for screening mammogram for malignant neoplasm of breast: Secondary | ICD-10-CM

## 2020-12-12 DIAGNOSIS — Z853 Personal history of malignant neoplasm of breast: Secondary | ICD-10-CM

## 2020-12-12 DIAGNOSIS — Z9012 Acquired absence of left breast and nipple: Secondary | ICD-10-CM

## 2020-12-21 DIAGNOSIS — M5416 Radiculopathy, lumbar region: Secondary | ICD-10-CM | POA: Diagnosis not present

## 2021-01-10 DIAGNOSIS — E039 Hypothyroidism, unspecified: Secondary | ICD-10-CM | POA: Diagnosis not present

## 2021-01-10 DIAGNOSIS — R252 Cramp and spasm: Secondary | ICD-10-CM | POA: Diagnosis not present

## 2021-01-10 DIAGNOSIS — G8929 Other chronic pain: Secondary | ICD-10-CM | POA: Diagnosis not present

## 2021-01-10 DIAGNOSIS — F320 Major depressive disorder, single episode, mild: Secondary | ICD-10-CM | POA: Diagnosis not present

## 2021-01-10 DIAGNOSIS — E7800 Pure hypercholesterolemia, unspecified: Secondary | ICD-10-CM | POA: Diagnosis not present

## 2021-01-10 DIAGNOSIS — E559 Vitamin D deficiency, unspecified: Secondary | ICD-10-CM | POA: Diagnosis not present

## 2021-01-10 DIAGNOSIS — C50912 Malignant neoplasm of unspecified site of left female breast: Secondary | ICD-10-CM | POA: Diagnosis not present

## 2021-01-18 DIAGNOSIS — M5416 Radiculopathy, lumbar region: Secondary | ICD-10-CM | POA: Diagnosis not present

## 2021-01-23 DIAGNOSIS — M48062 Spinal stenosis, lumbar region with neurogenic claudication: Secondary | ICD-10-CM | POA: Diagnosis not present

## 2021-02-20 DIAGNOSIS — E7800 Pure hypercholesterolemia, unspecified: Secondary | ICD-10-CM | POA: Diagnosis not present

## 2021-02-20 DIAGNOSIS — E039 Hypothyroidism, unspecified: Secondary | ICD-10-CM | POA: Diagnosis not present

## 2021-02-20 DIAGNOSIS — F320 Major depressive disorder, single episode, mild: Secondary | ICD-10-CM | POA: Diagnosis not present

## 2021-02-20 DIAGNOSIS — E785 Hyperlipidemia, unspecified: Secondary | ICD-10-CM | POA: Diagnosis not present

## 2021-02-20 DIAGNOSIS — F4321 Adjustment disorder with depressed mood: Secondary | ICD-10-CM | POA: Diagnosis not present

## 2021-02-20 DIAGNOSIS — G8929 Other chronic pain: Secondary | ICD-10-CM | POA: Diagnosis not present

## 2021-02-20 DIAGNOSIS — K219 Gastro-esophageal reflux disease without esophagitis: Secondary | ICD-10-CM | POA: Diagnosis not present

## 2021-02-23 DIAGNOSIS — M545 Low back pain, unspecified: Secondary | ICD-10-CM | POA: Diagnosis not present

## 2021-03-02 DIAGNOSIS — E039 Hypothyroidism, unspecified: Secondary | ICD-10-CM | POA: Diagnosis not present

## 2021-03-02 DIAGNOSIS — E785 Hyperlipidemia, unspecified: Secondary | ICD-10-CM | POA: Diagnosis not present

## 2021-03-02 DIAGNOSIS — F320 Major depressive disorder, single episode, mild: Secondary | ICD-10-CM | POA: Diagnosis not present

## 2021-03-02 DIAGNOSIS — G8929 Other chronic pain: Secondary | ICD-10-CM | POA: Diagnosis not present

## 2021-03-02 DIAGNOSIS — K219 Gastro-esophageal reflux disease without esophagitis: Secondary | ICD-10-CM | POA: Diagnosis not present

## 2021-03-02 DIAGNOSIS — C50912 Malignant neoplasm of unspecified site of left female breast: Secondary | ICD-10-CM | POA: Diagnosis not present

## 2021-03-02 DIAGNOSIS — E7800 Pure hypercholesterolemia, unspecified: Secondary | ICD-10-CM | POA: Diagnosis not present

## 2021-03-02 DIAGNOSIS — M170 Bilateral primary osteoarthritis of knee: Secondary | ICD-10-CM | POA: Diagnosis not present

## 2021-03-02 DIAGNOSIS — F4321 Adjustment disorder with depressed mood: Secondary | ICD-10-CM | POA: Diagnosis not present

## 2021-03-05 DIAGNOSIS — M48061 Spinal stenosis, lumbar region without neurogenic claudication: Secondary | ICD-10-CM | POA: Diagnosis not present

## 2021-03-05 DIAGNOSIS — M4310 Spondylolisthesis, site unspecified: Secondary | ICD-10-CM | POA: Diagnosis not present

## 2021-03-05 DIAGNOSIS — M810 Age-related osteoporosis without current pathological fracture: Secondary | ICD-10-CM | POA: Diagnosis not present

## 2021-03-15 ENCOUNTER — Ambulatory Visit: Payer: Self-pay | Admitting: Orthopedic Surgery

## 2021-03-22 DIAGNOSIS — R0981 Nasal congestion: Secondary | ICD-10-CM | POA: Diagnosis not present

## 2021-03-22 DIAGNOSIS — R519 Headache, unspecified: Secondary | ICD-10-CM | POA: Diagnosis not present

## 2021-03-22 DIAGNOSIS — R509 Fever, unspecified: Secondary | ICD-10-CM | POA: Diagnosis not present

## 2021-03-22 DIAGNOSIS — U071 COVID-19: Secondary | ICD-10-CM | POA: Diagnosis not present

## 2021-03-24 DIAGNOSIS — U071 COVID-19: Secondary | ICD-10-CM

## 2021-03-24 HISTORY — DX: COVID-19: U07.1

## 2021-04-02 NOTE — Progress Notes (Signed)
Surgical Instructions    Your procedure is scheduled on 04/26/2021.  Report to [facility] Main Entrance "A" at 05:30 A.M., then check in with the Admitting office.  Call this number if you have problems the morning of surgery:  [phone]   If you have any questions prior to your surgery date call [phone]: Open Monday-Friday 8am-4pm    Remember:  Do not eat after midnight the night before your surgery  You may drink clear liquids until 04:30am the morning of your surgery.   Clear liquids allowed are: Water, Non-Citrus Juices (without pulp), Carbonated Beverages, Clear Tea, Black Coffee Only, and Gatorade  Patient Instructions  The night before surgery:  No food after midnight. ONLY clear liquids after midnight  The day of surgery (if you do NOT have diabetes):  Drink ONE (1) Pre-Surgery Clear Ensure by 4:30am the morning of surgery. Drink in one sitting. Do not sip.  This drink was given to you during your hospital  pre-op appointment visit. Nothing else to drink after completing the  Pre-Surgery Clear Ensure.         If you have questions, please contact your surgeon's office.     Take these medicines the morning of surgery with A SIP OF WATER  anastrozole (ARIMIDEX) cycloSPORINE (RESTASIS) eye drops escitalopram (LEXAPRO) gabapentin (NEURONTIN) HYDROcodone-acetaminophen (NORCO) if needed levothyroxine (SYNTHROID) omeprazole (PRILOSEC)   As of today, STOP taking any Aspirin (unless otherwise instructed by your surgeon) Aleve, Naproxen, Ibuprofen, Motrin, Advil, Goody's, BC's, all herbal medications, fish oil, diclofenac sodium (VOLTAREN) gel and all vitamins.          Do not wear jewelry or makeup Do not wear lotions, powders, perfumes or deodorant. Do not shave 48 hours prior to surgery.   Do not bring valuables to the hospital.  DO Not wear nail polish, gel polish, artificial nails, or any other type of covering on natural nails  including finger and  toenails. If patients have artificial nails, gel coating, etc. that need to be removed by a nail salon please have this removed prior to surgery or surgery may need to be canceled/delayed if the surgeon/ anesthesia feels like the patient is unable to be adequately monitored.             [facility] is not responsible for any belongings or valuables.  Do NOT Smoke (Tobacco/Vaping) or drink Alcohol 24 hours prior to your procedure If you use a CPAP at night, you may bring all equipment for your overnight stay.   Contacts, glasses, dentures or bridgework may not be worn into surgery, please bring cases for these belongings   For patients admitted to the hospital, discharge time will be determined by your treatment team.   Patients discharged the day of surgery will not be allowed to drive home, and someone needs to stay with them for 24 hours.  ONLY 1 SUPPORT PERSON MAY BE PRESENT WHILE YOU ARE IN SURGERY. IF YOU ARE TO BE ADMITTED ONCE YOU ARE IN YOUR ROOM YOU WILL BE ALLOWED TWO (2) VISITORS.  Minor children may have two parents present. Special consideration for safety and communication needs will be reviewed on a case by case basis.  Special instructions:    Oral Hygiene is also important to reduce your risk of infection.  Remember - BRUSH YOUR TEETH THE MORNING OF SURGERY WITH YOUR REGULAR TOOTHPASTE   [place]- Preparing For Surgery  Before surgery, you can play an important role. Because skin is not sterile, your  skin needs to be as free of germs as possible. You can reduce the number of germs on your skin by washing with CHG (chlorahexidine gluconate) Soap before surgery.  CHG is an antiseptic cleaner which kills germs and bonds with the skin to continue killing germs even after washing.     Please do not use if you have an allergy to CHG or antibacterial soaps. If your skin becomes reddened/irritated stop using the CHG.  Do not shave (including legs and underarms) for at least 48  hours prior to first CHG shower. It is OK to shave your face.  Please follow these instructions carefully.     Shower the NIGHT BEFORE SURGERY and the MORNING OF SURGERY with CHG Soap.   If you chose to wash your hair, wash your hair first as usual with your normal shampoo. After you shampoo, rinse your hair and body thoroughly to remove the shampoo.  Then [employer] and genitals (private parts) with your normal soap and rinse thoroughly to remove soap.  After that Use CHG Soap as you would any other liquid soap. You can apply CHG directly to the skin and wash gently with a scrungie or a clean washcloth.   Apply the CHG Soap to your body ONLY FROM THE NECK DOWN.  Do not use on open wounds or open sores. Avoid contact with your eyes, ears, mouth and genitals (private parts). Wash Face and genitals (private parts)  with your normal soap.   Wash thoroughly, paying special attention to the area where your surgery will be performed.  Thoroughly rinse your body with warm water from the neck down.  DO NOT shower/wash with your normal soap after using and rinsing off the CHG Soap.  Pat yourself dry with a CLEAN TOWEL.  Wear CLEAN PAJAMAS to bed the night before surgery  Place CLEAN SHEETS on your bed the night before your surgery  DO NOT SLEEP WITH PETS.   Day of Surgery: Take a shower with CHG soap. Wear Clean/Comfortable clothing the morning of surgery Do not apply any deodorants/lotions.   Remember to brush your teeth WITH YOUR REGULAR TOOTHPASTE.   Please read over the following fact sheets that you were given.

## 2021-04-03 ENCOUNTER — Inpatient Hospital Stay (HOSPITAL_COMMUNITY)
Admission: RE | Admit: 2021-04-03 | Discharge: 2021-04-03 | Disposition: A | Discharge status: Home / Self Care | Payer: PPO | Source: Ambulatory Visit

## 2021-04-03 NOTE — Progress Notes (Signed)
Patient did not arrive for her 1 pm PAT appointment. Called patient and she stated that she has Covid and her appointment has been rescheduled for Wednesday July 27 at 10am. Will confirm with [facility].

## 2021-04-17 NOTE — Progress Notes (Signed)
Surgical Instructions    Your procedure is scheduled on Thursday, August 4th, 2022 .  Report to [facility] Main Entrance "A" at 05:30 A.M., then check in with the Admitting office.  Call this number if you have problems the morning of surgery:  [phone]   If you have any questions prior to your surgery date call [phone]: Open Monday-Friday 8am-4pm    Remember:  Do not eat after midnight the night before your surgery  You may drink clear liquids until 04:30 the morning of your surgery.   Clear liquids allowed are: Water, Non-Citrus Juices (without pulp), Carbonated Beverages, Clear Tea, Black Coffee Only, and Gatorade  Patient Instructions  The night before surgery:  No food after midnight. ONLY clear liquids after midnight  The day of surgery (if you do NOT have diabetes):  Drink ONE (1) Pre-Surgery Clear Ensure by 04:30 the morning of surgery. Drink in one sitting. Do not sip.  This drink was given to you during your hospital  pre-op appointment visit.  Nothing else to drink after completing the  Pre-Surgery Clear Ensure.    Take these medicines the morning of surgery with A SIP OF WATER:  anastrozole (ARIMIDEX) cycloSPORINE (RESTASIS) eye drops escitalopram (LEXAPRO) gabapentin (NEURONTIN) HYDROcodone-acetaminophen (NORCO) if needed levothyroxine (SYNTHROID) omeprazole (PRILOSEC  As of today, STOP taking any Aspirin (unless otherwise instructed by your surgeon) Aleve, Naproxen, Ibuprofen, Motrin, Advil, Goody's, BC's, all herbal medications, fish oil, and all vitamins.          Do not wear jewelry or makeup Do not wear lotions, powders, perfumes, or deodorant. Do not shave 48 hours prior to surgery.   Do not bring valuables to the hospital. DO Not wear nail polish, gel polish, artificial nails, or any other type of covering on natural nails including finger and toenails. If patients have artificial nails, gel coating, etc. that need to be removed by a nail  salon please have this removed prior to surgery or surgery may need to be canceled/delayed if the surgeon/ anesthesia feels like the patient is unable to be adequately monitored.             [facility] is not responsible for any belongings or valuables.  Do NOT Smoke (Tobacco/Vaping) or drink Alcohol 24 hours prior to your procedure If you use a CPAP at night, you may bring all equipment for your overnight stay.   Contacts, glasses, dentures or bridgework may not be worn into surgery, please bring cases for these belongings   For patients admitted to the hospital, discharge time will be determined by your treatment team.   Patients discharged the day of surgery will not be allowed to drive home, and someone needs to stay with them for 24 hours.  ONLY 1 SUPPORT PERSON MAY BE PRESENT WHILE YOU ARE IN SURGERY. IF YOU ARE TO BE ADMITTED ONCE YOU ARE IN YOUR ROOM YOU WILL BE ALLOWED TWO (2) VISITORS.  Minor children may have two parents present. Special consideration for safety and communication needs will be reviewed on a case by case basis.  Special instructions:    Oral Hygiene is also important to reduce your risk of infection.  Remember - BRUSH YOUR TEETH THE MORNING OF SURGERY WITH YOUR REGULAR TOOTHPASTE   [facility]- Preparing For Surgery  Before surgery, you can play an important role. Because skin is not sterile, your skin needs to be as free of germs as possible. You can reduce the number of germs on your skin  by washing with CHG (chlorahexidine gluconate) Soap before surgery.  CHG is an antiseptic cleaner which kills germs and bonds with the skin to continue killing germs even after washing.     Please do not use if you have an allergy to CHG or antibacterial soaps. If your skin becomes reddened/irritated stop using the CHG.  Do not shave (including legs and underarms) for at least 48 hours prior to first CHG shower. It is OK to shave your face.  Please follow these instructions  carefully.     Shower the NIGHT BEFORE SURGERY and the MORNING OF SURGERY with CHG Soap.   If you chose to wash your hair, wash your hair first as usual with your normal shampoo. After you shampoo, rinse your hair and body thoroughly to remove the shampoo.  Then [employer] and genitals (private parts) with your normal soap and rinse thoroughly to remove soap.  After that Use CHG Soap as you would any other liquid soap. You can apply CHG directly to the skin and wash gently with a scrungie or a clean washcloth.   Apply the CHG Soap to your body ONLY FROM THE NECK DOWN.  Do not use on open wounds or open sores. Avoid contact with your eyes, ears, mouth and genitals (private parts). Wash Face and genitals (private parts)  with your normal soap.   Wash thoroughly, paying special attention to the area where your surgery will be performed.  Thoroughly rinse your body with warm water from the neck down.  DO NOT shower/wash with your normal soap after using and rinsing off the CHG Soap.  Pat yourself dry with a CLEAN TOWEL.  Wear CLEAN PAJAMAS to bed the night before surgery  Place CLEAN SHEETS on your bed the night before your surgery  DO NOT SLEEP WITH PETS.   Day of Surgery:  Take a shower with CHG soap. Wear Clean/Comfortable clothing the morning of surgery Do not apply any deodorants/lotions.   Remember to brush your teeth WITH YOUR REGULAR TOOTHPASTE.   Please read over the following fact sheets that you were given.

## 2021-04-18 ENCOUNTER — Encounter (HOSPITAL_COMMUNITY)
Admission: RE | Admit: 2021-04-18 | Discharge: 2021-04-18 | Disposition: A | Discharge status: Home / Self Care | Payer: PPO | Source: Ambulatory Visit | Attending: Specialist | Admitting: Specialist

## 2021-04-18 ENCOUNTER — Other Ambulatory Visit: Payer: Self-pay

## 2021-04-18 ENCOUNTER — Encounter (HOSPITAL_COMMUNITY): Payer: Self-pay

## 2021-04-18 ENCOUNTER — Ambulatory Visit (HOSPITAL_COMMUNITY)
Admission: RE | Admit: 2021-04-18 | Discharge: 2021-04-18 | Disposition: A | Discharge status: Home / Self Care | Payer: PPO | Source: Ambulatory Visit | Attending: Orthopedic Surgery | Admitting: Orthopedic Surgery

## 2021-04-18 DIAGNOSIS — M5126 Other intervertebral disc displacement, lumbar region: Secondary | ICD-10-CM

## 2021-04-18 DIAGNOSIS — Z01818 Encounter for other preprocedural examination: Secondary | ICD-10-CM | POA: Diagnosis not present

## 2021-04-18 DIAGNOSIS — M4316 Spondylolisthesis, lumbar region: Secondary | ICD-10-CM | POA: Diagnosis not present

## 2021-04-18 HISTORY — DX: Family history of other specified conditions: Z84.89

## 2021-04-18 HISTORY — DX: Personal history of urinary calculi: Z87.442

## 2021-04-18 LAB — SURGICAL PCR SCREEN
MRSA, PCR: NEGATIVE
Staphylococcus aureus: NEGATIVE

## 2021-04-18 LAB — CBC
HCT: 38.9 % (ref 36.0–46.0)
Hemoglobin: 12.2 g/dL (ref 12.0–15.0)
MCH: 27.5 pg (ref 26.0–34.0)
MCHC: 31.4 g/dL (ref 30.0–36.0)
MCV: 87.6 fL (ref 80.0–100.0)
Platelets: 229 10*3/uL (ref 150–400)
RBC: 4.44 MIL/uL (ref 3.87–5.11)
RDW: 14.9 % (ref 11.5–15.5)
WBC: 6.3 10*3/uL (ref 4.0–10.5)
nRBC: 0 % (ref 0.0–0.2)

## 2021-04-18 LAB — BASIC METABOLIC PANEL
Anion gap: 4 — ABNORMAL LOW (ref 5–15)
BUN: 13 mg/dL (ref 8–23)
CO2: 28 mmol/L (ref 22–32)
Calcium: 9.7 mg/dL (ref 8.9–10.3)
Chloride: 105 mmol/L (ref 98–111)
Creatinine, Ser: 0.8 mg/dL (ref 0.44–1.00)
GFR, Estimated: >60 mL/min (ref >60)
Glucose, Bld: 117 mg/dL — ABNORMAL HIGH (ref 70–99)
Potassium: 4 mmol/L (ref 3.5–5.1)
Sodium: 137 mmol/L (ref 135–145)

## 2021-04-18 NOTE — Progress Notes (Signed)
PCP - Dr. Shirline Frees Cardiologist - Denies  Chest x-ray - Not indicate will get Lumbar xray 04/18/21 EKG - 04/18/21 Stress Test - 08/13/11 ECHO - 05/29/16 Cardiac Cath - 05/29/16  Sleep Study - Denies  DM - Denies  ERAS Protcol - Yes PRE-SURGERY Ensure given  COVID TEST- Given map for drive up testing site, asked her to go on 04/23/21 or 04/24/21   Anesthesia review: Yes cardiac history  Patient denies shortness of breath, fever, cough and chest pain at PAT appointment   All instructions explained to the patient, with a verbal understanding of the material. Patient agrees to go over the instructions while at home for a better understanding. Patient also instructed to wear a mask after being tested for COVID-19. The opportunity to ask questions was provided.

## 2021-04-19 ENCOUNTER — Ambulatory Visit: Payer: Self-pay | Admitting: Orthopedic Surgery

## 2021-04-19 NOTE — H&P (Signed)
Jamie Beltran is an 86 y.o. female.   Chief Complaint: back and leg pain HPI: Location: low back Quality: worsening Severity: pain level 8/10 Timing: chronic Associated Symptoms: no numbness; no tingling; radiation down leg Prior Imaging: x ray; MRI Previous Injections: multiple Notes: The patient has been evaluated and treated by [facility]. [facility] Dr. Carloyn Manner, and [facility]. She has been treated with multiple injections and multiple medications. There are multiple images in canopy. Referred by Dr. Nelva Bush  Past Medical History:  Diagnosis Date   Basal cell carcinoma    Blood in stool    Breast cancer ([place]) 09/2017   left breast   Chronic back pain    Colonic polyp    COVID 03/24/2021   Diverticulitis    Diverticulosis    Esophageal reflux    Family history of adverse reaction to anesthesia    Sister has N & V   Fibrocystic breast disease    GERD (gastroesophageal reflux disease) 05/29/2016   Hiatal hernia    History of kidney stones    Hyperlipidemia 05/29/2016   Hypothyroidism 05/29/2016   Migraine 05/29/2016   Migraine headache    OA (osteoarthritis)    back, hips, knees   Other and unspecified hyperlipidemia     Past Surgical History:  Procedure Laterality Date   ABDOMINAL HYSTERECTOMY     [place] N/A 05/29/2016   Procedure: Left Heart Cath and Coronary Angiography;  Surgeon: Sherren Mocha, MD;  Location: [facility];  Service: Cardiovascular;  Laterality: N/A;   CATARACT EXTRACTION, BILATERAL     CHOLECYSTECTOMY  2002   COLONOSCOPY  04/08   COLONOSCOPY N/A 05/11/2020   Procedure: DIAGNOSTIC COLONOSCOPY;  Surgeon: Jesusita Oka, MD;  Location: [facility];  Service: General;  Laterality: N/A;   ESOPHAGOGASTRODUODENOSCOPY  9/06, 4/08   FOOT SURGERY  1990   HEEL SPUR EXCISION  2001   KNEE ARTHROSCOPY Bilateral 2006, 2013   MASTECTOMY Left 09/23/2017   TOTAL ABDOMINAL HYSTERECTOMY W/  BILATERAL SALPINGOOPHORECTOMY  1981   TOTAL MASTECTOMY Left 10/31/2017   Procedure: LEFT TOTAL MASTECTOMY;  Surgeon: Excell Seltzer, MD;  Location: [facility];  Service: General;  Laterality: Left;   VEIN LIGATION Left 1980    Family History  Problem Relation Age of Onset   Hypertension Father    Heart attack Mother    Kidney Stones Sister        kidney problems   Kidney Stones Sister        kidney transplant   Breast cancer Maternal Aunt    Lung cancer Maternal Aunt    Social History:  reports that she quit smoking about 40 years ago. Her smoking use included cigarettes. She has a 20.00 pack-year smoking history. She has never used smokeless tobacco. She reports current alcohol use. She reports that she does not use drugs.  Allergies:  Allergies  Allergen Reactions   Codeine Other (See Comments)    "Makes me crazy."   Penicillins Hives and Swelling    Has patient had a PCN reaction causing immediate rash, facial/tongue/throat swelling, SOB or lightheadedness with hypotension: Yes Has patient had a PCN reaction causing severe rash involving mucus membranes or skin necrosis: No Has patient had a PCN reaction that required hospitalization No Has patient had a PCN reaction occurring within the last 10 years: No If all of the above answers are "NO", then may proceed with Cephalosporin use.  Adhesive [Tape] Itching    Heart monitor pads   Protonix [Pantoprazole Sodium] Diarrhea    With 40 mg twice daily, can tolerate 40 mg once daily      Current meds: anastrozole 1 mg tablet diclofenac 1 % topical gel escitalopram  famotidine 20 mg tablet gabapentin 300 mg capsule HYDROcodone 10 mg-acetaminophen 325 mg tablet hydrocortisone 2.5 % topical cream levothyroxine 88 mcg tablet omeprazole 40 mg capsule,delayed release   Results for orders placed or performed during the hospital encounter of 04/18/21 (from the past 48 hour(s))  Surgical pcr screen      Status: None   Collection Time: 04/18/21 10:48 AM   Specimen: Nasal Mucosa; Nasal Swab  Result Value Ref Range   MRSA, PCR NEGATIVE NEGATIVE   Staphylococcus aureus NEGATIVE NEGATIVE    Comment: (NOTE) The Xpert SA Assay (FDA approved for NASAL specimens in patients 24 years of age and older), is one component of a comprehensive surveillance program. It is not intended to diagnose infection nor to guide or monitor treatment. Performed at [facility], [place] [address], [place], [place] [address]   CBC     Status: None   Collection Time: 04/18/21 11:18 AM  Result Value Ref Range   WBC 6.3 4.0 - 10.5 K/uL   RBC 4.44 3.87 - 5.11 MIL/uL   Hemoglobin 12.2 12.0 - 15.0 g/dL   HCT 38.9 36.0 - 46.0 %   MCV 87.6 80.0 - 100.0 fL   MCH 27.5 26.0 - 34.0 pg   MCHC 31.4 30.0 - 36.0 g/dL   RDW 14.9 11.5 - 15.5 %   Platelets 229 150 - 400 K/uL   nRBC 0.0 0.0 - 0.2 %    Comment: Performed at [facility], [place] [address] [place], [place] [id number]  Basic metabolic panel     Status: Abnormal   Collection Time: 04/18/21 11:18 AM  Result Value Ref Range   Sodium 137 135 - 145 mmol/L   Potassium 4.0 3.5 - 5.1 mmol/L   Chloride 105 98 - 111 mmol/L   CO2 28 22 - 32 mmol/L   Glucose, Bld 117 (H) 70 - 99 mg/dL    Comment: Glucose reference range applies only to samples taken after fasting for at least 8 hours.   BUN 13 8 - 23 mg/dL   Creatinine, Ser 0.80 0.44 - 1.00 mg/dL   Calcium 9.7 8.9 - 10.3 mg/dL   GFR, Estimated >60 >60 mL/min    Comment: (NOTE) Calculated using the CKD-EPI Creatinine Equation (2021)    Anion gap 4 (L) 5 - 15    Comment: Performed at [facility] [address], [place], [place] [address]   No results found.  Review of Systems  Constitutional: Negative.   HENT: Negative.    Eyes: Negative.   Respiratory: Negative.    Cardiovascular: Negative.   Endocrine: Negative.   Genitourinary: Negative.   Musculoskeletal: Negative.   Neurological:   Positive for weakness and numbness.   There were no vitals taken for this visit. Physical Exam Constitutional:      Appearance: Normal appearance.  HENT:     Head: Normocephalic.     Right Ear: External ear normal.     Left Ear: External ear normal.     Nose: Nose normal.     Mouth/Throat:     Pharynx: Oropharynx is clear.  Cardiovascular:     Rate and Rhythm: Normal rate and regular rhythm.  Pulses: Normal pulses.  Pulmonary:     Effort: Pulmonary effort is normal.  Abdominal:     General: Abdomen is flat.  Musculoskeletal:     Cervical back: Normal range of motion.     Comments: Gait and Station: Appearance: ambulating with no assistive devices and antalgic gait.  Constitutional: General Appearance: healthy-appearing and distress (mild).  Psychiatric: Mood and Affect: active and alert.  Cardiovascular System: Edema Right: none; Dorsalis and posterior tibial pulses 2+. Edema Left: none.  Abdomen: Inspection and Palpation: non-distended and no tenderness.  Skin: Inspection and palpation: no rash.  Lumbar Spine: Inspection: normal alignment. Bony Palpation of the Lumbar Spine: tender at lumbosacral junction.. Bony Palpation of the Right Hip: no tenderness of the greater trochanter and tenderness of the SI joint; Pelvis stable. Bony Palpation of the Left Hip: no tenderness of the greater trochanter and tenderness of the SI joint. Soft Tissue Palpation on the Right: No flank pain with percussion. Active Range of Motion: limited flexion and extention.  Motor Strength: L1 Motor Strength on the Right: hip flexion iliopsoas 5/5. L1 Motor Strength on the Left: hip flexion iliopsoas 5/5. L2-L4 Motor Strength on the Right: knee extension quadriceps 5/5. L2-L4 Motor Strength on the Left: knee extension quadriceps 5/5. L5 Motor Strength on the Right: ankle dorsiflexion tibialis anterior 5/5 and great toe extension extensor hallucis longus 5/5. L5 Motor Strength on the Left: ankle  dorsiflexion tibialis anterior 5/5 and great toe extension extensor hallucis longus 5/5. S1 Motor Strength on the Right: plantar flexion gastrocnemius 5/5. S1 Motor Strength on the Left: plantar flexion gastrocnemius 5/5.  Neurological System: Knee Reflex Right: normal (2). Knee Reflex Left: normal (2). Ankle Reflex Right: normal (2). Ankle Reflex Left: normal (2). Babinski Reflex Right: plantar reflex absent. Babinski Reflex Left: plantar reflex absent. Sensation on the Right: normal distal extremities. Sensation on the Left: normal distal extremities. Special Tests on the Right: no clonus of the ankle/knee and seated straight leg raising test positive. Special Tests on the Left: no clonus of the ankle/knee and seated straight leg raising test positive.  Patient has EHL weakness bilaterally. Straight leg raise low back pain buttock pain on the left.  Skin:    General: Skin is warm and dry.  Neurological:     Mental Status: She is alert.    Three-view x-rays lumbar spine demonstrates a grade 1 spondylolisthesis at L4-5 without instability flexion-extension.  MRI of the lumbar spine demonstrates multifactorial severe spinal stenosis at L4-5. Moderately severe centrally and severe lateral recess stenosis left greater than right. Foramen are patent.   Assessment/Plan Impression:  Patient has static left lower extremity radicular pain secondary to severe lateral recess stenosis at L4-5 and neurogenic claudication with underlying grade 1 spondylolisthesis at L4-5 without instability in flexion extension. Foramen are patent. Moderate disc base narrowing. Favorably oriented facets. No help from epidural steroid injection. Progressive stenosis from her previous MRI  Underlying osteoporosis    Plan:  We discussed options including living with her symptoms. I had a discussion with the patient concerning their pathology, relevant anatomy and treatment options.  Specifically discussing spinal  stenosis. I also discussed the natural history and the progression of spinal stenosis. That the spinal canal dimensions increase with flexion and sitting while it decreases with extension such as standing straight in one position or lying flat on one's back or stomach.  The concepts of avoiding extension and favoring flexion throughout their activities of daily living to avoid neural compression were discussed.  Also utilization of a stepstool while standing or sitting to tilt the pelvis to open up the spinal canal as well as gently leaning forward both with walking and with activity. Use of hiking sticks versus a rolling walker in more severe cases to promote forward flexion while walking. Exercise utilizing a stationary or recumbent bike to allow for more room inside the spinal canal. Avoiding extension such as direct overhead work as well as Radio producer on a hard surface. Utilizing a wedge underneath the knees while sleeping on their back and a pillow between the knees when sleeping on their sides. And that typically a softer mattress is more accommodating than a hard one.  Patient is underlying osteoporosis. She reports he is not able live with her symptoms since there is static at this point we therefore discussed lumbar decompression. And lateral mass fusion given that she has underlying osteoporosis precluding hardware instrumentation  I had an extensive discussion with the patient concerning the pathology relevant anatomy and treatment options. At this point exhausting conservative treatment and in the presence of a neurologic deficit we discussed microlumbar decompression. I discussed the risks and benefits including bleeding, infection, DVT, PE, anesthetic complications, worsening in their symptoms, improvement in their symptoms, C SF leakage, epidural fibrosis, need for future surgeries such as revision discectomy and lumbar fusion. I also indicated that this is an operation to basically decompress  the nerve root to allow recovery . And that a fusion would be performed to help stabilize the spondylolisthesis to reduce the risk of postoperative instability. Utilizing her bone graft and allograft bone. A brace following. We discussed 6 months to fusion though she can ambulate early.  I discussed the operative course including overnight in the hospital. Immediate ambulation. Follow-up in 2 weeks for suture removal. 6 weeks until healing of the herniation followed by 6 weeks of reconditioning and strengthening of the core musculature. Also discussed the need to employ the concepts of disc pressure management and core motion following the surgery to minimize the risk of recurrent disc herniation. We will obtain preoperative clearance i if necessary and proceed accordingly.  Patient has had a history of a DVT in her left leg following a fibular fracture. It was a peroneal vein. Not a popliteal vein or proximal vein. No family history. She has had surgery since that without anticoagulation no issues  We could utilize teds and SCDs mobilize her early.  She has hives to penicillin we will use vancomycin and gentamicin  Plan microlumbar decompression L4-5, lateral mass fusion using autograft and allograft bone  Cecilie Kicks, PA-C for Dr. Tonita Cong 04/19/2021, 1:02 PM

## 2021-04-19 NOTE — H&P (View-Only) (Signed)
Jamie Beltran is an 86 y.o. female.   Chief Complaint: back and leg pain HPI: Location: low back Quality: worsening Severity: pain level 8/10 Timing: chronic Associated Symptoms: no numbness; no tingling; radiation down leg Prior Imaging: x ray; MRI Previous Injections: multiple Notes: The patient has been evaluated and treated by [facility]. [facility] Dr. Carloyn Manner, and [facility]. She has been treated with multiple injections and multiple medications. There are multiple images in canopy. Referred by Dr. Nelva Bush  Past Medical History:  Diagnosis Date   Basal cell carcinoma    Blood in stool    Breast cancer ([place]) 09/2017   left breast   Chronic back pain    Colonic polyp    COVID 03/24/2021   Diverticulitis    Diverticulosis    Esophageal reflux    Family history of adverse reaction to anesthesia    Sister has N & V   Fibrocystic breast disease    GERD (gastroesophageal reflux disease) 05/29/2016   Hiatal hernia    History of kidney stones    Hyperlipidemia 05/29/2016   Hypothyroidism 05/29/2016   Migraine 05/29/2016   Migraine headache    OA (osteoarthritis)    back, hips, knees   Other and unspecified hyperlipidemia     Past Surgical History:  Procedure Laterality Date   ABDOMINAL HYSTERECTOMY     [place] N/A 05/29/2016   Procedure: Left Heart Cath and Coronary Angiography;  Surgeon: Sherren Mocha, MD;  Location: [place] [facility];  Service: Cardiovascular;  Laterality: N/A;   CATARACT EXTRACTION, BILATERAL     CHOLECYSTECTOMY  2002   COLONOSCOPY  04/08   COLONOSCOPY N/A 05/11/2020   Procedure: DIAGNOSTIC COLONOSCOPY;  Surgeon: Jesusita Oka, MD;  [facility]: [facility];  Service: General;  Laterality: N/A;   ESOPHAGOGASTRODUODENOSCOPY  9/06, 4/08   FOOT SURGERY  1990   HEEL SPUR EXCISION  2001   KNEE ARTHROSCOPY Bilateral 2006, 2013   MASTECTOMY Left 09/23/2017   TOTAL ABDOMINAL HYSTERECTOMY W/  BILATERAL SALPINGOOPHORECTOMY  1981   TOTAL MASTECTOMY Left 10/31/2017   Procedure: LEFT TOTAL MASTECTOMY;  Surgeon: Excell Seltzer, MD;  Location: [place];  Service: General;  Laterality: Left;   VEIN LIGATION Left 1980    Family History  Problem Relation Age of Onset   Hypertension Father    Heart attack Mother    Kidney Stones Sister        kidney problems   Kidney Stones Sister        kidney transplant   Breast cancer Maternal Aunt    Lung cancer Maternal Aunt    Social History:  reports that she quit smoking about 40 years ago. Her smoking use included cigarettes. She has a 20.00 pack-year smoking history. She has never used smokeless tobacco. She reports current alcohol use. She reports that she does not use drugs.  Allergies:  Allergies  Allergen Reactions   Codeine Other (See Comments)    "Makes me crazy."   Penicillins Hives and Swelling    Has patient had a PCN reaction causing immediate rash, facial/tongue/throat swelling, SOB or lightheadedness with hypotension: Yes Has patient had a PCN reaction causing severe rash involving mucus membranes or skin necrosis: No Has patient had a PCN reaction that required hospitalization No Has patient had a PCN reaction occurring within the last 10 years: No If all of the above answers are "NO", then may proceed with Cephalosporin use.  Adhesive [Tape] Itching    Heart monitor pads   Protonix [Pantoprazole Sodium] Diarrhea    With 40 mg twice daily, can tolerate 40 mg once daily      Current meds: anastrozole 1 mg tablet diclofenac 1 % topical gel escitalopram  famotidine 20 mg tablet gabapentin 300 mg capsule HYDROcodone 10 mg-acetaminophen 325 mg tablet hydrocortisone 2.5 % topical cream levothyroxine 88 mcg tablet omeprazole 40 mg capsule,delayed release   Results for orders placed or performed during the hospital encounter of 04/18/21 (from the past 48 hour(s))  Surgical pcr screen      Status: None   Collection Time: 04/18/21 10:48 AM   Specimen: Nasal Mucosa; Nasal Swab  Result Value Ref Range   MRSA, PCR NEGATIVE NEGATIVE   Staphylococcus aureus NEGATIVE NEGATIVE    Comment: (NOTE) The Xpert SA Assay (FDA approved for NASAL specimens in patients 45 years of age and older), is one component of a comprehensive surveillance program. It is not intended to diagnose infection nor to guide or monitor treatment. Performed at [facility], [place] [address], [place], [place] [address]   CBC     Status: None   Collection Time: 04/18/21 11:18 AM  Result Value Ref Range   WBC 6.3 4.0 - 10.5 K/uL   RBC 4.44 3.87 - 5.11 MIL/uL   Hemoglobin 12.2 12.0 - 15.0 g/dL   HCT 38.9 36.0 - 46.0 %   MCV 87.6 80.0 - 100.0 fL   MCH 27.5 26.0 - 34.0 pg   MCHC 31.4 30.0 - 36.0 g/dL   RDW 14.9 11.5 - 15.5 %   Platelets 229 150 - 400 K/uL   nRBC 0.0 0.0 - 0.2 %    Comment: Performed at [facility], [place] [address], [place], [place] [id number]  Basic metabolic panel     Status: Abnormal   Collection Time: 04/18/21 11:18 AM  Result Value Ref Range   Sodium 137 135 - 145 mmol/L   Potassium 4.0 3.5 - 5.1 mmol/L   Chloride 105 98 - 111 mmol/L   CO2 28 22 - 32 mmol/L   Glucose, Bld 117 (H) 70 - 99 mg/dL    Comment: Glucose reference range applies only to samples taken after fasting for at least 8 hours.   BUN 13 8 - 23 mg/dL   Creatinine, Ser 0.80 0.44 - 1.00 mg/dL   Calcium 9.7 8.9 - 10.3 mg/dL   GFR, Estimated >60 >60 mL/min    Comment: (NOTE) Calculated using the CKD-EPI Creatinine Equation (2021)    Anion gap 4 (L) 5 - 15    Comment: Performed at [facility] [address], [place],  [address]   No results found.  Review of Systems  Constitutional: Negative.   HENT: Negative.    Eyes: Negative.   Respiratory: Negative.    Cardiovascular: Negative.   Endocrine: Negative.   Genitourinary: Negative.   Musculoskeletal: Negative.   Neurological:   Positive for weakness and numbness.   There were no vitals taken for this visit. Physical Exam Constitutional:      Appearance: Normal appearance.  HENT:     Head: Normocephalic.     Right Ear: External ear normal.     Left Ear: External ear normal.     Nose: Nose normal.     Mouth/Throat:     Pharynx: Oropharynx is clear.  Cardiovascular:     Rate and Rhythm: Normal rate and regular rhythm.  Pulses: Normal pulses.  Pulmonary:     Effort: Pulmonary effort is normal.  Abdominal:     General: Abdomen is flat.  Musculoskeletal:     Cervical back: Normal range of motion.     Comments: Gait and Station: Appearance: ambulating with no assistive devices and antalgic gait.  Constitutional: General Appearance: healthy-appearing and distress (mild).  Psychiatric: Mood and Affect: active and alert.  Cardiovascular System: Edema Right: none; Dorsalis and posterior tibial pulses 2+. Edema Left: none.  Abdomen: Inspection and Palpation: non-distended and no tenderness.  Skin: Inspection and palpation: no rash.  Lumbar Spine: Inspection: normal alignment. Bony Palpation of the Lumbar Spine: tender at lumbosacral junction.. Bony Palpation of the Right Hip: no tenderness of the greater trochanter and tenderness of the SI joint; Pelvis stable. Bony Palpation of the Left Hip: no tenderness of the greater trochanter and tenderness of the SI joint. Soft Tissue Palpation on the Right: No flank pain with percussion. Active Range of Motion: limited flexion and extention.  Motor Strength: L1 Motor Strength on the Right: hip flexion iliopsoas 5/5. L1 Motor Strength on the Left: hip flexion iliopsoas 5/5. L2-L4 Motor Strength on the Right: knee extension quadriceps 5/5. L2-L4 Motor Strength on the Left: knee extension quadriceps 5/5. L5 Motor Strength on the Right: ankle dorsiflexion tibialis anterior 5/5 and great toe extension extensor hallucis longus 5/5. L5 Motor Strength on the Left: ankle  dorsiflexion tibialis anterior 5/5 and great toe extension extensor hallucis longus 5/5. S1 Motor Strength on the Right: plantar flexion gastrocnemius 5/5. S1 Motor Strength on the Left: plantar flexion gastrocnemius 5/5.  Neurological System: Knee Reflex Right: normal (2). Knee Reflex Left: normal (2). Ankle Reflex Right: normal (2). Ankle Reflex Left: normal (2). Babinski Reflex Right: plantar reflex absent. Babinski Reflex Left: plantar reflex absent. Sensation on the Right: normal distal extremities. Sensation on the Left: normal distal extremities. Special Tests on the Right: no clonus of the ankle/knee and seated straight leg raising test positive. Special Tests on the Left: no clonus of the ankle/knee and seated straight leg raising test positive.  Patient has EHL weakness bilaterally. Straight leg raise low back pain buttock pain on the left.  Skin:    General: Skin is warm and dry.  Neurological:     Mental Status: She is alert.    Three-view x-rays lumbar spine demonstrates a grade 1 spondylolisthesis at L4-5 without instability flexion-extension.  MRI of the lumbar spine demonstrates multifactorial severe spinal stenosis at L4-5. Moderately severe centrally and severe lateral recess stenosis left greater than right. Foramen are patent.   Assessment/Plan Impression:  Patient has static left lower extremity radicular pain secondary to severe lateral recess stenosis at L4-5 and neurogenic claudication with underlying grade 1 spondylolisthesis at L4-5 without instability in flexion extension. Foramen are patent. Moderate disc base narrowing. Favorably oriented facets. No help from epidural steroid injection. Progressive stenosis from her previous MRI  Underlying osteoporosis    Plan:  We discussed options including living with her symptoms. I had a discussion with the patient concerning their pathology, relevant anatomy and treatment options.  Specifically discussing spinal  stenosis. I also discussed the natural history and the progression of spinal stenosis. That the spinal canal dimensions increase with flexion and sitting while it decreases with extension such as standing straight in one position or lying flat on one's back or stomach.  The concepts of avoiding extension and favoring flexion throughout their activities of daily living to avoid neural compression were discussed.  Also utilization of a stepstool while standing or sitting to tilt the pelvis to open up the spinal canal as well as gently leaning forward both with walking and with activity. Use of hiking sticks versus a rolling walker in more severe cases to promote forward flexion while walking. Exercise utilizing a stationary or recumbent bike to allow for more room inside the spinal canal. Avoiding extension such as direct overhead work as well as Radio producer on a hard surface. Utilizing a wedge underneath the knees while sleeping on their back and a pillow between the knees when sleeping on their sides. And that typically a softer mattress is more accommodating than a hard one.  Patient is underlying osteoporosis. She reports he is not able live with her symptoms since there is static at this point we therefore discussed lumbar decompression. And lateral mass fusion given that she has underlying osteoporosis precluding hardware instrumentation  I had an extensive discussion with the patient concerning the pathology relevant anatomy and treatment options. At this point exhausting conservative treatment and in the presence of a neurologic deficit we discussed microlumbar decompression. I discussed the risks and benefits including bleeding, infection, DVT, PE, anesthetic complications, worsening in their symptoms, improvement in their symptoms, C SF leakage, epidural fibrosis, need for future surgeries such as revision discectomy and lumbar fusion. I also indicated that this is an operation to basically decompress  the nerve root to allow recovery . And that a fusion would be performed to help stabilize the spondylolisthesis to reduce the risk of postoperative instability. Utilizing her bone graft and allograft bone. A brace following. We discussed 6 months to fusion though she can ambulate early.  I discussed the operative course including overnight in the hospital. Immediate ambulation. Follow-up in 2 weeks for suture removal. 6 weeks until healing of the herniation followed by 6 weeks of reconditioning and strengthening of the core musculature. Also discussed the need to employ the concepts of disc pressure management and core motion following the surgery to minimize the risk of recurrent disc herniation. We will obtain preoperative clearance i if necessary and proceed accordingly.  Patient has had a history of a DVT in her left leg following a fibular fracture. It was a peroneal vein. Not a popliteal vein or proximal vein. No family history. She has had surgery since that without anticoagulation no issues  We could utilize teds and SCDs mobilize her early.  She has hives to penicillin we will use vancomycin and gentamicin  Plan microlumbar decompression L4-5, lateral mass fusion using autograft and allograft bone  Cecilie Kicks, PA-C for Dr. Tonita Cong 04/19/2021, 1:02 PM

## 2021-04-23 ENCOUNTER — Other Ambulatory Visit: Payer: Self-pay | Admitting: Specialist

## 2021-04-24 LAB — SARS CORONAVIRUS 2 (TAT 6-24 HRS): SARS Coronavirus 2: POSITIVE — AB

## 2021-04-24 NOTE — Anesthesia Preprocedure Evaluation (Addendum)
Anesthesia Evaluation  Patient identified by MRN, date of birth, ID band Patient awake    Reviewed: Allergy & Precautions, NPO status , Patient's Chart, lab work & pertinent test results  Airway Mallampati: II  TM Distance: >3 FB Neck ROM: Full    Dental no notable dental hx. (+) Dental Advisory Given, Teeth Intact   Pulmonary neg pulmonary ROS, former smoker,    Pulmonary exam normal breath sounds clear to auscultation       Cardiovascular negative cardio ROS Normal cardiovascular exam Rhythm:Regular Rate:Normal     Neuro/Psych  Headaches,    GI/Hepatic Neg liver ROS, hiatal hernia, GERD  Medicated,  Endo/Other  Hypothyroidism   Renal/GU negative Renal ROS     Musculoskeletal  (+) Arthritis ,   Abdominal   Peds  Hematology negative hematology ROS (+)   Anesthesia Other Findings   Reproductive/Obstetrics                                                            Anesthesia Evaluation  Patient identified by MRN, date of birth, ID band Patient awake    Reviewed: Allergy & Precautions, NPO status , Patient's Chart, lab work & pertinent test results  Airway Mallampati: II  TM Distance: >3 FB Neck ROM: Full    Dental no notable dental hx. (+) Teeth Intact, Dental Advisory Given   Pulmonary former smoker,    Pulmonary exam normal breath sounds clear to auscultation       Cardiovascular Exercise Tolerance: Good Normal cardiovascular exam Rhythm:Regular Rate:Normal     Neuro/Psych  Headaches, negative psych ROS   GI/Hepatic Neg liver ROS, hiatal hernia, GERD  ,  Endo/Other  Hypothyroidism   Renal/GU negative Renal ROS     Musculoskeletal  (+) Arthritis ,   Abdominal   Peds  Hematology negative hematology ROS (+)   Anesthesia Other Findings   Reproductive/Obstetrics                            Anesthesia Physical Anesthesia  Plan  ASA: III  Anesthesia Plan: MAC   Post-op Pain Management:    Induction:   PONV Risk Score and Plan: Treatment may vary due to age or medical condition  Airway Management Planned: Natural Airway  Additional Equipment: None  Intra-op Plan:   Post-operative Plan:   Informed Consent: I have reviewed the patients History and Physical, chart, labs and discussed the procedure including the risks, benefits and alternatives for the proposed anesthesia with the patient or authorized representative who has indicated his/her understanding and acceptance.     Dental advisory given  Plan Discussed with: CRNA  Anesthesia Plan Comments: (Colonoscopy for rectal bleeding under [facility])       Anesthesia Quick Evaluation  Anesthesia Physical Anesthesia Plan  ASA: 2  Anesthesia Plan: General   Post-op Pain Management:    Induction: Intravenous  PONV Risk Score and Plan: 4 or greater and Ondansetron, Dexamethasone, Midazolam, Treatment may vary due to age or medical condition and Diphenhydramine  Airway Management Planned: Oral ETT  Additional Equipment: None  Intra-op Plan:   Post-operative Plan: Extubation in OR  Informed Consent: I have reviewed the patients History and Physical, chart, labs and discussed the procedure including the risks, benefits and alternatives  for the proposed anesthesia with the patient or authorized representative who has indicated his/her understanding and acceptance.     Dental advisory given  Plan Discussed with: CRNA  Anesthesia Plan Comments: (See PAT note written 04/24/2021 by Myra Gianotti, PA-C. 04/23/21 COVID-19 test +, but surgery already delayed (> 21 days) after she notified staff that she had a home positive test on or just before 04/03/21. As of 04/24/21, ortho staff confirmed patient without active COVID symptoms. + 04/23/21 appears related to her known COVID infection within the past 90 days and not due to active infection.  )       Anesthesia Quick Evaluation

## 2021-04-24 NOTE — Progress Notes (Addendum)
Anesthesia Chart Review:  Case: [id number] Date/Time: 04/26/21 0715   Procedure: Microlumbar decompression L4-5, lateral mass fusion using autologus allograft bone   Anesthesia type: General   Pre-op diagnosis: Spinal stenosis, spondylolisthesis L4-5   Location: [facility] OR ROOM 78 / [facility]   Surgeons: Susa Day, MD       DISCUSSION: Patient is an 86 year old female scheduled for the above procedure.  Surgery had initially been planned for mid July but when she did not show for her 04/03/21 PAT visit, patient told us she had COVID-19 and surgery was going to be rescheduled.  Surgery was rescheduled for 04/26/21. At her 04/18/21 PAT visit, she could not produce a copy of her rapid home COVID-19 test from July 2022, so staff still advised her to get a preoperative COVID-19 test.  Based on her history and documentation of a home + COVID-19 last month, a positive PCR test would not be unexpected. Dr. Reather Littler staff have contacted patient and confirmed that she is no longer having any active symptoms of COVID-19. New surgery date with be > 21 days from when she told our staff about having [place] on 04/03/21. Dr. Tonita Cong or APP to review and provide documentation if he would like to proceed as scheduled.   Other history includes former smoker (quit 09/23/80), HLD, hypothyroidism, GERD, hiatal hernia, chronic back pain, skin cancer ([facility]), left breast cancer (s/p left mastectomy 10/31/17), migraines.  She had normal coronary arteries by 2017 [facility].  Anesthesia team to evaluate on the day of surgery.   ADDENDUM 04/25/21 10:36 PM: Dr. Reather Littler staff have been in communication with patient's PCP Dr. Kenton Kingfisher' staff. Documentation received dated 04/09/21 that patient was positive for COVID 3 weeks prior and was treated with antiviral pills. She was asymptomatic at that time of the 04/09/21 call, but was concerned because her test was still reading as positive. He communicated with her, "Often patients will continue to test positive for  weeks after they have had COVID, but after 10 days she is no longer contagious." Dr. Tonita Cong is aware and plans to proceed as patient's recent + test on 04/23/21 appears related to previous infection within past 90 days and not an active infection.    VS: BP (!) 141/70   Pulse 76   Temp 36.8 C (Oral)   Resp 17   Ht '5\' 4"'$  (1.626 m)   Wt 69.1 kg   SpO2 98%   BMI 26.14 kg/m    PROVIDERS: Shirline Frees, MD is PCP  Nicholas Lose, MD is HEM-ONC She is not routinely followed by cardiology, but has been evaluated by Candee Furbish, MD and Lauree Chandler, MD in the past. Normal coronaries in 05/2016.    LABS: Labs reviewed: Acceptable for surgery. (all labs ordered are listed, but only abnormal results are displayed)  Labs Reviewed  BASIC METABOLIC PANEL - Abnormal; Notable for the following components:      Result Value   Glucose, Bld 117 (*)    Anion gap 4 (*)    All other components within normal limits  SURGICAL PCR SCREEN  CBC     IMAGES: Xray L-spine 04/18/21: IMPRESSION: Grade 1 anterolisthesis of L4 on L5 measuring approximately 7 mm in the neutral position. Facet hypertrophy predominantly at L4-5 and L5-S1.   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 Systolic function was    normal. The estimated ejection fraction was in the range of 60%    to 65%. Wall motion was normal; there were no regional wall    motion abnormalities. There was an increased relative    contribution of atrial contraction to ventricular filling.    Doppler parameters are consistent with  abnormal left ventricular    relaxation (grade 1 diastolic dysfunction).  - Aortic valve: Trileaflet; normal thickness, mildly calcified    leaflets.   Cardiac event monitor 05/19/14-06/17/14: No afib.   US Carotid 07/11/11:  IMPRESSION:  No significant carotid stenosis identified.  Estimated bilateral  ICA stenoses are less than 50%.  Past Medical History:  Diagnosis Date   Basal cell carcinoma    Blood in stool    Breast cancer ([place]) 09/2017   left breast   Chronic back pain    Colonic polyp    COVID 03/24/2021   Diverticulitis    Diverticulosis    Esophageal reflux    Family history of adverse reaction to anesthesia    Sister has N & V   Fibrocystic breast disease    GERD (gastroesophageal reflux disease) 05/29/2016   Hiatal hernia    History of kidney stones    Hyperlipidemia 05/29/2016   Hypothyroidism 05/29/2016   Migraine 05/29/2016   Migraine headache    OA (osteoarthritis)    back, hips, knees   Other and unspecified hyperlipidemia     Past Surgical History:  Procedure Laterality Date   ABDOMINAL HYSTERECTOMY     [place] N/A 05/29/2016   Procedure: Left Heart Cath and Coronary Angiography;  Surgeon: Sherren Mocha, MD;  Location: [facility];  Service: Cardiovascular;  Laterality: N/A;   CATARACT EXTRACTION, BILATERAL     CHOLECYSTECTOMY  2002   COLONOSCOPY  04/08   COLONOSCOPY N/A 05/11/2020   Procedure: DIAGNOSTIC COLONOSCOPY;  Surgeon: Jesusita Oka, MD;  [facility];  Service: General;  Laterality: N/A;   ESOPHAGOGASTRODUODENOSCOPY  9/06, 4/08   FOOT SURGERY  1990   HEEL SPUR EXCISION  2001   KNEE ARTHROSCOPY Bilateral 2006, 2013   MASTECTOMY Left 09/23/2017   TOTAL ABDOMINAL HYSTERECTOMY W/ BILATERAL SALPINGOOPHORECTOMY  1981   TOTAL MASTECTOMY Left 10/31/2017   Procedure: LEFT TOTAL MASTECTOMY;  Surgeon: Excell Seltzer, MD;  Location: [facility];  Service: General;   Laterality: Left;   VEIN LIGATION Left 1980    MEDICATIONS:  anastrozole (ARIMIDEX) 1 MG tablet   Cholecalciferol (VITAMIN D) 50 MCG (2000 UT) tablet   conjugated estrogens (PREMARIN) vaginal cream   cycloSPORINE (RESTASIS) 0.05 % ophthalmic emulsion   diclofenac sodium (VOLTAREN) 1 % GEL   escitalopram (LEXAPRO) 20 MG tablet   famotidine (PEPCID) 20 MG tablet   gabapentin (NEURONTIN) 300 MG capsule   HYDROcodone-acetaminophen (NORCO) 10-325 MG per tablet   hydrocortisone 2.5 % cream   levothyroxine (SYNTHROID) 88 MCG tablet   Magnesium 250 MG TABS   omeprazole (PRILOSEC) 40 MG capsule   No current facility-administered medications for this encounter.    Myra Gianotti, PA-C Surgical Short Stay/Anesthesiology [facility] Phone [phone] [facility] Phone [phone] 04/24/2021 11:56 AM

## 2021-04-24 NOTE — Progress Notes (Signed)
TC to [place] @ [facility] to inform of positive covid test. Per guidelines case should be rescheduled 10 days after today unless classified urgent or emergent.

## 2021-04-26 ENCOUNTER — Ambulatory Visit (HOSPITAL_COMMUNITY): Payer: PPO | Admitting: Anesthesiology

## 2021-04-26 ENCOUNTER — Other Ambulatory Visit: Payer: Self-pay

## 2021-04-26 ENCOUNTER — Encounter (HOSPITAL_COMMUNITY)
Admission: RE | Disposition: A | Discharge status: Home / Self Care | Payer: Self-pay | Source: Home / Self Care | Attending: Specialist

## 2021-04-26 ENCOUNTER — Inpatient Hospital Stay (HOSPITAL_COMMUNITY)
Admission: RE | Admit: 2021-04-26 | Discharge: 2021-05-01 | DRG: 460 | Disposition: A | Discharge status: Home / Self Care | Payer: PPO | Attending: Specialist | Admitting: Specialist

## 2021-04-26 ENCOUNTER — Ambulatory Visit (HOSPITAL_COMMUNITY): Payer: PPO | Admitting: Vascular Surgery

## 2021-04-26 ENCOUNTER — Encounter (HOSPITAL_COMMUNITY): Payer: Self-pay | Admitting: Specialist

## 2021-04-26 ENCOUNTER — Ambulatory Visit (HOSPITAL_COMMUNITY): Payer: PPO

## 2021-04-26 DIAGNOSIS — Z79899 Other long term (current) drug therapy: Secondary | ICD-10-CM

## 2021-04-26 DIAGNOSIS — G8929 Other chronic pain: Secondary | ICD-10-CM | POA: Diagnosis present

## 2021-04-26 DIAGNOSIS — Z8616 Personal history of COVID-19: Secondary | ICD-10-CM | POA: Diagnosis not present

## 2021-04-26 DIAGNOSIS — K219 Gastro-esophageal reflux disease without esophagitis: Secondary | ICD-10-CM | POA: Diagnosis present

## 2021-04-26 DIAGNOSIS — Z853 Personal history of malignant neoplasm of breast: Secondary | ICD-10-CM

## 2021-04-26 DIAGNOSIS — M549 Dorsalgia, unspecified: Secondary | ICD-10-CM | POA: Diagnosis present

## 2021-04-26 DIAGNOSIS — Z880 Allergy status to penicillin: Secondary | ICD-10-CM

## 2021-04-26 DIAGNOSIS — M48062 Spinal stenosis, lumbar region with neurogenic claudication: Secondary | ICD-10-CM | POA: Diagnosis present

## 2021-04-26 DIAGNOSIS — R42 Dizziness and giddiness: Secondary | ICD-10-CM | POA: Diagnosis not present

## 2021-04-26 DIAGNOSIS — M48061 Spinal stenosis, lumbar region without neurogenic claudication: Secondary | ICD-10-CM | POA: Diagnosis present

## 2021-04-26 DIAGNOSIS — Z801 Family history of malignant neoplasm of trachea, bronchus and lung: Secondary | ICD-10-CM

## 2021-04-26 DIAGNOSIS — Z87891 Personal history of nicotine dependence: Secondary | ICD-10-CM | POA: Diagnosis not present

## 2021-04-26 DIAGNOSIS — Z86718 Personal history of other venous thrombosis and embolism: Secondary | ICD-10-CM

## 2021-04-26 DIAGNOSIS — Z79890 Hormone replacement therapy (postmenopausal): Secondary | ICD-10-CM

## 2021-04-26 DIAGNOSIS — E785 Hyperlipidemia, unspecified: Secondary | ICD-10-CM | POA: Diagnosis present

## 2021-04-26 DIAGNOSIS — Z8719 Personal history of other diseases of the digestive system: Secondary | ICD-10-CM

## 2021-04-26 DIAGNOSIS — Z85828 Personal history of other malignant neoplasm of skin: Secondary | ICD-10-CM | POA: Diagnosis not present

## 2021-04-26 DIAGNOSIS — M5416 Radiculopathy, lumbar region: Secondary | ICD-10-CM | POA: Diagnosis not present

## 2021-04-26 DIAGNOSIS — Z885 Allergy status to narcotic agent status: Secondary | ICD-10-CM

## 2021-04-26 DIAGNOSIS — Z9012 Acquired absence of left breast and nipple: Secondary | ICD-10-CM | POA: Diagnosis not present

## 2021-04-26 DIAGNOSIS — Z888 Allergy status to other drugs, medicaments and biological substances status: Secondary | ICD-10-CM

## 2021-04-26 DIAGNOSIS — E039 Hypothyroidism, unspecified: Secondary | ICD-10-CM | POA: Diagnosis present

## 2021-04-26 DIAGNOSIS — Z90710 Acquired absence of both cervix and uterus: Secondary | ICD-10-CM

## 2021-04-26 DIAGNOSIS — Z20822 Contact with and (suspected) exposure to covid-19: Secondary | ICD-10-CM | POA: Diagnosis present

## 2021-04-26 DIAGNOSIS — Z87442 Personal history of urinary calculi: Secondary | ICD-10-CM

## 2021-04-26 DIAGNOSIS — Z8249 Family history of ischemic heart disease and other diseases of the circulatory system: Secondary | ICD-10-CM | POA: Diagnosis not present

## 2021-04-26 DIAGNOSIS — Z803 Family history of malignant neoplasm of breast: Secondary | ICD-10-CM | POA: Diagnosis not present

## 2021-04-26 DIAGNOSIS — M4726 Other spondylosis with radiculopathy, lumbar region: Secondary | ICD-10-CM | POA: Diagnosis present

## 2021-04-26 DIAGNOSIS — R5383 Other fatigue: Secondary | ICD-10-CM | POA: Diagnosis not present

## 2021-04-26 DIAGNOSIS — Z9049 Acquired absence of other specified parts of digestive tract: Secondary | ICD-10-CM | POA: Diagnosis not present

## 2021-04-26 DIAGNOSIS — M810 Age-related osteoporosis without current pathological fracture: Secondary | ICD-10-CM | POA: Diagnosis present

## 2021-04-26 DIAGNOSIS — G9519 Other vascular myelopathies: Secondary | ICD-10-CM | POA: Diagnosis not present

## 2021-04-26 DIAGNOSIS — Z419 Encounter for procedure for purposes other than remedying health state, unspecified: Secondary | ICD-10-CM

## 2021-04-26 DIAGNOSIS — Z90722 Acquired absence of ovaries, bilateral: Secondary | ICD-10-CM

## 2021-04-26 DIAGNOSIS — M4316 Spondylolisthesis, lumbar region: Secondary | ICD-10-CM | POA: Diagnosis present

## 2021-04-26 HISTORY — PX: LUMBAR LAMINECTOMY/DECOMPRESSION MICRODISCECTOMY: SHX5026

## 2021-04-26 LAB — TYPE AND SCREEN
ABO/RH(D): O POS
Antibody Screen: NEGATIVE

## 2021-04-26 SURGERY — LUMBAR LAMINECTOMY/DECOMPRESSION MICRODISCECTOMY 1 LEVEL
Anesthesia: General | Site: Back

## 2021-04-26 MED ORDER — PANTOPRAZOLE SODIUM 40 MG PO TBEC
40.0000 mg | DELAYED_RELEASE_TABLET | Freq: Every day | ORAL | Status: DC
  Administered 2021-04-28 – 2021-05-01 (×4): 40 mg via ORAL
  Filled 2021-04-26 (×5): qty 1

## 2021-04-26 MED ORDER — ORAL CARE MOUTH RINSE: 15.0000 mL | Freq: Once | OROMUCOSAL | Status: AC

## 2021-04-26 MED ORDER — MAGNESIUM OXIDE -MG SUPPLEMENT 400 (240 MG) MG PO TABS
400.0000 mg | ORAL_TABLET | Freq: Every day | ORAL | Status: DC
  Administered 2021-04-28 – 2021-05-01 (×4): 400 mg via ORAL
  Filled 2021-04-26 (×6): qty 1

## 2021-04-26 MED ORDER — HYDROCODONE-ACETAMINOPHEN 10-325 MG PO TABS: 1.0000 | ORAL_TABLET | ORAL | 0 refills | Status: AC | PRN

## 2021-04-26 MED ORDER — BISACODYL 5 MG PO TBEC: 5.0000 mg | DELAYED_RELEASE_TABLET | Freq: Every day | ORAL | Status: DC | PRN

## 2021-04-26 MED ORDER — ACETAMINOPHEN 325 MG PO TABS
650.0000 mg | ORAL_TABLET | ORAL | Status: DC | PRN
  Administered 2021-04-27 – 2021-04-30 (×4): 650 mg via ORAL
  Filled 2021-04-26 (×4): qty 2

## 2021-04-26 MED ORDER — MENTHOL 3 MG MT LOZG: 1.0000 | LOZENGE | OROMUCOSAL | Status: DC | PRN

## 2021-04-26 MED ORDER — VANCOMYCIN HCL IN DEXTROSE 1-5 GM/200ML-% IV SOLN
1000.0000 mg | INTRAVENOUS | Status: AC
  Administered 2021-04-26: 1000 mg via INTRAVENOUS
  Filled 2021-04-26: qty 200

## 2021-04-26 MED ORDER — ACETAMINOPHEN 650 MG RE SUPP: 650.0000 mg | RECTAL | Status: DC | PRN

## 2021-04-26 MED ORDER — GENTAMICIN SULFATE 40 MG/ML IJ SOLN
70.0000 mg | INTRAVENOUS | Status: AC
  Administered 2021-04-26: 70 mg via INTRAVENOUS
  Filled 2021-04-26: qty 1.75

## 2021-04-26 MED ORDER — THROMBIN 20000 UNITS EX SOLR
CUTANEOUS | Status: AC
  Filled 2021-04-26: qty 20000

## 2021-04-26 MED ORDER — POLYETHYLENE GLYCOL 3350 17 G PO PACK: 17.0000 g | PACK | Freq: Every day | ORAL | Status: DC | PRN

## 2021-04-26 MED ORDER — THROMBIN 20000 UNITS EX SOLR: CUTANEOUS | Status: DC | PRN

## 2021-04-26 MED ORDER — LIDOCAINE HCL (CARDIAC) PF 100 MG/5ML IV SOSY
PREFILLED_SYRINGE | INTRAVENOUS | Status: DC | PRN
  Administered 2021-04-26: 70 mg via INTRATRACHEAL

## 2021-04-26 MED ORDER — ONDANSETRON HCL 4 MG/2ML IJ SOLN: 4.0000 mg | Freq: Four times a day (QID) | INTRAMUSCULAR | Status: DC | PRN

## 2021-04-26 MED ORDER — HYDROMORPHONE HCL 1 MG/ML IJ SOLN
INTRAMUSCULAR | Status: AC
  Filled 2021-04-26: qty 1

## 2021-04-26 MED ORDER — PROPOFOL 10 MG/ML IV BOLUS
INTRAVENOUS | Status: DC | PRN
  Administered 2021-04-26: 100 mg via INTRAVENOUS

## 2021-04-26 MED ORDER — FAMOTIDINE 20 MG PO TABS
20.0000 mg | ORAL_TABLET | Freq: Every day | ORAL | Status: DC
  Administered 2021-04-27 – 2021-04-30 (×4): 20 mg via ORAL
  Filled 2021-04-26 (×5): qty 1

## 2021-04-26 MED ORDER — BUPIVACAINE-EPINEPHRINE 0.5% -1:200000 IJ SOLN
INTRAMUSCULAR | Status: AC
  Filled 2021-04-26: qty 1

## 2021-04-26 MED ORDER — TRANEXAMIC ACID-NACL 1000-0.7 MG/100ML-% IV SOLN
1000.0000 mg | INTRAVENOUS | Status: AC
  Administered 2021-04-26: 1000 mg via INTRAVENOUS
  Filled 2021-04-26: qty 100

## 2021-04-26 MED ORDER — PHENYLEPHRINE 40 MCG/ML (10ML) SYRINGE FOR IV PUSH (FOR BLOOD PRESSURE SUPPORT)
PREFILLED_SYRINGE | INTRAVENOUS | Status: DC | PRN
  Administered 2021-04-26 (×2): 80 ug via INTRAVENOUS
  Administered 2021-04-26: 40 ug via INTRAVENOUS
  Administered 2021-04-26: 80 ug via INTRAVENOUS

## 2021-04-26 MED ORDER — ONDANSETRON HCL 4 MG PO TABS: 4.0000 mg | ORAL_TABLET | Freq: Four times a day (QID) | ORAL | Status: DC | PRN

## 2021-04-26 MED ORDER — GENTAMICIN IN SALINE 1.2-0.9 MG/ML-% IV SOLN
60.0000 mg | INTRAVENOUS | Status: DC
  Filled 2021-04-26: qty 50

## 2021-04-26 MED ORDER — ESTROGENS, CONJUGATED 0.625 MG/GM VA CREA: 1.0000 | TOPICAL_CREAM | Freq: Every day | VAGINAL | Status: DC | PRN

## 2021-04-26 MED ORDER — LACTATED RINGERS IV SOLN: INTRAVENOUS | Status: DC

## 2021-04-26 MED ORDER — METHOCARBAMOL 1000 MG/10ML IJ SOLN
500.0000 mg | Freq: Four times a day (QID) | INTRAVENOUS | Status: DC | PRN
  Filled 2021-04-26: qty 5

## 2021-04-26 MED ORDER — SUGAMMADEX SODIUM 200 MG/2ML IV SOLN
INTRAVENOUS | Status: DC | PRN
  Administered 2021-04-26: 200 mg via INTRAVENOUS

## 2021-04-26 MED ORDER — POLYETHYLENE GLYCOL 3350 17 G PO PACK: 17.0000 g | PACK | Freq: Every day | ORAL | 0 refills | Status: DC | PRN

## 2021-04-26 MED ORDER — RISAQUAD PO CAPS
1.0000 | ORAL_CAPSULE | Freq: Every day | ORAL | Status: DC
  Administered 2021-04-28 – 2021-05-01 (×4): 1 via ORAL
  Filled 2021-04-26 (×6): qty 1

## 2021-04-26 MED ORDER — KCL IN DEXTROSE-NACL 20-5-0.45 MEQ/L-%-% IV SOLN: INTRAVENOUS | Status: AC

## 2021-04-26 MED ORDER — FENTANYL CITRATE (PF) 100 MCG/2ML IJ SOLN
INTRAMUSCULAR | Status: AC
  Filled 2021-04-26: qty 2

## 2021-04-26 MED ORDER — CYCLOSPORINE 0.05 % OP EMUL
1.0000 [drp] | Freq: Two times a day (BID) | OPHTHALMIC | Status: DC
  Administered 2021-04-26 – 2021-05-01 (×9): 1 [drp] via OPHTHALMIC
  Filled 2021-04-26 (×12): qty 1

## 2021-04-26 MED ORDER — CHLORHEXIDINE GLUCONATE 0.12 % MT SOLN
OROMUCOSAL | Status: AC
  Administered 2021-04-26: 15 mL via OROMUCOSAL
  Filled 2021-04-26: qty 15

## 2021-04-26 MED ORDER — GABAPENTIN 300 MG PO CAPS
300.0000 mg | ORAL_CAPSULE | Freq: Two times a day (BID) | ORAL | Status: DC
  Administered 2021-04-26 – 2021-05-01 (×11): 300 mg via ORAL
  Filled 2021-04-26 (×11): qty 1

## 2021-04-26 MED ORDER — ACETAMINOPHEN 500 MG PO TABS
1000.0000 mg | ORAL_TABLET | Freq: Four times a day (QID) | ORAL | Status: AC
  Administered 2021-04-26 – 2021-04-27 (×3): 1000 mg via ORAL
  Filled 2021-04-26 (×3): qty 2

## 2021-04-26 MED ORDER — VANCOMYCIN HCL IN DEXTROSE 1-5 GM/200ML-% IV SOLN
1000.0000 mg | INTRAVENOUS | Status: DC
  Administered 2021-04-27 – 2021-04-28 (×2): 1000 mg via INTRAVENOUS
  Filled 2021-04-26 (×2): qty 200

## 2021-04-26 MED ORDER — ONDANSETRON HCL 4 MG/2ML IJ SOLN
INTRAMUSCULAR | Status: DC | PRN
  Administered 2021-04-26: 4 mg via INTRAVENOUS

## 2021-04-26 MED ORDER — ACETAMINOPHEN 10 MG/ML IV SOLN
1000.0000 mg | INTRAVENOUS | Status: AC
  Administered 2021-04-26: 1000 mg via INTRAVENOUS
  Filled 2021-04-26: qty 100

## 2021-04-26 MED ORDER — CHLORHEXIDINE GLUCONATE 0.12 % MT SOLN: 15.0000 mL | Freq: Once | OROMUCOSAL | Status: AC

## 2021-04-26 MED ORDER — HYDROMORPHONE HCL 1 MG/ML IJ SOLN
0.2500 mg | INTRAMUSCULAR | Status: DC | PRN
  Administered 2021-04-26 (×3): 0.25 mg via INTRAVENOUS
  Administered 2021-04-26 (×2): 0.5 mg via INTRAVENOUS
  Administered 2021-04-26: 0.25 mg via INTRAVENOUS

## 2021-04-26 MED ORDER — ALUM & MAG HYDROXIDE-SIMETH 200-200-20 MG/5ML PO SUSP: 30.0000 mL | Freq: Four times a day (QID) | ORAL | Status: DC | PRN

## 2021-04-26 MED ORDER — BUPIVACAINE-EPINEPHRINE 0.5% -1:200000 IJ SOLN
INTRAMUSCULAR | Status: DC | PRN
  Administered 2021-04-26: 4 mL

## 2021-04-26 MED ORDER — DOCUSATE SODIUM 100 MG PO CAPS
100.0000 mg | ORAL_CAPSULE | Freq: Two times a day (BID) | ORAL | Status: DC
  Administered 2021-04-26 – 2021-05-01 (×10): 100 mg via ORAL
  Filled 2021-04-26 (×11): qty 1

## 2021-04-26 MED ORDER — OXYCODONE HCL 5 MG PO TABS
5.0000 mg | ORAL_TABLET | ORAL | Status: DC | PRN
  Administered 2021-04-26 – 2021-04-28 (×8): 5 mg via ORAL
  Filled 2021-04-26 (×8): qty 1

## 2021-04-26 MED ORDER — DOCUSATE SODIUM 100 MG PO CAPS: 100.0000 mg | ORAL_CAPSULE | Freq: Two times a day (BID) | ORAL | 0 refills | Status: DC | PRN

## 2021-04-26 MED ORDER — FENTANYL CITRATE (PF) 250 MCG/5ML IJ SOLN
INTRAMUSCULAR | Status: AC
  Filled 2021-04-26: qty 5

## 2021-04-26 MED ORDER — HYDROCODONE-ACETAMINOPHEN 10-325 MG PO TABS: 1.0000 | ORAL_TABLET | ORAL | Status: DC | PRN

## 2021-04-26 MED ORDER — ROCURONIUM BROMIDE 100 MG/10ML IV SOLN
INTRAVENOUS | Status: DC | PRN
  Administered 2021-04-26: 40 mg via INTRAVENOUS

## 2021-04-26 MED ORDER — FENTANYL CITRATE (PF) 250 MCG/5ML IJ SOLN
INTRAMUSCULAR | Status: DC | PRN
  Administered 2021-04-26: 50 ug via INTRAVENOUS
  Administered 2021-04-26 (×2): 25 ug via INTRAVENOUS

## 2021-04-26 MED ORDER — EPHEDRINE SULFATE-NACL 50-0.9 MG/10ML-% IV SOSY
PREFILLED_SYRINGE | INTRAVENOUS | Status: DC | PRN
  Administered 2021-04-26 (×2): 5 mg via INTRAVENOUS
  Administered 2021-04-26: 10 mg via INTRAVENOUS

## 2021-04-26 MED ORDER — FENTANYL CITRATE (PF) 100 MCG/2ML IJ SOLN
25.0000 ug | INTRAMUSCULAR | Status: DC | PRN
  Administered 2021-04-26 (×2): 25 ug via INTRAVENOUS

## 2021-04-26 MED ORDER — METHOCARBAMOL 500 MG PO TABS
500.0000 mg | ORAL_TABLET | Freq: Four times a day (QID) | ORAL | Status: DC | PRN
  Administered 2021-04-26 – 2021-04-30 (×8): 500 mg via ORAL
  Filled 2021-04-26 (×8): qty 1

## 2021-04-26 MED ORDER — PROPOFOL 10 MG/ML IV BOLUS
INTRAVENOUS | Status: AC
  Filled 2021-04-26: qty 20

## 2021-04-26 MED ORDER — ANASTROZOLE 1 MG PO TABS
1.0000 mg | ORAL_TABLET | Freq: Every day | ORAL | Status: DC
  Administered 2021-04-27 – 2021-05-01 (×5): 1 mg via ORAL
  Filled 2021-04-26 (×5): qty 1

## 2021-04-26 MED ORDER — PHENYLEPHRINE 40 MCG/ML (10ML) SYRINGE FOR IV PUSH (FOR BLOOD PRESSURE SUPPORT)
PREFILLED_SYRINGE | INTRAVENOUS | Status: AC
  Filled 2021-04-26: qty 10

## 2021-04-26 MED ORDER — LEVOTHYROXINE SODIUM 88 MCG PO TABS
88.0000 ug | ORAL_TABLET | Freq: Every day | ORAL | Status: DC
  Administered 2021-04-27 – 2021-05-01 (×5): 88 ug via ORAL
  Filled 2021-04-26 (×5): qty 1

## 2021-04-26 MED ORDER — 0.9 % SODIUM CHLORIDE (POUR BTL) OPTIME
TOPICAL | Status: DC | PRN
  Administered 2021-04-26: 1000 mL

## 2021-04-26 MED ORDER — PHENOL 1.4 % MT LIQD: 1.0000 | OROMUCOSAL | Status: DC | PRN

## 2021-04-26 MED ORDER — PROMETHAZINE HCL 25 MG/ML IJ SOLN: 6.2500 mg | INTRAMUSCULAR | Status: DC | PRN

## 2021-04-26 MED ORDER — ESCITALOPRAM OXALATE 10 MG PO TABS
20.0000 mg | ORAL_TABLET | Freq: Every day | ORAL | Status: DC
  Administered 2021-04-27 – 2021-05-01 (×5): 20 mg via ORAL
  Filled 2021-04-26: qty 2
  Filled 2021-04-26: qty 1
  Filled 2021-04-26 (×2): qty 2
  Filled 2021-04-26: qty 1

## 2021-04-26 SURGICAL SUPPLY — 60 items
BAG COUNTER SPONGE SURGICOUNT (BAG) ×2 IMPLANT
BAG DECANTER FOR FLEXI CONT (MISCELLANEOUS) ×1 IMPLANT
BAG SPNG CNTER NS LX DISP (BAG) ×1
BAND INSRT 18 STRL LF DISP RB (MISCELLANEOUS) ×2
BAND RUBBER #18 3X1/16 STRL (MISCELLANEOUS) ×4 IMPLANT
BUR STRYKR EGG 5.0 (BURR) IMPLANT
CLEANER TIP ELECTROSURG 2X2 (MISCELLANEOUS) ×2 IMPLANT
CNTNR URN SCR LID CUP LEK RST (MISCELLANEOUS) ×1 IMPLANT
CONT SPEC 4OZ STRL OR WHT (MISCELLANEOUS) ×2
DRAPE LAPAROTOMY 100X72X124 (DRAPES) ×2 IMPLANT
DRAPE MICROSCOPE LEICA (MISCELLANEOUS) ×2 IMPLANT
DRAPE SHEET LG 3/4 BI-LAMINATE (DRAPES) ×2 IMPLANT
DRAPE SURG 17X11 SM STRL (DRAPES) ×2 IMPLANT
DRAPE UTILITY XL STRL (DRAPES) ×2 IMPLANT
DRSG AQUACEL AG ADV 3.5X 4 (GAUZE/BANDAGES/DRESSINGS) IMPLANT
DRSG AQUACEL AG ADV 3.5X 6 (GAUZE/BANDAGES/DRESSINGS) ×1 IMPLANT
DRSG OPSITE 4X5.5 SM (GAUZE/BANDAGES/DRESSINGS) ×1 IMPLANT
DRSG TELFA 3X8 NADH (GAUZE/BANDAGES/DRESSINGS) IMPLANT
DURAPREP 26ML APPLICATOR (WOUND CARE) ×2 IMPLANT
DURASEAL SPINE SEALANT 3ML (MISCELLANEOUS) IMPLANT
ELECT BLADE 4.0 EZ CLEAN MEGAD (MISCELLANEOUS) ×2
ELECT REM PT RETURN 9FT ADLT (ELECTROSURGICAL) ×2
ELECTRODE BLDE 4.0 EZ CLN MEGD (MISCELLANEOUS) IMPLANT
ELECTRODE REM PT RTRN 9FT ADLT (ELECTROSURGICAL) ×1 IMPLANT
EVACUATOR 1/8 PVC DRAIN (DRAIN) ×1 IMPLANT
GLOVE SURG POLYISO LF SZ7.5 (GLOVE) ×2 IMPLANT
GLOVE SURG POLYISO LF SZ8 (GLOVE) ×4 IMPLANT
GLOVE SURG UNDER POLY LF SZ7 (GLOVE) ×2 IMPLANT
GOWN STRL REUS W/ TWL LRG LVL3 (GOWN DISPOSABLE) ×1 IMPLANT
GOWN STRL REUS W/ TWL XL LVL3 (GOWN DISPOSABLE) ×1 IMPLANT
GOWN STRL REUS W/TWL LRG LVL3 (GOWN DISPOSABLE) ×2
GOWN STRL REUS W/TWL XL LVL3 (GOWN DISPOSABLE) ×2
IV CATH 14GX2 1/4 (CATHETERS) ×2 IMPLANT
KIT BASIN OR (CUSTOM PROCEDURE TRAY) ×2 IMPLANT
NDL SPNL 18GX3.5 QUINCKE PK (NEEDLE) ×2 IMPLANT
NEEDLE 22X1 1/2 (OR ONLY) (NEEDLE) ×2 IMPLANT
NEEDLE SPNL 18GX3.5 QUINCKE PK (NEEDLE) ×4 IMPLANT
PACK LAMINECTOMY NEURO (CUSTOM PROCEDURE TRAY) ×2 IMPLANT
PAD DRESSING TELFA 3X8 NADH (GAUZE/BANDAGES/DRESSINGS) IMPLANT
PATTIES SURGICAL .25X.25 (GAUZE/BANDAGES/DRESSINGS) ×1 IMPLANT
PATTIES SURGICAL .75X.75 (GAUZE/BANDAGES/DRESSINGS) ×2 IMPLANT
PUTTY BONE DBX 5CC MIX (Putty) ×2 IMPLANT
SPONGE SURGIFOAM ABS GEL 100 (HEMOSTASIS) ×2 IMPLANT
SPONGE T-LAP 4X18 ~~LOC~~+RFID (SPONGE) ×2 IMPLANT
STAPLER VISISTAT (STAPLE) IMPLANT
STRIP CLOSURE SKIN 1/2X4 (GAUZE/BANDAGES/DRESSINGS) ×2 IMPLANT
SUT NURALON 4 0 TR CR/8 (SUTURE) IMPLANT
SUT PROLENE 3 0 PS 2 (SUTURE) IMPLANT
SUT VIC AB 1 CT1 27 (SUTURE)
SUT VIC AB 1 CT1 27XBRD ANTBC (SUTURE) IMPLANT
SUT VIC AB 1 CTB1 27 (SUTURE) ×2 IMPLANT
SUT VIC AB 1-0 CT2 27 (SUTURE) IMPLANT
SUT VIC AB 2-0 CT1 27 (SUTURE) ×4
SUT VIC AB 2-0 CT1 TAPERPNT 27 (SUTURE) IMPLANT
SUT VIC AB 2-0 CT2 27 (SUTURE) IMPLANT
SYR 3ML LL SCALE MARK (SYRINGE) ×2 IMPLANT
TOWEL GREEN STERILE (TOWEL DISPOSABLE) ×2 IMPLANT
TOWEL GREEN STERILE FF (TOWEL DISPOSABLE) ×2 IMPLANT
TRAY FOLEY MTR SLVR 16FR STAT (SET/KITS/TRAYS/PACK) ×2 IMPLANT
YANKAUER SUCT BULB TIP NO VENT (SUCTIONS) ×2 IMPLANT

## 2021-04-26 NOTE — Anesthesia Postprocedure Evaluation (Signed)
Anesthesia Post Note  Patient: Jamie Beltran  Procedure(s) Performed: Microlumbar decompression Lumbar four-five, lateral mass fusion using autologus allograft bone (Back)     Patient location during evaluation: PACU Anesthesia Type: General Level of consciousness: sedated and patient cooperative Pain management: pain level controlled Vital Signs Assessment: post-procedure vital signs reviewed and stable Respiratory status: spontaneous breathing Cardiovascular status: stable Anesthetic complications: no   No notable events documented.  Last Vitals:  Vitals:   04/26/21 1223 04/26/21 1255  BP: (!) 119/53 122/78  Pulse: 88 79  Resp: 11 18  Temp: 36.8 C 36.6 C  SpO2: 93% 96%    Last Pain:  Vitals:   04/26/21 1300  TempSrc:   PainSc: [place]

## 2021-04-26 NOTE — Op Note (Signed)
Jamie Beltran, Jamie Beltran MEDICAL RECORD NO: [id number] ACCOUNT NO: [id number] DATE OF BIRTH: October 11, 1934 FACILITY: [facility] LOCATION: [facility] PHYSICIAN: Johnn Hai, MD  Operative Report   DATE OF PROCEDURE: 04/26/2021  PREOPERATIVE DIAGNOSIS:  Spinal stenosis and spondylolisthesis at L4-L5.  POSTOPERATIVE DIAGNOSIS:  Spinal stenosis and spondylolisthesis at L4-L5.  PROCEDURES PERFORMED: 1.  Microlumbar decompression at L4-L5 with bilateral hemilaminotomies and foraminotomies, L4-L5. 2.  Lateral mass fusion utilizing autologous and allograft bone graft.  ANESTHESIA:  General.  ASSISTANT:  Lacie Draft, PA  HISTORY:  An 86 year old female with severe spinal stenosis, neurogenic claudication in left lower extremity, radicular pain over right, EHL weakness.  She had a grade I spondylolisthesis and significant osteoporosis.  She was indicated for  decompression, lateral mass fusion.  Risks and benefits discussed including bleeding, infection, damage to neurovascular structures, no change in symptoms, worsening symptoms, DVT, PE, anesthetic complications, etc.  DESCRIPTION OF PROCEDURE:  With the patient in supine position, after induction of adequate general anesthesia, vancomycin and gentamicin for antimicrobial prophylaxis, she was placed prone on the Wilson frame.  All bony prominences were well padded.   Foley to gravity.  The lumbar region was prepped and draped in the usual sterile fashion.  Two 18-gauge spinal needles were utilized to localize L4-L5 interspace, confirmed with x-ray.  Incision was made from the spinous process above spinous process of  L4 to below spinous process of L5.  Subcutaneous tissue was dissected.  Electrocautery was utilized to achieve hemostasis.  We then used 0.25% Marcaine with epinephrine in subcutaneous tissue.  Dorsal lumbar fascia was divided in line with skin incision.   Paraspinous muscles elevated from the lamina of L4 and L5.  McCulloch retractor  was placed.  Operating microscope was draped, brought in the surgical field.  Confirmatory radiograph obtained confirming the spinous processes of L4 and L5.  We  skeletonized the spinous processes of L4 and L5.  We used Leksell rongeur to remove those spinous processes.  This bone was saved for further bone grafting.  Next, I performed a hemilaminotomies of the caudad edge of L4 bilaterally and essentially  continuing cephalad to the detachment of the ligamentum flavum, preserving the pars.  I utilized a micro straight curette to detach the ligamentum flavum from cephalad edge of L5.  I removed hypertrophic ligamentum from the interspace, which was severely  compressing the thecal sac.  This was first done centrally cephalad and caudad and then first on the right and then the left.  I also performed hemilaminotomies of the cephalad edge of L5 bilaterally protecting the thecal sac with neuro patties and a  Woodson retractor at all times, not retracting the thecal sac.  I performed foraminotomies of L5.  We undercut the facets bilaterally.  Less than 50% of facet was removed bilaterally.  There was severe compression in the lateral recess.  On the right,  there was a large epidural venous plexus, and we encountered bleeding from this plexus.  We packed this with Gelfoam and a neuro patty.  We then slowly removed that in attempt to isolate the vein and cauterize it.  There was a portion of it we were able  to cauterize.  Another portion continued to have some bleeding with it when the patty was removed.  I therefore repacked it with Gelfoam and neuro patty.  We had decompressed the lateral recesses to the medial border of the pedicle bilaterally.  It was  on the right up into the foramen of L4  after performing foraminotomies of L4 where this epidural venous plexus was encountered.  After full decompression, there was [facility] probe passed freely out the foramen of L4 and L5.  Good restoration of the thecal   sac.  No evidence of CSF leak. There was an extensive epidural venous plexus on the left as well.This did limit full decompression of the lateral recess but only a minor portion remained.This to avoid the bleeding like that was encountered on the right. I placed bone wax in all cancellous surfaces.  We saved all bone from the decompression.  We mixed it with DBX Plus on the back table.  We then proceeded with the lateral mass fusion.  Bilaterally over the facets  preserving the facet capsule, we incised the paraspinous musculature and used bipolar electrocautery to achieve strict hemostasis.  Gained access to the lateral aspect of the facet, the pars, and the transverse processes.  Used a straight curette to  decorticate the lateral aspect of the facet down to the transverse processes.  Then, we packed the lateral mass with the bone graft bilaterally.  We did use 5 mL of the DBX Plus.  Electrocautery was utilized to achieve hemostasis.  No active bleeding was  noted following the fusion into the lateral mass, and the thecal sac had been covered with patties throughout this procedure.  Next, we turned our attention back to the epidural venous plexus on the right.  I copiously irrigated the wound first.  I then  used a very small thrombin-soaked gel pads, and underneath the small patty, we progressively moved the small thrombin-soaked Gelfoam patties underneath the thrombin-soaked Gelfoam squares underneath the neuro patty with a [place]field and then removed the  neuro patty.  No active bleeding was then encountered.  Again, bone wax was placed on the cancellous surfaces.  No active bleeding or CSF leakage was noted.  Copiously irrigated the wound.  I placed a Hemovac through fascia and a stab wound through the  skin on the right and aligned it along the spinous process of L3.  A small square of thrombin-soaked Gelfoam was placed over the lateral recess as well on the right slightly under the remaining lamina, thus  to aid in the hemostasis of this epidural  venous plexus.  I removed the [facility] retractor.  No obvious bleeding.  Copiously irrigated the wound.  I closed the dorsal lumbar fascia with 1 Vicryl interrupted figure-of-eight sutures taking care not to incarcerate the Hemovac, subcutaneous with  2-0, and skin with staples.  The wound was dressed sterilely.  She was placed supine on the hospital bed, extubated without difficulty and transported to the recovery room in satisfactory condition.  The patient tolerated the procedure well.  No complications.  Assistant Lacie Draft, [place].  Blood loss was 50 mL.   [facility] D: 04/26/2021 10:51:43 am T: 04/26/2021 11:40:00 am  JOB: [id number]

## 2021-04-26 NOTE — Progress Notes (Signed)
Orthopedic Tech Progress Note Patient Details:  Jamie Beltran 03/12/1935 [id number] Left Lso back brace in room Patient ID: Jamie Beltran, female   DOB: June 04, 1935, 86 y.o.   MRN: [id number]  Jamie Beltran 04/26/2021, 1:03 PM

## 2021-04-26 NOTE — Progress Notes (Signed)
Pharmacy Antibiotic Note  Jamie Beltran is a 86 y.o. female admitted on 04/26/2021 with surgical prophylaxis.  Pharmacy has been consulted for vancomycin dosing.  Confirmed patient has drain. ClCr ~50 ml/hr. Received 1g vancomycin preop.   Plan: Vancomycin 1g Q24 hr until drain is removed  Height: '5\' 4"'$  (162.6 cm) Weight: 68 kg (149 lb 14.6 oz) IBW/kg (Calculated) : 54.7  Temp (24hrs), Avg:97.9 F (36.6 C), Min:97.6 F (36.4 C), Max:98.2 F (36.8 C)  No results for input(s): WBC, CREATININE, LATICACIDVEN, VANCOTROUGH, VANCOPEAK, VANCORANDOM, GENTTROUGH, GENTPEAK, GENTRANDOM, TOBRATROUGH, TOBRAPEAK, TOBRARND, AMIKACINPEAK, AMIKACINTROU, AMIKACIN in the last 168 hours.  Estimated Creatinine Clearance: 50.5 mL/min (by C-G formula based on SCr of 0.8 mg/dL).    Allergies  Allergen Reactions   Codeine Other (See Comments)    "Makes me crazy."   Penicillins Hives and Swelling    Has patient had a PCN reaction causing immediate rash, facial/tongue/throat swelling, SOB or lightheadedness with hypotension: Yes Has patient had a PCN reaction causing severe rash involving mucus membranes or skin necrosis: No Has patient had a PCN reaction that required hospitalization No Has patient had a PCN reaction occurring within the last 10 years: No If all of the above answers are "NO", then may proceed with Cephalosporin use.    Adhesive [Tape] Itching    Heart monitor pads   Protonix [Pantoprazole Sodium] Diarrhea    With 40 mg twice daily, can tolerate 40 mg once daily     Benetta Spar, PharmD, BCPS, [facility] Clinical Pharmacist  Please check AMION for all [place] phone numbers After 10:00 PM, call [place] [phone]

## 2021-04-26 NOTE — Transfer of Care (Signed)
Immediate Anesthesia Transfer of Care Note  Patient: Jamie Beltran  Procedure(s) Performed: Microlumbar decompression Lumbar four-five, lateral mass fusion using autologus allograft bone (Back)  Patient Location: PACU  Anesthesia Type:General  Level of Consciousness: awake, alert , oriented and patient cooperative  Airway & Oxygen Therapy: Patient Spontanous Breathing and Patient connected to face mask oxygen  Post-op Assessment: Report given to RN, Post -op Vital signs reviewed and stable and Patient moving all extremities  Post vital signs: Reviewed and stable  Last Vitals:  Vitals Value Taken Time  BP 158/62 04/26/21 1053  Temp    Pulse 86 04/26/21 1055  Resp 18 04/26/21 1055  SpO2 94 % 04/26/21 1055  Vitals shown include unvalidated device data.  Last Pain:  Vitals:   04/26/21 0643  TempSrc:   PainSc: 5          Complications: No notable events documented.

## 2021-04-26 NOTE — Brief Op Note (Signed)
04/26/2021  10:40 AM  PATIENT:  Jamie Beltran  86 y.o. female  PRE-OPERATIVE DIAGNOSIS:  Spinal stenosis, spondylolisthesis Lumbar four-five  POST-OPERATIVE DIAGNOSIS:  Spinal stenosis, spondylolisthesis Lumbar four-five  PROCEDURE:  Procedure(s): Microlumbar decompression Lumbar four-five, lateral mass fusion using autologus allograft bone (N/A)  SURGEON:  Surgeon(s) and Role:    Susa Day, MD - Primary  PHYSICIAN ASSISTANT:   ASSISTANTS: [place]   ANESTHESIA:   general  EBL:  50 mL   BLOOD ADMINISTERED:none  DRAINS: Hemovac   LOCAL MEDICATIONS USED:  MARCAINE     SPECIMEN:  No Specimen  DISPOSITION OF SPECIMEN:  N/A  COUNTS:  YES  TOURNIQUET:  * No tourniquets in log *  DICTATION: .Other Dictation: Dictation Number [id number]  PLAN OF CARE: Admit for overnight observation  PATIENT DISPOSITION:  PACU - hemodynamically stable.   Delay start of Pharmacological VTE agent (>24hrs) due to surgical blood loss or risk of bleeding: yes

## 2021-04-26 NOTE — Discharge Instructions (Addendum)
Walk As Tolerated utilizing back precautions.  No bending, twisting, or lifting.  No driving for 2 weeks.   Aquacel dressing may remain in place until follow up. May shower with aquacel dressing in place. If the dressing peels off or becomes saturated, you may remove aquacel dressing and place gauze and tape dressing which should be kept clean and dry and changed daily. Do not remove steri-strips if they are present. See Dr. Tonita Cong in office in 10 to 14 days. Begin taking aspirin '81mg'$  per day starting 4 days after your surgery if not allergic to aspirin or on another blood thinner. Walk daily even outside. Use a cane or walker only if necessary. Avoid sitting on soft sofas.  Begin taking a multivitamin with iron

## 2021-04-26 NOTE — Interval H&P Note (Signed)
History and Physical Interval Note:  04/26/2021 7:14 AM  Jamie Beltran  has presented today for surgery, with the diagnosis of Spinal stenosis, spondylolisthesis L4-5.  The various methods of treatment have been discussed with the patient and family. After consideration of risks, benefits and other options for treatment, the patient has consented to  Procedure(s): Microlumbar decompression L4-5, lateral mass fusion using autologus allograft bone (N/A) as a surgical intervention.  The patient's history has been reviewed, patient examined, no change in status, stable for surgery.  I have reviewed the patient's chart and labs.  Questions were answered to the patient's satisfaction.     Johnn Hai

## 2021-04-26 NOTE — Anesthesia Procedure Notes (Signed)
Procedure Name: Intubation Date/Time: 04/26/2021 7:37 AM Performed by: Myna Bright, CRNA Pre-anesthesia Checklist: Patient identified, Emergency Drugs available, Suction available and Patient being monitored Patient Re-evaluated:Patient Re-evaluated prior to induction Oxygen Delivery Method: Circle system utilized Preoxygenation: Pre-oxygenation with 100% oxygen Induction Type: IV induction Ventilation: Mask ventilation without difficulty Laryngoscope Size: Mac and 4 Grade View: Grade I Tube type: Oral Tube size: 7.0 mm Number of attempts: 1 Airway Equipment and Method: Stylet Placement Confirmation: ETT inserted through vocal cords under direct vision, positive ETCO2 and breath sounds checked- equal and bilateral Secured at: 21 cm Tube secured with: Tape Dental Injury: Teeth and Oropharynx as per pre-operative assessment

## 2021-04-27 ENCOUNTER — Encounter (HOSPITAL_COMMUNITY): Payer: Self-pay | Admitting: Specialist

## 2021-04-27 LAB — BASIC METABOLIC PANEL
Anion gap: 4 — ABNORMAL LOW (ref 5–15)
BUN: 5 mg/dL — ABNORMAL LOW (ref 8–23)
CO2: 29 mmol/L (ref 22–32)
Calcium: 8.7 mg/dL — ABNORMAL LOW (ref 8.9–10.3)
Chloride: 108 mmol/L (ref 98–111)
Creatinine, Ser: 0.76 mg/dL (ref 0.44–1.00)
GFR, Estimated: >60 mL/min (ref >60)
Glucose, Bld: 96 mg/dL (ref 70–99)
Potassium: 4.1 mmol/L (ref 3.5–5.1)
Sodium: 141 mmol/L (ref 135–145)

## 2021-04-27 LAB — CBC
HCT: 29.5 % — ABNORMAL LOW (ref 36.0–46.0)
Hemoglobin: 9.2 g/dL — ABNORMAL LOW (ref 12.0–15.0)
MCH: 28.1 pg (ref 26.0–34.0)
MCHC: 31.2 g/dL (ref 30.0–36.0)
MCV: 90.2 fL (ref 80.0–100.0)
Platelets: 178 10*3/uL (ref 150–400)
RBC: 3.27 MIL/uL — ABNORMAL LOW (ref 3.87–5.11)
RDW: 15.5 % (ref 11.5–15.5)
WBC: 6.1 10*3/uL (ref 4.0–10.5)
nRBC: 0 % (ref 0.0–0.2)

## 2021-04-27 NOTE — TOC Initial Note (Addendum)
Transition of Care [facility]) - Initial/Assessment Note    Patient Details  Name: Jamie Beltran MRN: [id number] Date of Birth: October 16, 1934  [facility]) CM/SW Contact:    Joanne Chars, LCSW Phone Number: 04/27/2021, 11:11 AM  Clinical Narrative:   CSW informed  by RN pt recommended for [facility], CSW spoke to pt by phone due to pt being covid +.  Pt reports she lives alone but her sister will be staying with her for 2 weeks after discharge.  She and her husband have had [facility] in the past and she does not wish to have [facility] at this time.  Pt states she would accept [facility] "if the doctor insists" but her preference is to just do exercises on her own.  RN informed.    1205: CSW informed by RN that pt is now agreeable to [facility] services.  [facility] agrees to accept this referral.                 Expected Discharge Plan: Home/Self Care Barriers to Discharge: No Barriers Identified   Patient Goals and CMS Choice Patient states their goals for this hospitalization and ongoing recovery are:: "be able to take care of my husband" CMS Medicare.gov Compare Post Acute Care list provided to:: Patient Choice offered to / list presented to : Patient  Expected Discharge Plan and Services Expected Discharge Plan: Home/Self Care In-house Referral: Clinical Social Work   Post Acute Care Choice: NA (pt declines HH) Living arrangements for the past 2 months: Single Family Home                 DME Arranged: N/A (DME through [facility] staff)         [place] Arranged: Patient Refused [place]          Prior Living Arrangements/Services Living arrangements for the past 2 months: Single Family Home Lives with:: Self (pt siuster will be staying with her after discharge) Patient language and need for interpreter reviewed:: Yes Do you feel safe going back to the place where you live?: Yes      Need for Family Participation in Patient Care: Yes (Comment) Care giver support system in place?: Yes (comment)  (sister will be in the home) Current home services: Other (comment) (none) Criminal Activity/Legal Involvement Pertinent to Current Situation/Hospitalization: No - Comment as needed  Activities of Daily Living      Permission Sought/Granted                  Emotional Assessment Appearance:: Other (Comment Required (unknown: phone assessment due to pt being covid +) Attitude/Demeanor/Rapport: Engaged Affect (typically observed): Appropriate, Pleasant Orientation: : Oriented to Self, Oriented to Place, Oriented to  Time, Oriented to Situation Alcohol / Substance Use: Not Applicable Psych Involvement: No (comment)  Admission diagnosis:  Spinal stenosis at L4-L5 level [M48.061] Patient Active Problem List   Diagnosis Date Noted   Spinal stenosis at L4-L5 level 04/26/2021   Stage I breast cancer, left ([place]) 10/31/2017   Malignant neoplasm of upper-inner quadrant of left breast in female, estrogen receptor positive ([place]) 10/20/2017   Hypothyroidism 05/29/2016   GERD (gastroesophageal reflux disease) 05/29/2016   Hyperlipidemia 05/29/2016   Migraine 05/29/2016   Pain in the chest 05/28/2016   DVT (deep venous thrombosis) ([place]) 07/14/2014   Back pain 07/14/2014   Vasovagal syncope 05/19/2014   PCP:  Shirline Frees, MD Pharmacy:   [facility]  ([place] - [place], [place] -  [place] [address] Phone: [phone] Fax: [phone]  [place] [address] Phone: [phone] Fax: [phone]  [facility] - [place], [place] - [address] [place] [address] Phone: [phone] Fax: [phone]     Social Determinants of Health (SDOH) Interventions    Readmission Risk Interventions No flowsheet data found.

## 2021-04-27 NOTE — Evaluation (Signed)
Physical Therapy Evaluation Patient Details Name: Jamie Beltran MRN: [id number] DOB: Aug 28, 1935 Today's Date: 04/27/2021   History of Present Illness  The pt is an 86 yo female presenting 8/4 following decompression L4-5 with lateral mass fusion due to ongoing pain. Covid + per pre-op testing 8/1 (originally COVID + July 2, was negative x2 3 weeks later per pt report)--reports she is still weak from [place] prior to surgery.   Clinical Impression  Pt in bed upon arrival of PT, agreeable to evaluation at this time. Prior to admission the pt was mobilizing with use of SPC in her home, and limited to short distances due to onset of dizziness with prolonged activity. The pt now presents with limitations in functional mobility, strength, power, stability, and endurance due to above dx and resulting pain, and will continue to benefit from skilled PT to address these deficits. The pt was able to complete bed mobility with VC and HOB elevated, but continues to require cues for technique and spinal precautions. The pt's OOB mobility was limited by onset of dizziness and pre-syncope with changes in position, as well as x3 instances of LE buckling requiring modA to recover in addition to pain. The pt was limited to 15 ft ambulation in the room, will need max HHPT as well as supervision/assist for all OOB mobility to reduce risk of falls following d/c.      Follow Up Recommendations Home health PT;Supervision for mobility/OOB    Equipment Recommendations  None recommended by PT (pt well equipped)    Recommendations for Other Services       Precautions / Restrictions Precautions Precautions: Back;Fall Precaution Booklet Issued: Yes (comment) Precaution Comments: pt unable to recall despite OT session earlier this morning, reviewed Required Braces or Orthoses: Spinal Brace Spinal Brace: Lumbar corset;Applied in sitting position;Applied in standing position Restrictions Weight Bearing Restrictions: No       Mobility  Bed Mobility Overal bed mobility: Needs Assistance Bed Mobility: Rolling;Sidelying to Sit;Sit to Sidelying Rolling: Min guard Sidelying to sit: Min guard;HOB elevated (20 deg)     Sit to sidelying: Min guard General bed mobility comments: increased time, VC for hand placement, use of bed rails    Transfers Overall transfer level: Needs assistance Equipment used: Rolling walker (2 wheeled) Transfers: Sit to/from Stand Sit to Stand: Mod assist;Min assist         General transfer comment: VC for hand placement, modA to maintain due to knees buckling with initial stand.  Ambulation/Gait Ambulation/Gait assistance: Min assist Gait Distance (Feet): 15 Feet Assistive device: Rolling walker (2 wheeled) Gait Pattern/deviations: Step-to pattern;Decreased stride length;Shuffle;Narrow base of support;Trunk flexed Gait velocity: decreased Gait velocity interpretation: <1.31 ft/sec, indicative of household ambulator General Gait Details: pt with minimal clearance and stride length, x3 instances of knee buckling requiring increased assist to maintain upright. pt unable to tolerate longer ambulation     Balance Overall balance assessment: Needs assistance Sitting-balance support: No upper extremity supported;Feet supported Sitting balance-Leahy Scale: Fair Sitting balance - Comments: limited by pain   Standing balance support: Bilateral upper extremity supported;During functional activity Standing balance-Leahy Scale: Poor Standing balance comment: reliant on BUE support                             Pertinent Vitals/Pain Pain Assessment: Faces Pain Score: 6  Faces Pain Scale: Hurts even more Pain Location: surgical site, L thigh Pain Descriptors / Indicators: Aching;Grimacing;Sore Pain Intervention(s): Limited activity within  patient's tolerance;Monitored during session;Repositioned    Home Living Family/patient expects to be discharged to:: Private  residence Living Arrangements: Alone Available Help at Discharge: Family;Friend(s);Available 24 hours/day Type of Home: House Home Access: Stairs to enter Entrance Stairs-Rails: Psychiatric nurse of Steps: 4 Home Layout: One level Home Equipment: Walker - 4 wheels;Walker - 2 wheels;Cane - single point;Hand held shower head;Shower seat;Wheelchair - manual      Prior Function Level of Independence: Independent         Comments: Husband is in a nursing home, pt reports limited to 30 min standing due to dizziness at home, reports decreased activity since husband has moved to nursing home     Hand Dominance   Dominant Hand: Right    Extremity/Trunk Assessment   Upper Extremity Assessment Upper Extremity Assessment: Defer to OT evaluation;Generalized weakness    Lower Extremity Assessment Lower Extremity Assessment: RLE deficits/detail;LLE deficits/detail RLE Deficits / Details: limited primarily by pain, grossly 3/5 with difficulty completeing full knee extension against gravity due to pain/weakness. RLE: Unable to fully assess due to pain LLE Deficits / Details: limited primarily by pain, grossly 3/5 with difficulty completeing full knee extension against gravity due to pain/weakness. LLE: Unable to fully assess due to pain    Cervical / Trunk Assessment Cervical / Trunk Assessment: Other exceptions Cervical / Trunk Exceptions: s/p lumbar spine surgery  Communication   Communication: No difficulties  Cognition Arousal/Alertness: Awake/alert Behavior During Therapy: WFL for tasks assessed/performed Overall Cognitive Status: Impaired/Different from baseline Area of Impairment: Memory;Following commands;Safety/judgement;Problem solving                     Memory: Decreased recall of precautions;Decreased short-term memory Following Commands: Follows one step commands with increased time Safety/Judgement: Decreased awareness of safety;Decreased  awareness of deficits   Problem Solving: Decreased initiation;Requires verbal cues General Comments: VC for sequencing, safety, and repeated cues for precautions. pt with multiple verbal mistakes during conversation but able to self-correct. needing max cues for self-regulaiton of exertion and fatigue      General Comments General comments (skin integrity, edema, etc.): BP 145/60 (80) in supine, 121/57 (77) in sitting, and 107/52 (68) after ambulation        Assessment/Plan    PT Assessment Patient needs continued PT services  PT Problem List Decreased strength;Decreased activity tolerance;Decreased range of motion;Decreased balance;Decreased mobility;Decreased safety awareness;Decreased knowledge of precautions;Pain       PT Treatment Interventions DME instruction;Gait training;Stair training;Functional mobility training;Therapeutic activities;Balance training;Therapeutic exercise;Patient/family education    PT Goals (Current goals can be found in the Care Plan section)  Acute Rehab PT Goals Patient Stated Goal: to go home PT Goal Formulation: With patient Time For Goal Achievement: 05/11/21 Potential to Achieve Goals: Good    Frequency Min 5X/week   Barriers to discharge Inaccessible home environment pt with 4 steps to enter       AM-PAC PT "6 Clicks" Mobility  Outcome Measure Help needed turning from your back to your side while in a flat bed without using bedrails?: A Little Help needed moving from lying on your back to sitting on the side of a flat bed without using bedrails?: A Little Help needed moving to and from a bed to a chair (including a wheelchair)?: A Little Help needed standing up from a chair using your arms (e.g., wheelchair or bedside chair)?: A Lot Help needed to walk in hospital room?: A Little Help needed climbing 3-5 steps with a railing? : A Lot  6 Click Score: 16    End of Session Equipment Utilized During Treatment: Gait belt;Back brace Activity  Tolerance: Patient limited by fatigue;Patient limited by pain Patient left: in bed;with call bell/phone within reach Nurse Communication: Mobility status (orthostatic) PT Visit Diagnosis: Other abnormalities of gait and mobility (R26.89);Muscle weakness (generalized) (M62.81);Pain Pain - Right/Left: Left Pain - part of body:  (thigh, back)    Time: 1105-1140 PT Time Calculation (min) (ACUTE ONLY): 35 min   Charges:     PT Treatments $Therapeutic Activity: 8-22 mins        Inocencio Homes, PT, DPT   Acute Rehabilitation Department Pager #: [phone]  Otho Bellows 04/27/2021, 11:57 AM

## 2021-04-27 NOTE — Progress Notes (Signed)
Subjective: 1 Day Post-Op Procedure(s) (LRB): Microlumbar decompression Lumbar four-five, lateral mass fusion using autologus allograft bone (N/A) Patient reports pain as moderate.   Leg pain improved. Is concerned about going home because she has steps at home. Tolerating PO without N/V +void +flatus +ambulation  Objective: Vital signs in last 24 hours: Temp:  [97.6 F (36.4 C)-98.6 F (37 C)] 98.6 F (37 C) (08/05 0411) Pulse Rate:  [74-91] 77 (08/05 0411) Resp:  [11-19] 18 (08/05 0411) BP: (114-158)/(53-78) 129/72 (08/05 0411) SpO2:  [92 %-98 %] 96 % (08/05 0411)  Intake/Output from previous day: 08/04 0701 - 08/05 0700 In: 1451.8 [I.V.:1200; IV Piggyback:251.8] Out: 1750 [Urine:1500; Drains:200; Blood:50] Intake/Output this shift: No intake/output data recorded.  Recent Labs    04/27/21 0215  HGB 9.2*   Recent Labs    04/27/21 0215  WBC 6.1  RBC 3.27*  HCT 29.5*  PLT 178   Recent Labs    04/27/21 0215  NA 141  K 4.1  CL 108  CO2 29  BUN 5*  CREATININE 0.76  GLUCOSE 96  CALCIUM 8.7*   No results for input(s): LABPT, INR in the last 72 hours.  Neurologically intact ABD soft Neurovascular intact Sensation intact distally Intact pulses distally Dorsiflexion/Plantar flexion intact Incision: dressing C/D/I and Drain with serousaguinous output   Assessment/Plan: 1 Day Post-Op Procedure(s) (LRB): Microlumbar decompression Lumbar four-five, lateral mass fusion using autologus allograft bone (N/A) Advance diet Up with therapy Will pull drain later this am Encourage IS DVT ppx: Teds, SCDs, ambulation  D/C pending progress with PT   Jamie Beltran 04/27/2021, 7:22 AM

## 2021-04-27 NOTE — Evaluation (Signed)
Occupational Therapy Evaluation Patient Details Name: Jamie Beltran MRN: [id number] DOB: 03/11/1935 Today's Date: 04/27/2021    History of Present Illness Microlumbar decompression Lumbar four-five, lateral mass fusion using autologus allograft bone. Covid + per pre-op testing 8/1 (originally COVID + July 2, was negative x2 3 weeks later per pt report)--reports she is still weak from [place] prior to surgery.   Clinical Impression   This 86 yo female admitted and underwent above presents to acute OT with PLOF of being independent with all basic ADLs and IADLs with some modifications due to back pain but living alone. Currently she is limited by weakness, dizziness, pain, and back precautions making her at a setup/S level-min A level for basic ADLs. She reports she will stay at a friend's house several days and then her sister will be coming into town at which time she will go to her (patient's) house with sister staying with her. She will continue to benefit from acute OT with follow up [place] recommended.    Follow Up Recommendations  Home health OT;Supervision/Assistance - 24 hour    Equipment Recommendations  None recommended by OT       Precautions / Restrictions Precautions Precautions: Back Precaution Booklet Issued: Yes (comment) Precaution Comments: Can have brace off for quick trips to bathroom as well as should not be wearing when in bed. Weakness, dizziness during evaluation Required Braces or Orthoses: Spinal Brace Spinal Brace: Lumbar corset;Applied in sitting position;Applied in standing position Restrictions Weight Bearing Restrictions: No      Mobility Bed Mobility Overal bed mobility: Needs Assistance Bed Mobility: Sidelying to Sit   Sidelying to sit: Min guard;HOB elevated       General bed mobility comments: increased time,use of rail    Transfers Overall transfer level: Needs assistance Equipment used: Rolling walker (2 wheeled) Transfers: Sit to/from  Stand Sit to Stand: Min guard         General transfer comment: VCs for safety with hand placement, educated on wider stance with feet turned out for sit<>stand and keeping back straight    Balance Overall balance assessment: Mild deficits observed, not formally tested                                         ADL either performed or assessed with clinical judgement   ADL Overall ADL's : Needs assistance/impaired Eating/Feeding: Independent;Sitting Eating/Feeding Details (indicate cue type and reason): EOB (did recommended pt wear brace while seated EOB to eat to give her support since she said she Grooming: Wash/dry hands;Oral care;Min guard;Standing   Upper Body Bathing: Set up;Sitting   Lower Body Bathing: Minimal assistance Lower Body Bathing Details (indicate cue type and reason): min guard A sit<>stand Upper Body Dressing : Set up;Sitting   Lower Body Dressing: Minimal assistance Lower Body Dressing Details (indicate cue type and reason): min guard A sit<>stand Toilet Transfer: Min guard;Ambulation;Regular Toilet;Grab bars;RW   Toileting- Water quality scientist and Hygiene: Min guard;Sit to/from stand               Vision Patient Visual Report: No change from baseline              Pertinent Vitals/Pain Pain Assessment: 0-10 Pain Score: 6  Pain Location: surgical site Pain Descriptors / Indicators: Aching;Grimacing;Sore Pain Intervention(s): Limited activity within patient's tolerance;Monitored during session;Repositioned;Ice applied     Hand Dominance Right   Extremity/Trunk Assessment  Upper Extremity Assessment Upper Extremity Assessment: Overall WFL for tasks assessed           Communication Communication Communication: No difficulties   Cognition Arousal/Alertness: Awake/alert Behavior During Therapy: WFL for tasks assessed/performed Overall Cognitive Status: Within Functional Limits for tasks assessed                                                 Home Living Family/patient expects to be discharged to:: Private residence Living Arrangements: Alone Available Help at Discharge: Family;Friend(s);Available 24 hours/day Type of Home: House Home Access: Stairs to enter [employer] of Steps: 4 Entrance Stairs-Rails: Right;Left Home Layout: One level     Bathroom Shower/Tub: Occupational psychologist: Handicapped height     Home Equipment: Environmental consultant - 4 wheels;Walker - 2 wheels;Cane - single point;Hand held shower head;Shower seat          Prior Functioning/Environment Level of Independence: Independent        Comments: Husband is in a nursing home        OT Problem List: Decreased range of motion;Decreased activity tolerance;Impaired balance (sitting and/or standing);Pain;Decreased safety awareness;Decreased knowledge of use of DME or AE;Decreased knowledge of precautions      OT Treatment/Interventions: Self-care/ADL training;DME and/or AE instruction;Patient/family education;Balance training    OT Goals(Current goals can be found in the care plan section) Acute Rehab OT Goals Patient Stated Goal: to go home OT Goal Formulation: With patient Time For Goal Achievement: 05/11/21 Potential to Achieve Goals: Good  OT Frequency: Min 2X/week    AM-PAC OT "6 Clicks" Daily Activity     Outcome Measure Help from another person eating meals?: None Help from another person taking care of personal grooming?: A Little Help from another person toileting, which includes using toliet, bedpan, or urinal?: A Little Help from another person bathing (including washing, rinsing, drying)?: A Little Help from another person to put on and taking off regular upper body clothing?: A Little Help from another person to put on and taking off regular lower body clothing?: A Little 6 Click Score: 19   End of Session Equipment Utilized During Treatment: Back brace Nurse Communication:  Mobility status (information she relayed to me about her COVID status)  Activity Tolerance: Patient limited by pain (limited by dizzness and weakness) Patient left: in bed;with call bell/phone within reach  OT Visit Diagnosis: Unsteadiness on feet (R26.81);Other abnormalities of gait and mobility (R26.89);Pain;Muscle weakness (generalized) (M62.81) Pain - part of body:  (incisional)                Time: [id number] OT Time Calculation (min): 43 min Charges:  OT General Charges $OT Visit: 1 Visit OT Evaluation $OT Eval Moderate Complexity: 1 Mod OT Treatments $Self Care/Home Management : 23-37 mins  [place], OTR/L Acute [employer] Pager [phone] Office [phone]    Almon Register 04/27/2021, 9:55 AM

## 2021-04-27 NOTE — Progress Notes (Signed)
Subjective: 1 Day Post-Op Procedure(s) (LRB): Microlumbar decompression Lumbar four-five, lateral mass fusion using autologus allograft bone (N/A) Patient reports pain as 3 on 0-10 scale.   Denies CP or SOB.  Voiding without difficulty. Positive flatus. Patient reported postoperatively yesterday she had some continued left leg pain that was less than preop.  Now today she has no left leg pain and she developed right lateral leg pain since noon.  She had been laying on her side for a long period of time.  She also has some knee pain long history of osteoarthritis Objective: Vital signs in last 24 hours: Temp:  [97.6 F (36.4 C)-98.6 F (37 C)] 98.4 F (36.9 C) (08/05 1057) Pulse Rate:  [73-82] 73 (08/05 1057) Resp:  [18] 18 (08/05 1057) BP: (114-162)/(57-72) 162/68 (08/05 1057) SpO2:  [95 %-100 %] 100 % (08/05 1057) Minimal serous  drainage in the Hemovac   Intake/Output from previous day: 08/04 0701 - 08/05 0700 In: 1451.8 [I.V.:1200; IV Piggyback:251.8] Out: 1750 [Urine:1500; Drains:200; Blood:50] Intake/Output this shift: Total I/O In: 240 [P.O.:240] Out: 80 [Drains:80]  Recent Labs    04/27/21 0215  HGB 9.2*   Recent Labs    04/27/21 0215  WBC 6.1  RBC 3.27*  HCT 29.5*  PLT 178   Recent Labs    04/27/21 0215  NA 141  K 4.1  CL 108  CO2 29  BUN 5*  CREATININE 0.76  GLUCOSE 96  CALCIUM 8.7*   No results for input(s): LABPT, INR in the last 72 hours.  Neurologically intact ABD soft Neurovascular intact Sensation intact distally Intact pulses distally No cellulitis present No evidence of DVT. She has not tenderness to medial joint line of the right knee she has mild effusion patellofemoral pain compression.  No cellulitis.  Assessment/Plan:  1 Day Post-Op Procedure(s) (LRB): Microlumbar decompression Lumbar four-five, lateral mass fusion using autologus allograft bone (N/A) Currently left shift minimal drainage  Advance diet Up with therapy Plan  for discharge tomorrow Patient at this point time is not ready to go home.  We will discontinue her drain.  And if it confirms less than 30cc discontinue it. Avoid laying on her side.  That might have aggravated her right-sided pain.  Home health.  H&H in a.m. Discharge to home with home health if she is doing well tomorrow.   Active Problems:   Spinal stenosis at L4-L5 level      Johnn Hai 04/27/2021, '@NOW'$ 

## 2021-04-28 DIAGNOSIS — M4316 Spondylolisthesis, lumbar region: Secondary | ICD-10-CM | POA: Diagnosis present

## 2021-04-28 DIAGNOSIS — R42 Dizziness and giddiness: Secondary | ICD-10-CM | POA: Diagnosis not present

## 2021-04-28 DIAGNOSIS — M549 Dorsalgia, unspecified: Secondary | ICD-10-CM | POA: Diagnosis present

## 2021-04-28 DIAGNOSIS — Z87442 Personal history of urinary calculi: Secondary | ICD-10-CM | POA: Diagnosis not present

## 2021-04-28 DIAGNOSIS — Z20822 Contact with and (suspected) exposure to covid-19: Secondary | ICD-10-CM | POA: Diagnosis present

## 2021-04-28 DIAGNOSIS — M810 Age-related osteoporosis without current pathological fracture: Secondary | ICD-10-CM | POA: Diagnosis present

## 2021-04-28 DIAGNOSIS — M48062 Spinal stenosis, lumbar region with neurogenic claudication: Secondary | ICD-10-CM | POA: Diagnosis present

## 2021-04-28 DIAGNOSIS — Z853 Personal history of malignant neoplasm of breast: Secondary | ICD-10-CM | POA: Diagnosis not present

## 2021-04-28 DIAGNOSIS — G8929 Other chronic pain: Secondary | ICD-10-CM | POA: Diagnosis present

## 2021-04-28 DIAGNOSIS — Z801 Family history of malignant neoplasm of trachea, bronchus and lung: Secondary | ICD-10-CM | POA: Diagnosis not present

## 2021-04-28 DIAGNOSIS — E785 Hyperlipidemia, unspecified: Secondary | ICD-10-CM | POA: Diagnosis present

## 2021-04-28 DIAGNOSIS — Z8249 Family history of ischemic heart disease and other diseases of the circulatory system: Secondary | ICD-10-CM | POA: Diagnosis not present

## 2021-04-28 DIAGNOSIS — Z86718 Personal history of other venous thrombosis and embolism: Secondary | ICD-10-CM | POA: Diagnosis not present

## 2021-04-28 DIAGNOSIS — Z8719 Personal history of other diseases of the digestive system: Secondary | ICD-10-CM | POA: Diagnosis not present

## 2021-04-28 DIAGNOSIS — R5383 Other fatigue: Secondary | ICD-10-CM | POA: Diagnosis not present

## 2021-04-28 DIAGNOSIS — Z8616 Personal history of COVID-19: Secondary | ICD-10-CM | POA: Diagnosis not present

## 2021-04-28 DIAGNOSIS — Z85828 Personal history of other malignant neoplasm of skin: Secondary | ICD-10-CM | POA: Diagnosis not present

## 2021-04-28 DIAGNOSIS — Z87891 Personal history of nicotine dependence: Secondary | ICD-10-CM | POA: Diagnosis not present

## 2021-04-28 DIAGNOSIS — Z9012 Acquired absence of left breast and nipple: Secondary | ICD-10-CM | POA: Diagnosis not present

## 2021-04-28 DIAGNOSIS — M4726 Other spondylosis with radiculopathy, lumbar region: Secondary | ICD-10-CM | POA: Diagnosis present

## 2021-04-28 DIAGNOSIS — E039 Hypothyroidism, unspecified: Secondary | ICD-10-CM | POA: Diagnosis present

## 2021-04-28 DIAGNOSIS — Z803 Family history of malignant neoplasm of breast: Secondary | ICD-10-CM | POA: Diagnosis not present

## 2021-04-28 DIAGNOSIS — K219 Gastro-esophageal reflux disease without esophagitis: Secondary | ICD-10-CM | POA: Diagnosis present

## 2021-04-28 DIAGNOSIS — Z9049 Acquired absence of other specified parts of digestive tract: Secondary | ICD-10-CM | POA: Diagnosis not present

## 2021-04-28 LAB — CBC
HCT: 29.7 % — ABNORMAL LOW (ref 36.0–46.0)
Hemoglobin: 9.5 g/dL — ABNORMAL LOW (ref 12.0–15.0)
MCH: 28.2 pg (ref 26.0–34.0)
MCHC: 32 g/dL (ref 30.0–36.0)
MCV: 88.1 fL (ref 80.0–100.0)
Platelets: 172 10*3/uL (ref 150–400)
RBC: 3.37 MIL/uL — ABNORMAL LOW (ref 3.87–5.11)
RDW: 15.5 % (ref 11.5–15.5)
WBC: 9.3 10*3/uL (ref 4.0–10.5)
nRBC: 0 % (ref 0.0–0.2)

## 2021-04-28 MED ORDER — DEXAMETHASONE SODIUM PHOSPHATE 10 MG/ML IJ SOLN
6.0000 mg | Freq: Four times a day (QID) | INTRAMUSCULAR | Status: AC
  Administered 2021-04-28 – 2021-04-29 (×4): 6 mg via INTRAVENOUS
  Filled 2021-04-28 (×4): qty 1

## 2021-04-28 MED ORDER — HYDROCODONE-ACETAMINOPHEN 10-325 MG PO TABS
1.0000 | ORAL_TABLET | ORAL | Status: DC | PRN
  Administered 2021-04-28: 2 via ORAL
  Administered 2021-04-28 – 2021-04-30 (×5): 1 via ORAL
  Filled 2021-04-28 (×5): qty 1
  Filled 2021-04-28: qty 2

## 2021-04-28 NOTE — Progress Notes (Signed)
    Subjective: Procedure(s) (LRB): Microlumbar decompression Lumbar four-five, lateral mass fusion using autologus allograft bone (N/A) 2 Days Post-Op  Patient reports pain as 7 on 0-10 scale.  Reports increased right gluteal and leg pain reports moderate incisional back pain   Positive void Positive bowel movement Positive flatus Negative chest pain or shortness of breath  Objective: Vital signs in last 24 hours: Temp:  [98.1 F (36.7 C)-98.9 F (37.2 C)] 98.1 F (36.7 C) (08/06 0721) Pulse Rate:  [69-81] 75 (08/06 0848) Resp:  [18] 18 (08/06 0721) BP: (108-162)/(54-68) 146/68 (08/06 0848) SpO2:  [96 %-100 %] 99 % (08/06 0848)  Intake/Output from previous day: 08/05 0701 - 08/06 0700 In: 620 [P.O.:420; IV Piggyback:200] Out: 115 [Drains:115]  Labs: Recent Labs    04/27/21 0215 04/28/21 0514  WBC 6.1 9.3  RBC 3.27* 3.37*  HCT 29.5* 29.7*  PLT 178 172   Recent Labs    04/27/21 0215  NA 141  K 4.1  CL 108  CO2 29  BUN 5*  CREATININE 0.76  GLUCOSE 96  CALCIUM 8.7*   No results for input(s): LABPT, INR in the last 72 hours.  Physical Exam: Neurologically intact Neurovascular intact Sensation intact distally Dorsiflexion/Plantar flexion intact Incision: dressing C/D/I Compartment soft Body mass index is 25.73 kg/m.   Assessment/Plan: Patient stable  xrays n/a Continue mobilization with physical therapy Continue care  1.  Patient complaining of severe right radicular leg pain starting at gluteal region and radiating down the leg.  Pain is quite severe, but she does not appear to have neurological deficits. 2.  Recommend IV steroids: Decadron 6 mg IV every 6 x24 hours. 3.  If the patient continues to have severe debilitating radicular leg pain and I would recommend a follow-up MRI. 4.  We will transfer patient today since the [facility] floor is closing. 5.  I will update Dr. Tonita Cong as to her progress.  Melina Schools, MD [facility] [phone]

## 2021-04-28 NOTE — Progress Notes (Signed)
Patient was transferred to 5N01 via bed by RN and NT as per MD's order because the unit is closing. Patient in no acute distress nor complaints of pain nor discomfort while being transferred. Report given to receiving 5N RN and informed about the plan of care for patient.

## 2021-04-28 NOTE — Progress Notes (Signed)
Occupational Therapy Treatment Patient Details Name: Jamie Beltran MRN: [id number] DOB: 17-Oct-1934 Today's Date: 04/28/2021    History of present illness The pt is an 86 yo female presenting 8/4 following decompression L4-5 with lateral mass fusion due to ongoing pain. Covid + per pre-op testing 8/1 (originally COVID + July 2, was negative x2 3 weeks later per pt report)--reports she is still weak from [place] prior to surgery.   OT comments  Pt seen in conjunction with PT due to pt with increased pain this date.  She is able to perform functional transfers with min guard asssist - min A +2 for safety, but Is unable to attempt LB ADLs due to pain.  Continue to recommend [place], however, if pt does not progress over the weekend, may need to consider SNF level rehab.   Follow Up Recommendations  Home health OT;Supervision/Assistance - 24 hour    Equipment Recommendations  None recommended by OT    Recommendations for Other Services      Precautions / Restrictions Precautions Precautions: Back;Fall Precaution Booklet Issued: Yes (comment) Precaution Comments: Pt able to recall back precautions Required Braces or Orthoses: Spinal Brace Spinal Brace: Lumbar corset;Applied in sitting position;Applied in standing position       Mobility Bed Mobility Overal bed mobility: Needs Assistance Bed Mobility: Rolling Rolling: Min guard Sidelying to sit: Min assist;+2 for safety/equipment       General bed mobility comments: Pt unable to roll to Rt due to pain.  She was able to roll to the Lt and required min A to push up into sitting    Transfers                      Balance Overall balance assessment: Needs assistance Sitting-balance support: Single extremity supported Sitting balance-Leahy Scale: Fair Sitting balance - Comments: requires UE support   Standing balance support: No upper extremity supported;During functional activity;Single extremity supported Standing  balance-Leahy Scale: Poor Standing balance comment: She is able to stand very briefly without UE support, but requires UE support to maintain balance                           ADL either performed or assessed with clinical judgement   ADL Overall ADL's : Needs assistance/impaired                         Toilet Transfer: Min guard;+2 for safety/equipment;Ambulation;Comfort height toilet;Grab bars;RW   Toileting- Water quality scientist and Hygiene: Min guard;Sit to/from stand         General ADL Comments: Pt moves slowly and is very guarded.  She required a seated rest break while ambulating to BR due to c/o dizziness.  SBP 140s     Vision       Perception     Praxis      Cognition Arousal/Alertness: Awake/alert Behavior During Therapy: WFL for tasks assessed/performed Overall Cognitive Status: Impaired/Different from baseline Area of Impairment: Attention;Following commands                               General Comments: Pt easily distracted and requires cues to focus on activity and for sequencing.  Pain seems to be causing signficant internal distraction.  Pt is oriented        Exercises     Shoulder Instructions  General Comments      Pertinent Vitals/ Pain       Pain Assessment: 0-10 Pain Score: 9  Pain Location: Rt hip radiating down her leg - RN and MD aware Pain Descriptors / Indicators: Grimacing;Guarding;Restless;Moaning;Sharp;Shooting;Throbbing Pain Intervention(s): Monitored during session;Limited activity within patient's tolerance;Repositioned;Premedicated before session;Ice applied  Home Living                                          Prior Functioning/Environment              Frequency  Min 2X/week        Progress Toward Goals  OT Goals(current goals can now be found in the care plan section)  Progress towards OT goals: Not progressing toward goals - comment (due to increased  pain)  Acute Rehab OT Goals Patient Stated Goal: to have less pain  Plan Discharge plan remains appropriate    Co-evaluation    PT/OT/SLP Co-Evaluation/Treatment: Yes Reason for Co-Treatment: For patient/therapist safety;To address functional/ADL transfers   OT goals addressed during session: ADL's and self-care      AM-PAC OT "6 Clicks" Daily Activity     Outcome Measure   Help from another person eating meals?: None Help from another person taking care of personal grooming?: A Little Help from another person toileting, which includes using toliet, bedpan, or urinal?: A Little Help from another person bathing (including washing, rinsing, drying)?: A Lot Help from another person to put on and taking off regular upper body clothing?: A Lot Help from another person to put on and taking off regular lower body clothing?: A Lot 6 Click Score: 16    End of Session Equipment Utilized During Treatment: Back brace  OT Visit Diagnosis: Unsteadiness on feet (R26.81);Other abnormalities of gait and mobility (R26.89);Pain;Muscle weakness (generalized) (M62.81) Pain - Right/Left: Right Pain - part of body: Hip;Leg   Activity Tolerance Patient limited by pain   Patient Left in chair;with call bell/phone within reach   Nurse Communication Mobility status        Time: 0826-0905 OT Time Calculation (min): 39 min  Charges: OT General Charges $OT Visit: 1 Visit OT Treatments $Self Care/Home Management : 8-22 mins $Therapeutic Activity: 8-22 mins  Nilsa Nutting., OTR/L Acute Rehabilitation Services Pager [phone] Office [phone]    Lucille Passy M 04/28/2021, 9:48 AM

## 2021-04-28 NOTE — Progress Notes (Signed)
Physical Therapy Treatment Patient Details Name: Jamie Beltran MRN: [id number] DOB: 07/07/1935 Today's Date: 04/28/2021    History of Present Illness The pt is an 86 yo female presenting 8/4 following decompression L4-5 with lateral mass fusion due to ongoing pain. Covid + per pre-op testing 8/1 (originally COVID + July 2, was negative x2 3 weeks later per pt report)--reports she is still weak from [place] prior to surgery.    PT Comments    Pt progressing slowly towards physical therapy goals. Pain limiting tolerance for functional mobility, but was overall willing to work with therapies. Seen in conjunction with OT due to pain levels and to maximize functional potential and safety. Anticipate pt will progress well with mobility as pain improves and she will be safe to discharge home with family support (24 hour recommended initially). If pt does not progress over the weekend, may want to consider a higher level of care. Will continue to follow.   Follow Up Recommendations  Home health PT;Supervision/Assistance - 24 hour     Equipment Recommendations  None recommended by PT (pt well equipped)    Recommendations for Other Services       Precautions / Restrictions Precautions Precautions: Back;Fall Precaution Booklet Issued: Yes (comment) Precaution Comments: Pt able to recall back precautions Required Braces or Orthoses: Spinal Brace Spinal Brace: Lumbar corset;Applied in sitting position;Applied in standing position Restrictions Weight Bearing Restrictions: No    Mobility  Bed Mobility Overal bed mobility: Needs Assistance Bed Mobility: Rolling Rolling: Min guard Sidelying to sit: Min assist;+2 for safety/equipment       General bed mobility comments: Pt unable to roll to Rt due to pain.  She was able to roll to the Lt and required min A to push up into sitting    Transfers Overall transfer level: Needs assistance Equipment used: Rolling walker (2 wheeled) Transfers:  Sit to/from Stand Sit to Stand: Min assist         General transfer comment: VC's for hand placement on seated surface for safety. Min assist required to power-up to full stand and gain/maintain balance. Increased time due to pain.  Ambulation/Gait Ambulation/Gait assistance: Min assist Gait Distance (Feet): 25 Feet Assistive device: Rolling walker (2 wheeled) Gait Pattern/deviations: Step-to pattern;Decreased stride length;Shuffle;Narrow base of support;Trunk flexed Gait velocity: decreased Gait velocity interpretation: <1.31 ft/sec, indicative of household ambulator General Gait Details: Pt able to tolerate in room only. Seated rest break due to dizziness and closing eyes. BP during this time 146/68. +2 for safety helpful but once she began ambulating, pt grossly at a +1 level.   Stairs             Wheelchair Mobility    Modified Rankin (Stroke Patients Only)       Balance Overall balance assessment: Needs assistance Sitting-balance support: Single extremity supported Sitting balance-Leahy Scale: Fair Sitting balance - Comments: requires UE support   Standing balance support: No upper extremity supported;During functional activity;Single extremity supported Standing balance-Leahy Scale: Poor Standing balance comment: She is able to stand very briefly without UE support, but requires UE support to maintain balance                            Cognition Arousal/Alertness: Awake/alert Behavior During Therapy: WFL for tasks assessed/performed Overall Cognitive Status: Impaired/Different from baseline Area of Impairment: Attention;Following commands  General Comments: Pt easily distracted and requires cues to focus on activity and for sequencing.  Pain seems to be causing signficant internal distraction.  Pt is oriented      Exercises      General Comments        Pertinent Vitals/Pain Pain Assessment:  Faces Faces Pain Scale: Hurts whole lot Pain Location: Rt hip and groin radiating down her leg - RN and MD aware Pain Descriptors / Indicators: Grimacing;Guarding;Restless;Moaning;Sharp;Shooting;Throbbing Pain Intervention(s): Limited activity within patient's tolerance;Monitored during session;Repositioned    Home Living                      Prior Function            PT Goals (current goals can now be found in the care plan section) Acute Rehab PT Goals Patient Stated Goal: to have less pain PT Goal Formulation: With patient Time For Goal Achievement: 05/11/21 Potential to Achieve Goals: Good Progress towards PT goals: Progressing toward goals    Frequency    Min 5X/week      PT Plan Current plan remains appropriate    Co-evaluation PT/OT/SLP Co-Evaluation/Treatment: Yes Reason for Co-Treatment: To address functional/ADL transfers;For patient/therapist safety PT goals addressed during session: Mobility/safety with mobility;Balance;Proper use of DME        AM-PAC PT "6 Clicks" Mobility   Outcome Measure  Help needed turning from your back to your side while in a flat bed without using bedrails?: A Little Help needed moving from lying on your back to sitting on the side of a flat bed without using bedrails?: A Little Help needed moving to and from a bed to a chair (including a wheelchair)?: A Little Help needed standing up from a chair using your arms (e.g., wheelchair or bedside chair)?: A Lot Help needed to walk in hospital room?: A Little Help needed climbing 3-5 steps with a railing? : A Lot 6 Click Score: 16    End of Session Equipment Utilized During Treatment: Gait belt;Back brace Activity Tolerance: Patient limited by fatigue;Patient limited by pain Patient left: in bed;with call bell/phone within reach Nurse Communication: Mobility status (orthostatic) PT Visit Diagnosis: Other abnormalities of gait and mobility (R26.89);Muscle weakness  (generalized) (M62.81);Pain Pain - Right/Left: Left Pain - part of body:  (thigh, back)     Time: [id number] PT Time Calculation (min) (ACUTE ONLY): 32 min  Charges:  $Gait Training: 8-22 mins                     Rolinda Roan, PT, DPT Acute Rehabilitation Services Pager: [phone] Office: [phone]    Thelma Comp 04/28/2021, 1:22 PM

## 2021-04-28 NOTE — Plan of Care (Signed)

## 2021-04-29 NOTE — Progress Notes (Signed)
Subjective: 3 Days Post-Op Procedure(s) (LRB): Microlumbar decompression Lumbar four-five, lateral mass fusion using autologus allograft bone (N/A) Patient reports pain as  much improved from yesterday. No longer having the pain radiating down her RLE. Does have some pain in right hip posteriorly but that has also improved .    Objective: Vital signs in last 24 hours: Temp:  [98.1 F (36.7 C)-98.6 F (37 C)] 98.6 F (37 C) (08/07 0754) Pulse Rate:  [74-80] 80 (08/07 0754) Resp:  [15-18] 18 (08/07 0754) BP: (113-142)/(62-77) 130/67 (08/07 0754) SpO2:  [97 %-100 %] 100 % (08/07 0754) Weight:  [68 kg] 68 kg (08/06 2016)  Intake/Output from previous day: 08/06 0701 - 08/07 0700 In: 145 [P.O.:145] Out: -  Intake/Output this shift: Total I/O In: 240 [P.O.:240] Out: -   Recent Labs    04/27/21 0215 04/28/21 0514  HGB 9.2* 9.5*   Recent Labs    04/27/21 0215 04/28/21 0514  WBC 6.1 9.3  RBC 3.27* 3.37*  HCT 29.5* 29.7*  PLT 178 172   Recent Labs    04/27/21 0215  NA 141  K 4.1  CL 108  CO2 29  BUN 5*  CREATININE 0.76  GLUCOSE 96  CALCIUM 8.7*   No results for input(s): LABPT, INR in the last 72 hours.  Dorsiflexion/Plantar flexion intact Sitting up on side of bed and ready to ambulate with PT   Assessment/Plan: 3 Days Post-Op Procedure(s) (LRB): Microlumbar decompression Lumbar four-five, lateral mass fusion using autologus allograft bone (N/A) Up with therapy Much improved today and no need for further imaging/intervention at this time Possible discharge tomorrow if she continues to improve    Jamie Beltran 04/29/2021, 10:58 AM

## 2021-04-29 NOTE — Progress Notes (Signed)
Physical Therapy Treatment Patient Details Name: Jamie Beltran MRN: [id number] DOB: May 18, 1935 Today's Date: 04/29/2021    History of Present Illness The pt is an 86 yo female presenting 8/4 following decompression L4-5 with lateral mass fusion due to ongoing pain. Covid + per pre-op testing 8/1 (originally COVID + July 2, was negative x2 3 weeks later per pt report; Off [facility])--reports she is still weak from [place] prior to surgery.    PT Comments    Continuing work on functional mobility and activity tolerance;  Pt's pain was much more under control, and she was able to roll, get up to sit, stand, and walk the hallway today with close guard for safety, but no need for physical assist;   After going up and down stairs and walking about 120 ft, pt reported worsening lightheadedness, and we opted to sit and roll back to her room; she told me the lightheadedness happens often in her daily life, and she sits down until the feeling passes; she describes first feeling lightheaded, then clammy, then shaky, and finally "seeing stars" before she "passes out"; Her BPs showed a 30 mmHg drop between sitting and standing; While the drop is concerning, it does sound like she typically experiences it, and describes a safe way to manage; Discussed with her primary RN; This orthostasis seems to be her baseline -- Recommend pt address it with her PCP soon after DC (see below for BPs)  Follow Up Recommendations  Home health PT;Supervision for mobility/OOB Get appt with PCP for further evaluation of postural hypotension     Equipment Recommendations  None recommended by PT (pt well equipped; perhaps she could use a home sphygmomanometer)    Recommendations for Other Services       Precautions / Restrictions Precautions Precautions: Back;Fall Precaution Comments: Pt able to recall back precautions Required Braces or Orthoses: Spinal Brace Spinal Brace: Lumbar corset;Applied in sitting  position;Applied in standing position Restrictions Weight Bearing Restrictions: No    Mobility  Bed Mobility Overal bed mobility: Needs Assistance Bed Mobility: Rolling Rolling: Min guard Sidelying to sit: Min guard (with physical contact)       General bed mobility comments: Heavy cues for technique; minguard for safety, and needed manual cueing at times, but no physical assist    Transfers Overall transfer level: Needs assistance Equipment used: Rolling walker (2 wheeled) Transfers: Sit to/from Stand Sit to Stand: Min guard         General transfer comment: Cues for hand placement and safety; slow rise  Ambulation/Gait Ambulation/Gait assistance: Min guard (with and without physical contact) Gait Distance (Feet): 150 Feet Assistive device: Rolling walker (2 wheeled) Gait Pattern/deviations: Step-through pattern;Decreased step length - right;Decreased step length - left     General Gait Details: Opted to pull the recliner behind pt to boost her confidence with progressing amb distance; Pt showed good self-monitor during amb, and after about 120 ft reported "swimmy headedness" and "lightheadedness"; opted to sit at that time, and rolled back to her room; got sitting and standing BPs after that, see vitals flowsheet   Stairs Stairs: Yes Stairs assistance: Min guard Stair Management: One rail Right;Sideways;Step to pattern Number of Stairs: 3 General stair comments: Cues for sequence; good technqiue   Wheelchair Mobility    Modified Rankin (Stroke Patients Only)       Balance     Sitting balance-Leahy Scale:  (approaching Good)       Standing balance-Leahy Scale: Poor  Cognition Arousal/Alertness: Awake/alert Behavior During Therapy: WFL for tasks assessed/performed Overall Cognitive Status: Within Functional Limits for tasks assessed (for simple mobility tasks)                                         Exercises      General Comments General comments (skin integrity, edema, etc.):   04/29/21 1119  Orthostatic Sitting  BP- Sitting 136/85  Pulse- Sitting 78  Orthostatic Standing at 0 minutes  BP- Standing at 0 minutes 106/54 (mildly lightheaded, slightly shaky)  Pulse- Standing at 0 minutes 85  Orthostatic Standing at 3 minutes  BP- Standing at 3 minutes 100/56 (considerably lightheaded, clammy, unable to control shaking)  Pulse- Standing at 3 minutes 85        Pertinent Vitals/Pain Pain Assessment: Faces Faces Pain Scale: Hurts little more Pain Location: Rt hip and groin radiating down her leg (imporved from last session Pain Descriptors / Indicators: Grimacing;Guarding Pain Intervention(s): Monitored during session;Premedicated before session    Home Living                      Prior Function            PT Goals (current goals can now be found in the care plan section) Acute Rehab PT Goals Patient Stated Goal: to have less pain PT Goal Formulation: With patient Time For Goal Achievement: 05/11/21 Potential to Achieve Goals: Good Progress towards PT goals: Progressing toward goals    Frequency    Min 5X/week      PT Plan Current plan remains appropriate    Co-evaluation              AM-PAC PT "6 Clicks" Mobility   Outcome Measure  Help needed turning from your back to your side while in a flat bed without using bedrails?: A Little Help needed moving from lying on your back to sitting on the side of a flat bed without using bedrails?: A Little Help needed moving to and from a bed to a chair (including a wheelchair)?: A Little Help needed standing up from a chair using your arms (e.g., wheelchair or bedside chair)?: A Little Help needed to walk in hospital room?: A Little Help needed climbing 3-5 steps with a railing? : A Little 6 Click Score: 18    End of Session Equipment Utilized During Treatment: Gait belt;Back brace Activity  Tolerance: Patient tolerated treatment well;Other (comment) (improving, but very lightheaded with progressive amb) Patient left: in chair;with call bell/phone within reach Nurse Communication: Mobility status;Other (comment) (Need for restroom) PT Visit Diagnosis: Other abnormalities of gait and mobility (R26.89);Muscle weakness (generalized) (M62.81);Pain Pain - Right/Left: Right Pain - part of body:  (hip, back)     Time: [id number] PT Time Calculation (min) (ACUTE ONLY): 34 min  Charges:  $Gait Training: 8-22 mins $Therapeutic Activity: 8-22 mins                     Roney Marion, PT  Acute Rehabilitation Services Pager [phone] Office [place] 04/29/2021, 11:40 AM

## 2021-04-29 NOTE — Progress Notes (Signed)
Occupational Therapy Treatment Patient Details Name: Jamie Beltran MRN: [id number] DOB: 1935/04/30 Today's Date: 04/29/2021    History of present illness The pt is an 86 yo female presenting 8/4 following decompression L4-5 with lateral mass fusion due to ongoing pain. Covid + per pre-op testing 8/1 (originally COVID + July 2, was negative x2 3 weeks later per pt report; Off [facility])--reports she is still weak from [place] prior to surgery.   OT comments  Pt making good progress with OT goals this session. She completed bed mobility with supervision for safety, functional mobility with min guard, and all ADL's with supervision to min guard for safety. When returning to bed, pt became dizzy, then started to space out, finally she was able to tell OT that her ears felt funny. As pt sat down her respiratory rate increased and she became clammy. RN notified. OT will continue to follow acutely.    Follow Up Recommendations  Home health OT;Supervision/Assistance - 24 hour    Equipment Recommendations  None recommended by OT    Recommendations for Other Services      Precautions / Restrictions Precautions Precautions: Back;Fall Precaution Comments: Pt able to recall back precautions Required Braces or Orthoses: Spinal Brace Spinal Brace: Lumbar corset;Applied in sitting position;Applied in standing position Restrictions Weight Bearing Restrictions: No       Mobility Bed Mobility Overal bed mobility: Needs Assistance Bed Mobility: Rolling;Sidelying to Sit;Sit to Sidelying Rolling: Supervision Sidelying to sit: Supervision     Sit to sidelying: Supervision General bed mobility comments: Using bed rails with HOB elevated    Transfers Overall transfer level: Needs assistance Equipment used: Rolling walker (2 wheeled) Transfers: Sit to/from Stand Sit to Stand: Min guard         General transfer comment: Slow to rise, cautious    Balance Overall balance assessment: Needs  assistance Sitting-balance support: No upper extremity supported;Feet supported Sitting balance-Leahy Scale: Fair     Standing balance support: Bilateral upper extremity supported Standing balance-Leahy Scale: Poor Standing balance comment: reliant on BUE supported                           ADL either performed or assessed with clinical judgement   ADL Overall ADL's : Needs assistance/impaired     Grooming: Wash/dry hands;Supervision/safety;Standing Grooming Details (indicate cue type and reason): completed at sink                 Toilet Transfer: Min guard;Ambulation   Toileting- Clothing Manipulation and Hygiene: Supervision/safety;Sit to/from stand;Sitting/lateral lean       Functional mobility during ADLs: Min guard;Rolling walker General ADL Comments: Pt moving slowly and cautiously, becoming dizzy and clammy after toileting, when ambulating back to bed. Once seated pt required education on pursed lip breathing due to elevated respiratory rate     Vision       Perception     Praxis      Cognition Arousal/Alertness: Awake/alert Behavior During Therapy: WFL for tasks assessed/performed Overall Cognitive Status: Within Functional Limits for tasks assessed                                          Exercises     Shoulder Instructions       General Comments Pt became dizzy, clammy, and distracted after toileting, worried she was going to pass  out. returned to supine in bed, RN notified.    Pertinent Vitals/ Pain       Pain Assessment: Faces Faces Pain Scale: Hurts little more Pain Location: Low back and right hip Pain Descriptors / Indicators: Grimacing;Guarding Pain Intervention(s): Limited activity within patient's tolerance;Monitored during session;Repositioned  Home Living                                          Prior Functioning/Environment              Frequency  Min 2X/week         Progress Toward Goals  OT Goals(current goals can now be found in the care plan section)  Progress towards OT goals: Progressing toward goals  Acute Rehab OT Goals Patient Stated Goal: To feel safe going home OT Goal Formulation: With patient Time For Goal Achievement: 05/11/21 Potential to Achieve Goals: Good ADL Goals Pt Will Perform Grooming: with modified independence;standing Pt Will Perform Upper Body Bathing: sitting;with modified independence Pt Will Perform Lower Body Bathing: with modified independence;sit to/from stand;with adaptive equipment Pt Will Perform Upper Body Dressing: sitting;with modified independence Pt Will Perform Lower Body Dressing: with modified independence;with adaptive equipment;sit to/from stand Pt Will Transfer to Toilet: with modified independence;ambulating;bedside commode Pt Will Perform Toileting - Clothing Manipulation and hygiene: with modified independence;sit to/from stand Additional ADL Goal #1: Pt will be Mod I in and OOB for basic ADLs (HOB flat, no rail, increased time)  Plan Discharge plan remains appropriate;Frequency remains appropriate    Co-evaluation                 AM-PAC OT "6 Clicks" Daily Activity     Outcome Measure   Help from another person eating meals?: None Help from another person taking care of personal grooming?: A Little Help from another person toileting, which includes using toliet, bedpan, or urinal?: A Little Help from another person bathing (including washing, rinsing, drying)?: A Lot Help from another person to put on and taking off regular upper body clothing?: A Lot Help from another person to put on and taking off regular lower body clothing?: A Lot 6 Click Score: 16    End of Session Equipment Utilized During Treatment: Rolling walker;Back brace  OT Visit Diagnosis: Unsteadiness on feet (R26.81);Other abnormalities of gait and mobility (R26.89);Pain;Muscle weakness (generalized) (M62.81) Pain -  Right/Left: Right Pain - part of body: Hip;Leg   Activity Tolerance Patient tolerated treatment well   Patient Left in bed;with call bell/phone within reach   Nurse Communication Mobility status (Pt c/o dizzyness)        [id number] OT Time Calculation (min): 35 min  Charges: OT General Charges $OT Visit: 1 Visit OT Treatments $Self Care/Home Management : 23-37 mins  Roslynn Holte H., OTR/L Acute Rehabilitation  Carmelita Amparo Elane Leary Mcnulty 04/29/2021, 4:24 PM

## 2021-04-29 NOTE — Progress Notes (Signed)
Chart reviewed after discussion with patient's nurse, Janee Morn, RN regarding + Covid  test on 04/23/2021 and potential need for airborne precautions.  Review indicated: note on 04/24/2021 Anesthesia Pre-procedure by Dr. Nolon Nations  " + 04/23/21 appears related to her known Covid infection within the past 90 days and not due to active infection."   Infection Prevention is not recommending  COVID isolation precautions at this time due to above physician's assessment; and the patient is currently asymptomatic.

## 2021-04-29 NOTE — Plan of Care (Signed)

## 2021-04-29 NOTE — Progress Notes (Signed)
RN notified by PT that Pt was lightheaded, clammy and shaky at the end of their session. Pt denies lightheadedness, resting comfortably in bed, denies pain except for some mild discomfort to her lower back.  Per OT, Pt had an episode of dizziness and verbalized her ears felt funny while ambulating. RN rounded on Pt, she is resting in bed, denies pain at this time and stated that she feels okay now. Vital signs taken and recorded, not in acute distress, will continue to monitor and endorse accordingly to oncoming RN.

## 2021-04-30 NOTE — Progress Notes (Signed)
Occupational Therapy Treatment Patient Details Name: Jamie Beltran MRN: [id number] DOB: 1935-08-11 Today's Date: 04/30/2021    History of present illness The pt is an 86 yo female presenting 8/4 following decompression L4-5 with lateral mass fusion due to ongoing pain. Covid + per pre-op testing 8/1 (originally COVID + July 2, was negative x2 3 weeks later per pt report)--reports she is still weak from [place] prior to surgery.   OT comments  Pt is progressing well, continues to report dizziness and altered hearing and vision with OOB transfers and mobility. Pt resting upon arrival with reports of feeling very tired and fatigued, but agreeable to participate in therapy. Orthostatics taken this session with no significant vital changes. Pt was close min guard throughout session for all mobility due to dizziness and changes in vision. Per pt report, she has experienced these symptoms in the past, but never "this bad." Pt demonstrated great ability to don/doff brace and reported all precautions accurately. Pt continued to benefit from acute OT. D/c plan remains appropriate.   Orthostatic BPs Supine 124/63  Sitting 122/66  Standing 126/66  Standing after 2.5 min 130/65  HR between 70-75 throughout     Follow Up Recommendations  Home health OT;Supervision/Assistance - 24 hour    Equipment Recommendations  None recommended by OT       Precautions / Restrictions Precautions Precautions: Back;Fall Precaution Booklet Issued: No Precaution Comments: pt recalled back precautions, reviewed brace wear and care schedule Required Braces or Orthoses: Spinal Brace Spinal Brace: Lumbar corset;Applied in sitting position Restrictions Weight Bearing Restrictions: No Other Position/Activity Restrictions: no lifting more than 10 lbs       Mobility Bed Mobility Overal bed mobility: Needs Assistance Bed Mobility: Rolling;Sidelying to Sit;Sit to Sidelying Rolling: Min guard Sidelying to sit: Min  guard     Sit to sidelying: Min guard General bed mobility comments: min guard throughout forlog roll and vc for technique    Transfers Overall transfer level: Needs assistance Equipment used: Rolling walker (2 wheeled) Transfers: Sit to/from Stand Sit to Stand: Min guard         General transfer comment: min guard fro safety, pt demonstrated great hand placement    Balance Overall balance assessment: Needs assistance   Sitting balance-Leahy Scale: Fair       Standing balance-Leahy Scale: Poor                             ADL either performed or assessed with clinical judgement   ADL Overall ADL's : Needs assistance/impaired                         Toilet Transfer: Min guard;Ambulation Toilet Transfer Details (indicate cue type and reason): simulated         Functional mobility during ADLs: Min guard;Rolling walker General ADL Comments: close min guard for all mobility this session due to pt report of being dizzy with altered hearing and vision     Vision   Additional Comments: Pt with reports of altered/blurry vision in standing   Perception     Praxis      Cognition Arousal/Alertness: Awake/alert Behavior During Therapy: WFL for tasks assessed/performed Overall Cognitive Status: Within Functional Limits for tasks assessed                         Following Commands: Follows multi-step commands consistently Safety/Judgement: Decreased  awareness of safety     General Comments: pt asleep upon arrival, easy to rouse        Exercises General Exercises - Lower Extremity Ankle Circles/Pumps: AROM;Both;20 reps Long Arc Quad: AROM;Both;10 reps (Discussed using small ROM, working within a range that is pain-free/comfortable for pt's back.) Hip Flexion/Marching: AROM;Both;10 reps (Discussed using small ROM, working within a range that is pain-free/comfortable for pt's back.  Pt. remains reclined in chair for activity to maintain  lumbar precautions.)   Shoulder Instructions       General Comments Pt with incrased dizziness altered hearing and vision this session after standing and short ambulatin with RW. Pt reported new stomache pain, likely due to no BM in 6 days.    Pertinent Vitals/ Pain       Pain Assessment: Faces Pain Score: 2  Faces Pain Scale: Hurts little more Pain Location: R hip > back Pain Descriptors / Indicators: Aching;Discomfort Pain Intervention(s): Limited activity within patient's tolerance;Monitored during session   Frequency  Min 2X/week        Progress Toward Goals  OT Goals(current goals can now be found in the care plan section)  Progress towards OT goals: Progressing toward goals  Acute Rehab OT Goals Patient Stated Goal: To return home to see dog. OT Goal Formulation: With patient Time For Goal Achievement: 05/11/21 Potential to Achieve Goals: Good ADL Goals Pt Will Perform Grooming: with modified independence;standing Pt Will Perform Upper Body Bathing: sitting;with modified independence Pt Will Perform Lower Body Bathing: with modified independence;sit to/from stand;with adaptive equipment Pt Will Perform Upper Body Dressing: sitting;with modified independence Pt Will Perform Lower Body Dressing: with modified independence;with adaptive equipment;sit to/from stand Pt Will Transfer to Toilet: with modified independence;ambulating;bedside commode Pt Will Perform Toileting - Clothing Manipulation and hygiene: with modified independence;sit to/from stand Additional ADL Goal #1: Pt will be Mod I in and OOB for basic ADLs (HOB flat, no rail, increased time)  Plan Discharge plan remains appropriate;Frequency remains appropriate    Co-evaluation        PT goals addressed during session: Mobility/safety with mobility;Balance;Proper use of DME;Strengthening/ROM        AM-PAC OT "6 Clicks" Daily Activity     Outcome Measure   Help from another person eating meals?:  None Help from another person taking care of personal grooming?: A Little Help from another person toileting, which includes using toliet, bedpan, or urinal?: A Little Help from another person bathing (including washing, rinsing, drying)?: A Lot Help from another person to put on and taking off regular upper body clothing?: A Lot Help from another person to put on and taking off regular lower body clothing?: A Lot 6 Click Score: 16    End of Session Equipment Utilized During Treatment: Rolling walker;Back brace  OT Visit Diagnosis: Unsteadiness on feet (R26.81);Other abnormalities of gait and mobility (R26.89);Pain;Muscle weakness (generalized) (M62.81) Pain - Right/Left: Right Pain - part of body: Hip;Leg   Activity Tolerance Patient tolerated treatment well   Patient Left in bed;with call bell/phone within reach   Nurse Communication Mobility status (dizziness, stomache pain)        Time: 1053-1110 OT Time Calculation (min): 17 min  Charges: OT General Charges $OT Visit: 1 Visit OT Treatments $Therapeutic Activity: 8-22 mins    Sritha Chauncey A Kim Oki 04/30/2021, 11:28 AM

## 2021-04-30 NOTE — Plan of Care (Signed)

## 2021-04-30 NOTE — Progress Notes (Addendum)
Subjective: 4 Days Post-Op Procedure(s) (LRB): Microlumbar decompression Lumbar four-five, lateral mass fusion using autologus allograft bone (N/A) Patient reports pain as mild and moderate.   Leg pain better. C/o back pain. Still feeling very fatigued, on/off dizziness  Objective: Vital signs in last 24 hours: Temp:  [97.8 F (36.6 C)-98.2 F (36.8 C)] 98.2 F (36.8 C) (08/08 1409) Pulse Rate:  [68-81] 68 (08/08 1409) Resp:  [15-17] 15 (08/08 0725) BP: (126-148)/(55-69) 148/62 (08/08 1409) SpO2:  [95 %-100 %] 100 % (08/08 1409)  Intake/Output from previous day: 08/07 0701 - 08/08 0700 In: 720 [P.O.:720] Out: 400 [Urine:400] Intake/Output this shift: Total I/O In: 480 [P.O.:480] Out: 600 [Urine:600]  Recent Labs    04/28/21 0514  HGB 9.5*   Recent Labs    04/28/21 0514  WBC 9.3  RBC 3.37*  HCT 29.7*  PLT 172   No results for input(s): NA, K, CL, CO2, BUN, CREATININE, GLUCOSE, CALCIUM in the last 72 hours. No results for input(s): LABPT, INR in the last 72 hours.  Neurologically intact ABD soft Neurovascular intact Sensation intact distally Intact pulses distally Dorsiflexion/Plantar flexion intact Incision: dressing C/D/I and no drainage No cellulitis present Compartment soft No calf pain or sign of DVT   Assessment/Plan: 4 Days Post-Op Procedure(s) (LRB): Microlumbar decompression Lumbar four-five, lateral mass fusion using autologus allograft bone (N/A) Advance diet Up with therapy D/C IV fluids Still having some waxing and waning symptoms of dizziness through the afternoon and does not feel comfortable for D/C today Plan D/C tomorrow as long as medically stable Discussed with Dr Tonita Cong  Discussed with patient this afternoon. NVI. Good PO Good UO. No BM May need Rehab Will Consult Laxatives  Jamie Beltran 04/30/2021, 3:32 PM

## 2021-04-30 NOTE — Progress Notes (Signed)
Physical Therapy Treatment Patient Details Name: Jamie Beltran MRN: [id number] DOB: 1935/01/24 Today's Date: 04/30/2021    History of Present Illness The pt is an 86 yo female presenting 8/4 following decompression L4-5 with lateral mass fusion due to ongoing pain. Covid + per pre-op testing 8/1 (originally COVID + July 2, was negative x2 3 weeks later per pt report)--reports she is still weak from [place] prior to surgery.    PT Comments    Pt. Demos improved activity tolerance today, able to complete stair and gait training without c/o dizziness, tremors or fatigue.  Pt. Demos good verbal recall of precautions, but has tendency to forget precautions when performing functional mobility.  Safe to return home with assist from sister and [facility] PT.  PT to continue to work with pt. During acute care stay to reinforce precautions, improved IND with sit > stand, and promote stair negotiation with appropriate biomechanics instead of side step strategy.  Follow Up Recommendations  Home health PT;Supervision for mobility/OOB     Equipment Recommendations  Rolling walker with 5" wheels    Recommendations for Other Services       Precautions / Restrictions Precautions Precautions: Back;Fall Precaution Comments: Pt able to recall back precautions Required Braces or Orthoses: Spinal Brace Spinal Brace: Lumbar corset;Applied in sitting position    Mobility  Bed Mobility Overal bed mobility: Needs Assistance Bed Mobility: Rolling;Sidelying to Sit Rolling: Min guard Sidelying to sit: Min assist       General bed mobility comments: Pt. requires CGA for appropriate log roll to side to prevent twisting.  Pt. requires min A to complete transfer to sitting EOB.  Able to scoot forward IND so feet are supported on floor.  PT places brace behind patient and then pt. is able to don brace with VCs only. Patient Response: Cooperative  Transfers Overall transfer level: Needs assistance Equipment used:  Rolling walker (2 wheeled) Transfers: Sit to/from Stand Sit to Stand: Min assist         General transfer comment: Pt. requires min A to complete sit > stand with VCs for safety with hand placement.  Pt. educated to complete ankle pumps x 20 in sitting prior to attempting transfer in order to assist with BP during positional change.  Pt. demos good standing balance once up, no c/o dizziness with activity.  Ambulation/Gait Ambulation/Gait assistance: Modified independent (Device/Increase time) Gait Distance (Feet): 150 Feet Assistive device: Rolling walker (2 wheeled) Gait Pattern/deviations: Decreased stride length     General Gait Details: Pt. demos good tolerance to gait training in halls.  Demos slow cadence and dec step length, but no LOB or lightheadedness with activity.  Able to complete without any rest breaks.   Stairs   Stairs assistance: Supervision Stair Management: One rail Right Number of Stairs: 3 General stair comments: Pt. chooses to use 1 rail on R and side step up and down staircase with both hands holding rail.  Pt. states she feels more comfortable doing it this way and demos safety with activity.  VC reminders not to twist to grab rail, but to take multiple small steps to turn body towards rail prior to starting activity.   Wheelchair Mobility    Modified Rankin (Stroke Patients Only)       Balance Overall balance assessment: Modified Independent   Sitting balance-Leahy Scale: Fair       Standing balance-Leahy Scale: Poor  Cognition Arousal/Alertness: Awake/alert Behavior During Therapy: WFL for tasks assessed/performed Overall Cognitive Status: Within Functional Limits for tasks assessed                         Following Commands: Follows multi-step commands consistently Safety/Judgement: Decreased awareness of safety     General Comments: Needs VC reminders not to twist during functional  activities.  Pt. attempts to get OOB without use of log roll technique and twists to reach for phone despite being able to appropriately recall precautions when asked.      Exercises General Exercises - Lower Extremity Ankle Circles/Pumps: AROM;Both;20 reps Long Arc Quad: AROM;Both;10 reps (Discussed using small ROM, working within a range that is pain-free/comfortable for pt's back.) Hip Flexion/Marching: AROM;Both;10 reps (Discussed using small ROM, working within a range that is pain-free/comfortable for pt's back.  Pt. remains reclined in chair for activity to maintain lumbar precautions.)    General Comments General comments (skin integrity, edema, etc.): Declines to use BR while up, stating she went prior to starting PT.      Pertinent Vitals/Pain Pain Assessment: 0-10 Pain Score: 2  Pain Location: Back, pt. states 0/10 at rest, 2/10 with activity. Pain Descriptors / Indicators: Aching;Discomfort Pain Intervention(s): Repositioned    Home Living                      Prior Function            PT Goals (current goals can now be found in the care plan section) Acute Rehab PT Goals Patient Stated Goal: To return home to see dog. PT Goal Formulation: With patient Time For Goal Achievement: 05/14/21 Potential to Achieve Goals: Good Progress towards PT goals: Progressing toward goals    Frequency    Min 5X/week      PT Plan Current plan remains appropriate    Co-evaluation     PT goals addressed during session: Mobility/safety with mobility;Balance;Proper use of DME;Strengthening/ROM        AM-PAC PT "6 Clicks" Mobility   Outcome Measure  Help needed turning from your back to your side while in a flat bed without using bedrails?: A Little Help needed moving from lying on your back to sitting on the side of a flat bed without using bedrails?: A Little Help needed moving to and from a bed to a chair (including a wheelchair)?: A Little Help needed  standing up from a chair using your arms (e.g., wheelchair or bedside chair)?: A Little Help needed to walk in hospital room?: A Little Help needed climbing 3-5 steps with a railing? : A Little 6 Click Score: 18    End of Session Equipment Utilized During Treatment: Gait belt;Back brace Activity Tolerance: Patient tolerated treatment well Patient left: in chair;with call bell/phone within reach;with chair alarm set   PT Visit Diagnosis: Muscle weakness (generalized) (M62.81);History of falling (Z91.81) Pain - part of body:  (back)     Time: [id number] PT Time Calculation (min) (ACUTE ONLY): 28 min  Charges:  $Gait Training: 8-22 mins $Therapeutic Activity: 8-22 mins                     Alaycia Eardley A. Rozell Theiler, PT, DPT Acute Rehabilitation Services Office: [place] 04/30/2021, 9:20 AM

## 2021-05-01 MED ORDER — ASPIRIN EC 81 MG PO TBEC
81.0000 mg | DELAYED_RELEASE_TABLET | Freq: Every day | ORAL | Status: DC
  Administered 2021-05-01: 81 mg via ORAL
  Filled 2021-05-01 (×2): qty 1

## 2021-05-01 MED ORDER — ADULT MULTIVITAMIN W/MINERALS CH
1.0000 | ORAL_TABLET | Freq: Every day | ORAL | Status: DC
  Administered 2021-05-01: 1 via ORAL
  Filled 2021-05-01: qty 1

## 2021-05-01 MED ORDER — ASPIRIN 81 MG PO TBEC: 81.0000 mg | DELAYED_RELEASE_TABLET | Freq: Every day | ORAL | 1 refills | Status: AC

## 2021-05-01 NOTE — Care Management Important Message (Signed)
Important Message  Patient Details  Name: KILEAH WALI MRN: [id number] Date of Birth: 07-Apr-1935   Medicare Important Message Given:  Yes     Bettyanne Dittman 05/01/2021, 3:13 PM

## 2021-05-01 NOTE — Progress Notes (Signed)
Physical Therapy Treatment Patient Details Name: Jamie Beltran MRN: [id number] DOB: 07-18-1935 Today's Date: 05/01/2021    History of Present Illness The pt is an 86 yo female presenting 8/4 following decompression L4-5 with lateral mass fusion due to ongoing pain. Covid + per pre-op testing 8/1 (originally COVID + July 2, was negative x2 3 weeks later per pt report)--reports she is still weak from [place] prior to surgery.    PT Comments    Pt supine in bed this session.  Pt continues to benefit from HHPT at baseline.  Educated on spinal precautions and readjusted brace for improved fit.  Pt able to increase gt distance.  BP 152/72 post session in R arm sitting.    Follow Up Recommendations  Home health PT;Supervision for mobility/OOB     Equipment Recommendations  Rolling walker with 5" wheels    Recommendations for Other Services       Precautions / Restrictions Precautions Precautions: Back;Fall Precaution Booklet Issued: No Precaution Comments: pt recalled back precautions, reviewed brace wear and care schedule Spinal Brace: Lumbar corset;Applied in sitting position Restrictions Weight Bearing Restrictions: No Other Position/Activity Restrictions: no lifting more than 10 lbs    Mobility  Bed Mobility Overal bed mobility: Needs Assistance Bed Mobility: Rolling;Sidelying to Sit;Sit to Sidelying Rolling: Supervision Sidelying to sit: Supervision     Sit to sidelying: Supervision General bed mobility comments: performed all aspects of mobility without assistance.    Transfers Overall transfer level: Needs assistance Equipment used: Rolling walker (2 wheeled) Transfers: Sit to/from Stand Sit to Stand: Supervision         General transfer comment: Cues for safe hand placement  Ambulation/Gait Ambulation/Gait assistance: Supervision Gait Distance (Feet): 320 Feet Assistive device: Rolling walker (2 wheeled) Gait Pattern/deviations: Decreased stride  length;Trunk flexed Gait velocity: decreased   General Gait Details: Cues for posture and forward gaze,   Stairs Stairs:  (denies stairs at home reports she has a ramp.)           Wheelchair Mobility    Modified Rankin (Stroke Patients Only)       Balance Overall balance assessment: Needs assistance   Sitting balance-Leahy Scale: Fair       Standing balance-Leahy Scale: Poor                              Cognition Arousal/Alertness: Awake/alert Behavior During Therapy: WFL for tasks assessed/performed Overall Cognitive Status: Within Functional Limits for tasks assessed                                        Exercises      General Comments        Pertinent Vitals/Pain Pain Assessment: Faces Faces Pain Scale: Hurts little more Pain Location: R hip > back Pain Descriptors / Indicators: Aching;Discomfort Pain Intervention(s): Monitored during session;Repositioned    Home Living                      Prior Function            PT Goals (current goals can now be found in the care plan section) Acute Rehab PT Goals Patient Stated Goal: To return home to see dog. Potential to Achieve Goals: Good Progress towards PT goals: Progressing toward goals    Frequency    Min 5X/week  PT Plan Current plan remains appropriate    Co-evaluation              AM-PAC PT "6 Clicks" Mobility   Outcome Measure  Help needed turning from your back to your side while in a flat bed without using bedrails?: A Little Help needed moving from lying on your back to sitting on the side of a flat bed without using bedrails?: A Little Help needed moving to and from a bed to a chair (including a wheelchair)?: A Little Help needed standing up from a chair using your arms (e.g., wheelchair or bedside chair)?: A Little Help needed to walk in hospital room?: A Little Help needed climbing 3-5 steps with a railing? : A Little 6 Click  Score: 18    End of Session Equipment Utilized During Treatment: Gait belt;Back brace Activity Tolerance: Patient tolerated treatment well Patient left: in chair;with call bell/phone within reach;with chair alarm set Nurse Communication: Mobility status Pain - Right/Left: Right Pain - part of body:  (back)     Time: [id number] PT Time Calculation (min) (ACUTE ONLY): 23 min  Charges:  $Gait Training: 8-22 mins $Therapeutic Activity: 8-22 mins                     Jamie Beltran , PTA Acute Rehabilitation Services Pager [phone] Office [phone]    Jamie Beltran 05/01/2021, 4:10 PM

## 2021-05-01 NOTE — Progress Notes (Signed)
Inpatient Rehab Admissions Coordinator:   CIR consult received and chart reviewed.  Note patient ambulating 150' with modified independence, completing ADLs with min guard or better, and therapies are recommending home health. She have the functional decline or the medical necessity to support a CIR admission.  Agree with therapy recs for home health.  Will sign off at this time.   Shann Medal, PT, DPT Admissions Coordinator [phone] 05/01/21  9:17 AM

## 2021-05-01 NOTE — Progress Notes (Signed)
Subjective: 5 Days Post-Op Procedure(s) (LRB): Microlumbar decompression Lumbar four-five, lateral mass fusion using autologus allograft bone (N/A) Patient reports pain as mild and moderate.   Denies leg pain. No CP, SOB, fever, chills, N/V. On/off dizziness and weakness yesterday, did not feel safe for D/C. Denies dizziness this AM.  Objective: Vital signs in last 24 hours: Temp:  [97.6 F (36.4 C)-98.2 F (36.8 C)] 97.7 F (36.5 C) (08/09 0739) Pulse Rate:  [64-68] 64 (08/09 0739) Resp:  [14] 14 (08/08 2050) BP: (122-152)/(59-121) 152/121 (08/09 0739) SpO2:  [97 %-100 %] 97 % (08/08 2050)  Intake/Output from previous day: 08/08 0701 - 08/09 0700 In: 480 [P.O.:480] Out: 600 [Urine:600] Intake/Output this shift: No intake/output data recorded.  No results for input(s): HGB in the last 72 hours. No results for input(s): WBC, RBC, HCT, PLT in the last 72 hours. No results for input(s): NA, K, CL, CO2, BUN, CREATININE, GLUCOSE, CALCIUM in the last 72 hours. No results for input(s): LABPT, INR in the last 72 hours.  Neurologically intact ABD soft Neurovascular intact Sensation intact distally Intact pulses distally Dorsiflexion/Plantar flexion intact Incision: dressing C/D/I and no drainage No cellulitis present Compartment soft No sign of DVT   Assessment/Plan: 5 Days Post-Op Procedure(s) (LRB): Microlumbar decompression Lumbar four-five, lateral mass fusion using autologus allograft bone (N/A) Advance diet Up with therapy D/C IV fluids Awaiting CIR consult as she has had slow progression with her PT Will discuss with Dr Valentino Saxon Deatra Robinson 05/01/2021, 9:01 AM

## 2021-05-01 NOTE — Progress Notes (Signed)
Pt. Stable, removed pt IV, and pt discharged home with friend.

## 2021-05-02 NOTE — Discharge Summary (Addendum)
Physician Discharge Summary   Patient ID: Jamie Beltran MRN: [id number] DOB/AGE: [birth year] 86 y.o.  Admit date: 04/26/2021 Discharge date: 05/01/2021  Primary Diagnosis:   Spinal stenosis, spondylolisthesis Lumbar four-five  Admission Diagnoses:  Past Medical History:  Diagnosis Date   Basal cell carcinoma    Blood in stool    Breast cancer ([place]) 09/2017   left breast   Chronic back pain    Colonic polyp    COVID 03/24/2021   Diverticulitis    Diverticulosis    Esophageal reflux    Family history of adverse reaction to anesthesia    Sister has N & V   Fibrocystic breast disease    GERD (gastroesophageal reflux disease) 05/29/2016   Hiatal hernia    History of kidney stones    Hyperlipidemia 05/29/2016   Hypothyroidism 05/29/2016   Migraine 05/29/2016   Migraine headache    OA (osteoarthritis)    back, hips, knees   Other and unspecified hyperlipidemia    Discharge Diagnoses:   Active Problems:   Spinal stenosis at L4-L5 level  Procedure:  Procedure(s) (LRB): Microlumbar decompression Lumbar four-five, lateral mass fusion using autologus allograft bone (N/A)   Consults: None  HPI:  See H&P    Laboratory Data: Orders Only on 04/23/2021  Component Date Value Ref Range Status   SARS Coronavirus 2 04/23/2021 RESULT: POSITIVE (A)  Final   Comment: RESULT: POSITIVESARS-CoV-2 INTERPRETATION:A POSITIVE  test result means that SARS-CoV-2 RNA was present in the specimen above the limit of detection of this test. The presence of SARS-CoV-2 RNA is indicative of infection with SARS-CoV-2. Detection of  SARS-CoV-2 RNA may not rule-out bacterial infection or co-infection with other viruses. Positive and negative predictive values of testing are highly dependent on prevalence. False positive test results are more likely when prevalence of disease is  low.The expected result is NEGATIVE.Fact Sheet for Healthcare Providers: SisterViews.hu Sheet  for Patients: SwimConditioning.hu Reference Range - Negative    No results for input(s): HGB in the last 72 hours. No results for input(s): WBC, RBC, HCT, PLT in the last 72 hours. No results for input(s): NA, K, CL, CO2, BUN, CREATININE, GLUCOSE, CALCIUM in the last 72 hours. No results for input(s): LABPT, INR in the last 72 hours.  X-Rays:DG Lumbar Spine 2-3 Views  Result Date: 04/26/2021 CLINICAL DATA:  Surgery, elective Z41.9 (ICD-10-CM) L4-L5 decompression. EXAM: LUMBAR SPINE - 2-3 VIEW COMPARISON:  04/18/2021. FINDINGS: Three intraoperative radiographs. First image demonstrates needles in the posterior paraspinal soft tissues at approximately the L4-L5 and L5-S1 levels. Second image demonstrates retractors in the posterior paraspinal soft tissues, centered just below the L4-L5 level. Final image demonstrates surgical probes with tips projecting at the inferior L4 vertebral body and superior L5 vertebral body levels with curvilinear density in the expected region of the canal at the L4-L5 level, potentially artifactual or surgical material. Please see the performing provider's procedural report for further detail. IMPRESSION: Intraoperative radiographs, as detailed above. Electronically Signed   By: Margaretha Sheffield MD   On: 04/26/2021 13:08   DG Lumbar Spine 2-3 Views  Result Date: 04/21/2021 CLINICAL DATA:  Preoperative study prior to planned lumbar surgery. EXAM: LUMBAR SPINE - 2-3 VIEW COMPARISON:  11/23/2014 FINDINGS: No fracture or significant disc space narrowing. Facet hypertrophy present most prominently at L4-5 and L5-S1. There is a grade 1 anterolisthesis of L4 on L5 of approximately 7 mm in the neutral position. No fractures or bony lesions identified. Very minimal leftward convex curvature of the  lumbar spine in the frontal projection. IMPRESSION: Grade 1 anterolisthesis of L4 on L5 measuring approximately 7 mm in the neutral position. Facet hypertrophy  predominantly at L4-5 and L5-S1. Electronically Signed   By: Aletta Edouard M.D.   On: 04/21/2021 09:01    EKG: Orders placed or performed in visit on 04/18/21   EKG 12-Lead   EKG 12-Lead   EKG 12-Lead     Hospital Course: Patient was admitted to [facility] and taken to the OR and underwent the above state procedure without complications.  Patient tolerated the procedure well and was later transferred to the recovery room and then to the orthopaedic floor for postoperative care.  They were given PO and IV analgesics for pain control following their surgery.  They were given 24 hours of postoperative antibiotics.   PT was consulted postop to assist with mobility and transfers.  The patient was allowed to be WBAT with therapy and was taught back precautions. Discharge planning was consulted to help with postop disposition and equipment needs.  Patient had a fair night on the evening of surgery and started to get up OOB with therapy on day one. Patient was seen in rounds and was ready to go home on day five  They were given discharge instructions and dressing directions.  They were instructed on when to follow up in the office with Dr. Tonita Cong.   Diet: Regular diet Activity:WBAT, Lspine precautions Follow-up:in 10 days Disposition - Home with HHPT Discharged Condition: good   Discharge Instructions     Call MD / Call 911   Complete by: As directed    If you experience chest pain or shortness of breath, CALL 911 and be transported to the hospital emergency room.  If you develope a fever above 101 F, pus (white drainage) or increased drainage or redness at the wound, or calf pain, call your surgeon's office.   Constipation Prevention   Complete by: As directed    Drink plenty of fluids.  Prune juice may be helpful.  You may use a stool softener, such as Colace (over the counter) 100 mg twice a day.  Use MiraLax (over the counter) for constipation as needed.   Diet - low sodium heart  healthy   Complete by: As directed    Increase activity slowly as tolerated   Complete by: As directed    Post-operative opioid taper instructions:   Complete by: As directed    POST-OPERATIVE OPIOID TAPER INSTRUCTIONS: It is important to wean off of your opioid medication as soon as possible. If you do not need pain medication after your surgery it is ok to stop day one. Opioids include: Codeine, Hydrocodone(Norco, Vicodin), Oxycodone(Percocet, oxycontin) and hydromorphone amongst others.  Long term and even short term use of opiods can cause: Increased pain response Dependence Constipation Depression Respiratory depression And more.  Withdrawal symptoms can include Flu like symptoms Nausea, vomiting And more Techniques to manage these symptoms Hydrate well Eat regular healthy meals Stay active Use relaxation techniques(deep breathing, meditating, yoga) Do Not substitute Alcohol to help with tapering If you have been on opioids for less than two weeks and do not have pain than it is ok to stop all together.  Plan to wean off of opioids This plan should start within one week post op of your joint replacement. Maintain the same interval or time between taking each dose and first decrease the dose.  Cut the total daily intake of opioids by one tablet each  day Next start to increase the time between doses. The last dose that should be eliminated is the evening dose.         Allergies as of 05/01/2021       Reactions   Codeine Other (See Comments)   "Makes me crazy."   Penicillins Hives, Swelling   Has patient had a PCN reaction causing immediate rash, facial/tongue/throat swelling, SOB or lightheadedness with hypotension: Yes Has patient had a PCN reaction causing severe rash involving mucus membranes or skin necrosis: No Has patient had a PCN reaction that required hospitalization No Has patient had a PCN reaction occurring within the last 10 years: No If all of the above  answers are "NO", then may proceed with Cephalosporin use.   Adhesive [tape] Itching   Heart monitor pads   Protonix [pantoprazole Sodium] Diarrhea   With 40 mg twice daily, can tolerate 40 mg once daily        Medication List     TAKE these medications    anastrozole 1 MG tablet Commonly known as: ARIMIDEX TAKE 1 TABLET BY MOUTH  DAILY   aspirin 81 MG EC tablet Take 1 tablet (81 mg total) by mouth daily. Swallow whole.   conjugated estrogens vaginal cream Commonly known as: PREMARIN Place 1 Applicatorful vaginally daily as needed (itching).   cycloSPORINE 0.05 % ophthalmic emulsion Commonly known as: RESTASIS Place 1 drop into both eyes 2 (two) times daily.   diclofenac sodium 1 % Gel Commonly known as: VOLTAREN Apply 2 g topically daily as needed (pain).   docusate sodium 100 MG capsule Commonly known as: COLACE Take 1 capsule (100 mg total) by mouth 2 (two) times daily as needed for mild constipation.   escitalopram 20 MG tablet Commonly known as: LEXAPRO Take 20 mg by mouth daily.   famotidine 20 MG tablet Commonly known as: PEPCID Take 20 mg by mouth at bedtime.   gabapentin 300 MG capsule Commonly known as: NEURONTIN Take 300 mg by mouth 2 (two) times daily.   HYDROcodone-acetaminophen 10-325 MG tablet Commonly known as: NORCO Take 1 tablet by mouth every 4 (four) hours as needed for moderate pain. What changed: when to take this   hydrocortisone 2.5 % cream Apply 1 application topically 2 (two) times daily as needed (irritation).   levothyroxine 88 MCG tablet Commonly known as: SYNTHROID Take 88 mcg by mouth daily before breakfast.   Magnesium 250 MG Tabs Take 250 mg by mouth daily.   omeprazole 40 MG capsule Commonly known as: PRILOSEC Take 40 mg by mouth daily.   polyethylene glycol 17 g packet Commonly known as: MIRALAX / GLYCOLAX Take 17 g by mouth daily as needed for mild constipation.   Vitamin D 50 MCG (2000 UT) tablet Take 2,000  Units by mouth daily.        Follow-up Information     Susa Day, MD Follow up in 2 week(s).   Specialty: Orthopedic Surgery Contact information: [address] [place] [address] [place] [address] [phone]         [facility] Follow up.   Why: [facility] will contact you to schedule your first home visit. Contact information: P: [phone]                Signed: Lacie Draft PA-C Orthopaedic Surgery 05/02/2021, 8:34 AM

## 2021-05-03 DIAGNOSIS — M160 Bilateral primary osteoarthritis of hip: Secondary | ICD-10-CM | POA: Diagnosis not present

## 2021-05-03 DIAGNOSIS — M170 Bilateral primary osteoarthritis of knee: Secondary | ICD-10-CM | POA: Diagnosis not present

## 2021-05-03 DIAGNOSIS — Z9181 History of falling: Secondary | ICD-10-CM | POA: Diagnosis not present

## 2021-05-03 DIAGNOSIS — M4316 Spondylolisthesis, lumbar region: Secondary | ICD-10-CM | POA: Diagnosis not present

## 2021-05-03 DIAGNOSIS — K219 Gastro-esophageal reflux disease without esophagitis: Secondary | ICD-10-CM | POA: Diagnosis not present

## 2021-05-03 DIAGNOSIS — Z4789 Encounter for other orthopedic aftercare: Secondary | ICD-10-CM | POA: Diagnosis not present

## 2021-05-03 DIAGNOSIS — E785 Hyperlipidemia, unspecified: Secondary | ICD-10-CM | POA: Diagnosis not present

## 2021-05-03 DIAGNOSIS — M48062 Spinal stenosis, lumbar region with neurogenic claudication: Secondary | ICD-10-CM | POA: Diagnosis not present

## 2021-05-03 DIAGNOSIS — Z981 Arthrodesis status: Secondary | ICD-10-CM | POA: Diagnosis not present

## 2021-05-08 DIAGNOSIS — M170 Bilateral primary osteoarthritis of knee: Secondary | ICD-10-CM | POA: Diagnosis not present

## 2021-05-08 DIAGNOSIS — Z4789 Encounter for other orthopedic aftercare: Secondary | ICD-10-CM | POA: Diagnosis not present

## 2021-05-08 DIAGNOSIS — M160 Bilateral primary osteoarthritis of hip: Secondary | ICD-10-CM | POA: Diagnosis not present

## 2021-05-08 DIAGNOSIS — M4316 Spondylolisthesis, lumbar region: Secondary | ICD-10-CM | POA: Diagnosis not present

## 2021-05-08 DIAGNOSIS — K219 Gastro-esophageal reflux disease without esophagitis: Secondary | ICD-10-CM | POA: Diagnosis not present

## 2021-05-08 DIAGNOSIS — E785 Hyperlipidemia, unspecified: Secondary | ICD-10-CM | POA: Diagnosis not present

## 2021-05-08 DIAGNOSIS — Z981 Arthrodesis status: Secondary | ICD-10-CM | POA: Diagnosis not present

## 2021-05-08 DIAGNOSIS — Z9181 History of falling: Secondary | ICD-10-CM | POA: Diagnosis not present

## 2021-05-08 DIAGNOSIS — M48062 Spinal stenosis, lumbar region with neurogenic claudication: Secondary | ICD-10-CM | POA: Diagnosis not present

## 2021-05-10 DIAGNOSIS — Z4889 Encounter for other specified surgical aftercare: Secondary | ICD-10-CM | POA: Diagnosis not present

## 2021-05-15 DIAGNOSIS — M170 Bilateral primary osteoarthritis of knee: Secondary | ICD-10-CM | POA: Diagnosis not present

## 2021-05-15 DIAGNOSIS — C50912 Malignant neoplasm of unspecified site of left female breast: Secondary | ICD-10-CM | POA: Diagnosis not present

## 2021-05-15 DIAGNOSIS — F4321 Adjustment disorder with depressed mood: Secondary | ICD-10-CM | POA: Diagnosis not present

## 2021-05-15 DIAGNOSIS — K219 Gastro-esophageal reflux disease without esophagitis: Secondary | ICD-10-CM | POA: Diagnosis not present

## 2021-05-15 DIAGNOSIS — G8929 Other chronic pain: Secondary | ICD-10-CM | POA: Diagnosis not present

## 2021-05-15 DIAGNOSIS — E7800 Pure hypercholesterolemia, unspecified: Secondary | ICD-10-CM | POA: Diagnosis not present

## 2021-05-15 DIAGNOSIS — E785 Hyperlipidemia, unspecified: Secondary | ICD-10-CM | POA: Diagnosis not present

## 2021-05-15 DIAGNOSIS — E039 Hypothyroidism, unspecified: Secondary | ICD-10-CM | POA: Diagnosis not present

## 2021-05-15 DIAGNOSIS — F320 Major depressive disorder, single episode, mild: Secondary | ICD-10-CM | POA: Diagnosis not present

## 2021-05-16 DIAGNOSIS — R55 Syncope and collapse: Secondary | ICD-10-CM | POA: Diagnosis not present

## 2021-05-17 DIAGNOSIS — M160 Bilateral primary osteoarthritis of hip: Secondary | ICD-10-CM | POA: Diagnosis not present

## 2021-05-17 DIAGNOSIS — Z4789 Encounter for other orthopedic aftercare: Secondary | ICD-10-CM | POA: Diagnosis not present

## 2021-05-17 DIAGNOSIS — K219 Gastro-esophageal reflux disease without esophagitis: Secondary | ICD-10-CM | POA: Diagnosis not present

## 2021-05-17 DIAGNOSIS — M48062 Spinal stenosis, lumbar region with neurogenic claudication: Secondary | ICD-10-CM | POA: Diagnosis not present

## 2021-05-17 DIAGNOSIS — Z981 Arthrodesis status: Secondary | ICD-10-CM | POA: Diagnosis not present

## 2021-05-17 DIAGNOSIS — M4316 Spondylolisthesis, lumbar region: Secondary | ICD-10-CM | POA: Diagnosis not present

## 2021-05-17 DIAGNOSIS — M170 Bilateral primary osteoarthritis of knee: Secondary | ICD-10-CM | POA: Diagnosis not present

## 2021-05-17 DIAGNOSIS — Z9181 History of falling: Secondary | ICD-10-CM | POA: Diagnosis not present

## 2021-05-17 DIAGNOSIS — E785 Hyperlipidemia, unspecified: Secondary | ICD-10-CM | POA: Diagnosis not present

## 2021-05-23 DIAGNOSIS — C50912 Malignant neoplasm of unspecified site of left female breast: Secondary | ICD-10-CM | POA: Diagnosis not present

## 2021-05-23 DIAGNOSIS — E039 Hypothyroidism, unspecified: Secondary | ICD-10-CM | POA: Diagnosis not present

## 2021-05-23 DIAGNOSIS — I959 Hypotension, unspecified: Secondary | ICD-10-CM | POA: Diagnosis not present

## 2021-05-23 DIAGNOSIS — M549 Dorsalgia, unspecified: Secondary | ICD-10-CM | POA: Diagnosis not present

## 2021-05-23 DIAGNOSIS — F320 Major depressive disorder, single episode, mild: Secondary | ICD-10-CM | POA: Diagnosis not present

## 2021-05-23 DIAGNOSIS — K219 Gastro-esophageal reflux disease without esophagitis: Secondary | ICD-10-CM | POA: Diagnosis not present

## 2021-05-23 DIAGNOSIS — E7800 Pure hypercholesterolemia, unspecified: Secondary | ICD-10-CM | POA: Diagnosis not present

## 2021-05-25 DIAGNOSIS — Z4789 Encounter for other orthopedic aftercare: Secondary | ICD-10-CM | POA: Diagnosis not present

## 2021-05-25 DIAGNOSIS — K219 Gastro-esophageal reflux disease without esophagitis: Secondary | ICD-10-CM | POA: Diagnosis not present

## 2021-05-25 DIAGNOSIS — Z981 Arthrodesis status: Secondary | ICD-10-CM | POA: Diagnosis not present

## 2021-05-25 DIAGNOSIS — E785 Hyperlipidemia, unspecified: Secondary | ICD-10-CM | POA: Diagnosis not present

## 2021-05-25 DIAGNOSIS — M160 Bilateral primary osteoarthritis of hip: Secondary | ICD-10-CM | POA: Diagnosis not present

## 2021-05-25 DIAGNOSIS — M48062 Spinal stenosis, lumbar region with neurogenic claudication: Secondary | ICD-10-CM | POA: Diagnosis not present

## 2021-05-25 DIAGNOSIS — M4316 Spondylolisthesis, lumbar region: Secondary | ICD-10-CM | POA: Diagnosis not present

## 2021-05-25 DIAGNOSIS — M170 Bilateral primary osteoarthritis of knee: Secondary | ICD-10-CM | POA: Diagnosis not present

## 2021-05-25 DIAGNOSIS — Z9181 History of falling: Secondary | ICD-10-CM | POA: Diagnosis not present

## 2021-05-31 DIAGNOSIS — M170 Bilateral primary osteoarthritis of knee: Secondary | ICD-10-CM | POA: Diagnosis not present

## 2021-05-31 DIAGNOSIS — Z4789 Encounter for other orthopedic aftercare: Secondary | ICD-10-CM | POA: Diagnosis not present

## 2021-05-31 DIAGNOSIS — M4316 Spondylolisthesis, lumbar region: Secondary | ICD-10-CM | POA: Diagnosis not present

## 2021-05-31 DIAGNOSIS — Z981 Arthrodesis status: Secondary | ICD-10-CM | POA: Diagnosis not present

## 2021-05-31 DIAGNOSIS — R319 Hematuria, unspecified: Secondary | ICD-10-CM | POA: Diagnosis not present

## 2021-05-31 DIAGNOSIS — K219 Gastro-esophageal reflux disease without esophagitis: Secondary | ICD-10-CM | POA: Diagnosis not present

## 2021-05-31 DIAGNOSIS — E785 Hyperlipidemia, unspecified: Secondary | ICD-10-CM | POA: Diagnosis not present

## 2021-05-31 DIAGNOSIS — M160 Bilateral primary osteoarthritis of hip: Secondary | ICD-10-CM | POA: Diagnosis not present

## 2021-05-31 DIAGNOSIS — R399 Unspecified symptoms and signs involving the genitourinary system: Secondary | ICD-10-CM | POA: Diagnosis not present

## 2021-05-31 DIAGNOSIS — M48062 Spinal stenosis, lumbar region with neurogenic claudication: Secondary | ICD-10-CM | POA: Diagnosis not present

## 2021-05-31 DIAGNOSIS — Z9181 History of falling: Secondary | ICD-10-CM | POA: Diagnosis not present

## 2021-06-04 DIAGNOSIS — M160 Bilateral primary osteoarthritis of hip: Secondary | ICD-10-CM | POA: Diagnosis not present

## 2021-06-04 DIAGNOSIS — Z9181 History of falling: Secondary | ICD-10-CM | POA: Diagnosis not present

## 2021-06-04 DIAGNOSIS — Z4789 Encounter for other orthopedic aftercare: Secondary | ICD-10-CM | POA: Diagnosis not present

## 2021-06-04 DIAGNOSIS — M48062 Spinal stenosis, lumbar region with neurogenic claudication: Secondary | ICD-10-CM | POA: Diagnosis not present

## 2021-06-04 DIAGNOSIS — M4316 Spondylolisthesis, lumbar region: Secondary | ICD-10-CM | POA: Diagnosis not present

## 2021-06-04 DIAGNOSIS — M170 Bilateral primary osteoarthritis of knee: Secondary | ICD-10-CM | POA: Diagnosis not present

## 2021-06-04 DIAGNOSIS — Z981 Arthrodesis status: Secondary | ICD-10-CM | POA: Diagnosis not present

## 2021-06-04 DIAGNOSIS — K219 Gastro-esophageal reflux disease without esophagitis: Secondary | ICD-10-CM | POA: Diagnosis not present

## 2021-06-04 DIAGNOSIS — E785 Hyperlipidemia, unspecified: Secondary | ICD-10-CM | POA: Diagnosis not present

## 2021-06-07 DIAGNOSIS — Z4889 Encounter for other specified surgical aftercare: Secondary | ICD-10-CM | POA: Diagnosis not present

## 2021-06-11 DIAGNOSIS — M4316 Spondylolisthesis, lumbar region: Secondary | ICD-10-CM | POA: Diagnosis not present

## 2021-06-11 DIAGNOSIS — Z9181 History of falling: Secondary | ICD-10-CM | POA: Diagnosis not present

## 2021-06-11 DIAGNOSIS — M48062 Spinal stenosis, lumbar region with neurogenic claudication: Secondary | ICD-10-CM | POA: Diagnosis not present

## 2021-06-11 DIAGNOSIS — Z4789 Encounter for other orthopedic aftercare: Secondary | ICD-10-CM | POA: Diagnosis not present

## 2021-06-11 DIAGNOSIS — Z981 Arthrodesis status: Secondary | ICD-10-CM | POA: Diagnosis not present

## 2021-06-11 DIAGNOSIS — M160 Bilateral primary osteoarthritis of hip: Secondary | ICD-10-CM | POA: Diagnosis not present

## 2021-06-11 DIAGNOSIS — K219 Gastro-esophageal reflux disease without esophagitis: Secondary | ICD-10-CM | POA: Diagnosis not present

## 2021-06-11 DIAGNOSIS — M170 Bilateral primary osteoarthritis of knee: Secondary | ICD-10-CM | POA: Diagnosis not present

## 2021-06-11 DIAGNOSIS — E785 Hyperlipidemia, unspecified: Secondary | ICD-10-CM | POA: Diagnosis not present

## 2021-06-20 DIAGNOSIS — M4316 Spondylolisthesis, lumbar region: Secondary | ICD-10-CM | POA: Diagnosis not present

## 2021-06-20 DIAGNOSIS — K219 Gastro-esophageal reflux disease without esophagitis: Secondary | ICD-10-CM | POA: Diagnosis not present

## 2021-06-20 DIAGNOSIS — M48062 Spinal stenosis, lumbar region with neurogenic claudication: Secondary | ICD-10-CM | POA: Diagnosis not present

## 2021-06-20 DIAGNOSIS — E785 Hyperlipidemia, unspecified: Secondary | ICD-10-CM | POA: Diagnosis not present

## 2021-06-20 DIAGNOSIS — M160 Bilateral primary osteoarthritis of hip: Secondary | ICD-10-CM | POA: Diagnosis not present

## 2021-06-20 DIAGNOSIS — Z9181 History of falling: Secondary | ICD-10-CM | POA: Diagnosis not present

## 2021-06-20 DIAGNOSIS — Z981 Arthrodesis status: Secondary | ICD-10-CM | POA: Diagnosis not present

## 2021-06-20 DIAGNOSIS — Z4789 Encounter for other orthopedic aftercare: Secondary | ICD-10-CM | POA: Diagnosis not present

## 2021-06-20 DIAGNOSIS — M170 Bilateral primary osteoarthritis of knee: Secondary | ICD-10-CM | POA: Diagnosis not present

## 2021-06-22 DIAGNOSIS — E039 Hypothyroidism, unspecified: Secondary | ICD-10-CM | POA: Diagnosis not present

## 2021-06-22 DIAGNOSIS — F4321 Adjustment disorder with depressed mood: Secondary | ICD-10-CM | POA: Diagnosis not present

## 2021-06-22 DIAGNOSIS — F320 Major depressive disorder, single episode, mild: Secondary | ICD-10-CM | POA: Diagnosis not present

## 2021-06-22 DIAGNOSIS — E7800 Pure hypercholesterolemia, unspecified: Secondary | ICD-10-CM | POA: Diagnosis not present

## 2021-06-22 DIAGNOSIS — G8929 Other chronic pain: Secondary | ICD-10-CM | POA: Diagnosis not present

## 2021-06-22 DIAGNOSIS — K219 Gastro-esophageal reflux disease without esophagitis: Secondary | ICD-10-CM | POA: Diagnosis not present

## 2021-06-22 DIAGNOSIS — E785 Hyperlipidemia, unspecified: Secondary | ICD-10-CM | POA: Diagnosis not present

## 2021-06-28 DIAGNOSIS — F320 Major depressive disorder, single episode, mild: Secondary | ICD-10-CM | POA: Diagnosis not present

## 2021-06-28 DIAGNOSIS — E7800 Pure hypercholesterolemia, unspecified: Secondary | ICD-10-CM | POA: Diagnosis not present

## 2021-06-28 DIAGNOSIS — E039 Hypothyroidism, unspecified: Secondary | ICD-10-CM | POA: Diagnosis not present

## 2021-06-28 DIAGNOSIS — M170 Bilateral primary osteoarthritis of knee: Secondary | ICD-10-CM | POA: Diagnosis not present

## 2021-06-28 DIAGNOSIS — F4321 Adjustment disorder with depressed mood: Secondary | ICD-10-CM | POA: Diagnosis not present

## 2021-06-28 DIAGNOSIS — E785 Hyperlipidemia, unspecified: Secondary | ICD-10-CM | POA: Diagnosis not present

## 2021-06-28 DIAGNOSIS — K219 Gastro-esophageal reflux disease without esophagitis: Secondary | ICD-10-CM | POA: Diagnosis not present

## 2021-06-28 DIAGNOSIS — G8929 Other chronic pain: Secondary | ICD-10-CM | POA: Diagnosis not present

## 2021-07-19 DIAGNOSIS — Z4889 Encounter for other specified surgical aftercare: Secondary | ICD-10-CM | POA: Diagnosis not present

## 2021-08-03 DIAGNOSIS — Z0000 Encounter for general adult medical examination without abnormal findings: Secondary | ICD-10-CM | POA: Diagnosis not present

## 2021-08-03 DIAGNOSIS — K219 Gastro-esophageal reflux disease without esophagitis: Secondary | ICD-10-CM | POA: Diagnosis not present

## 2021-08-03 DIAGNOSIS — E039 Hypothyroidism, unspecified: Secondary | ICD-10-CM | POA: Diagnosis not present

## 2021-08-03 DIAGNOSIS — R591 Generalized enlarged lymph nodes: Secondary | ICD-10-CM | POA: Diagnosis not present

## 2021-08-03 DIAGNOSIS — E7800 Pure hypercholesterolemia, unspecified: Secondary | ICD-10-CM | POA: Diagnosis not present

## 2021-08-03 DIAGNOSIS — G894 Chronic pain syndrome: Secondary | ICD-10-CM | POA: Diagnosis not present

## 2021-08-03 DIAGNOSIS — C50912 Malignant neoplasm of unspecified site of left female breast: Secondary | ICD-10-CM | POA: Diagnosis not present

## 2021-08-03 DIAGNOSIS — F320 Major depressive disorder, single episode, mild: Secondary | ICD-10-CM | POA: Diagnosis not present

## 2021-08-06 ENCOUNTER — Telehealth: Payer: Self-pay | Admitting: Hematology and Oncology

## 2021-08-06 DIAGNOSIS — J069 Acute upper respiratory infection, unspecified: Secondary | ICD-10-CM | POA: Diagnosis not present

## 2021-08-06 DIAGNOSIS — Z20828 Contact with and (suspected) exposure to other viral communicable diseases: Secondary | ICD-10-CM | POA: Diagnosis not present

## 2021-08-06 NOTE — Telephone Encounter (Signed)
Scheduled appt per 11/14 referral. Pt is an established pt of Dr. Lindi Adie, being referred back over. Pt is aware of appt date and time.

## 2021-08-27 DIAGNOSIS — L814 Other melanin hyperpigmentation: Secondary | ICD-10-CM | POA: Diagnosis not present

## 2021-08-27 DIAGNOSIS — R208 Other disturbances of skin sensation: Secondary | ICD-10-CM | POA: Diagnosis not present

## 2021-08-27 DIAGNOSIS — D225 Melanocytic nevi of trunk: Secondary | ICD-10-CM | POA: Diagnosis not present

## 2021-08-27 DIAGNOSIS — L820 Inflamed seborrheic keratosis: Secondary | ICD-10-CM | POA: Diagnosis not present

## 2021-08-27 DIAGNOSIS — D485 Neoplasm of uncertain behavior of skin: Secondary | ICD-10-CM | POA: Diagnosis not present

## 2021-08-27 DIAGNOSIS — L821 Other seborrheic keratosis: Secondary | ICD-10-CM | POA: Diagnosis not present

## 2021-08-27 DIAGNOSIS — L570 Actinic keratosis: Secondary | ICD-10-CM | POA: Diagnosis not present

## 2021-08-27 DIAGNOSIS — L309 Dermatitis, unspecified: Secondary | ICD-10-CM | POA: Diagnosis not present

## 2021-09-06 NOTE — Progress Notes (Incomplete)
Patient Care Team: Shirline Frees, MD as PCP - General (Family Medicine) Nicholas Lose, MD as Consulting Physician (Hematology and Oncology) Excell Seltzer, MD (Inactive) as Consulting Physician (General Surgery) Gardenia Phlegm, NP as Nurse Practitioner (Hematology and Oncology)  DIAGNOSIS:    ICD-10-CM   1. Malignant neoplasm of upper-inner quadrant of left breast in female, estrogen receptor positive ([place])  C50.212    Z17.0       SUMMARY OF ONCOLOGIC HISTORY: Oncology History  Malignant neoplasm of upper-inner quadrant of left breast in female, estrogen receptor positive ([place])  10/13/2017 Initial Diagnosis   Left breast palpable mass and calcifications which are lateral and anterior to the mass, ultrasound 1.5 cm at 9:00, satellite mass, 3 additional masses 5 mm, 4 mm, 4 mm not biopsied.  Biopsy of calcifications: DCIS ER 100%, PR 100%; biopsy of the mass IDC grade 2 with DCIS ER 90%, PR 100%, Ki-67 30%, HER-2 negative ratio 1.83, T1 cN0 stage I a clinical stage   10/31/2017 Surgery   Left breast mastectomy ([place]): IDC, grade 2, 1.6 cm, margins negative, no SLNB   10/2017 -  Anti-estrogen oral therapy   Letrozole daily switched to anastrozole 04/27/2018 due to hair loss   02/04/2018 Cancer Staging   Staging form: Breast, AJCC 8th Edition - Pathologic: Stage IA (pT1c, pN0, cM0, G2, ER+, PR+, HER2-) - Signed by Gardenia Phlegm, NP on 02/04/2018      CHIEF COMPLIANT: Follow-up of left breast cancer  INTERVAL HISTORY: Jamie Beltran is a 86 y.o. with above-mentioned history of left breast cancer currently on anastrozole therapy. Mammogram on 12/12/2020 showed no evidence of malignancy. She presents to the clinic today for follow-up.   ALLERGIES:  is allergic to codeine, penicillins, adhesive [tape], and protonix [pantoprazole sodium].  MEDICATIONS:  Current Outpatient Medications  Medication Sig Dispense Refill   anastrozole (ARIMIDEX) 1 MG tablet TAKE  1 TABLET BY MOUTH  DAILY 90 tablet 3   aspirin EC 81 MG EC tablet Take 1 tablet (81 mg total) by mouth daily. Swallow whole. 30 tablet 1   Cholecalciferol (VITAMIN D) 50 MCG (2000 UT) tablet Take 2,000 Units by mouth daily.     conjugated estrogens (PREMARIN) vaginal cream Place 1 Applicatorful vaginally daily as needed (itching).     cycloSPORINE (RESTASIS) 0.05 % ophthalmic emulsion Place 1 drop into both eyes 2 (two) times daily.     diclofenac sodium (VOLTAREN) 1 % GEL Apply 2 g topically daily as needed (pain).      docusate sodium (COLACE) 100 MG capsule Take 1 capsule (100 mg total) by mouth 2 (two) times daily as needed for mild constipation. 40 capsule 0   escitalopram (LEXAPRO) 20 MG tablet Take 20 mg by mouth daily.     famotidine (PEPCID) 20 MG tablet Take 20 mg by mouth at bedtime.     gabapentin (NEURONTIN) 300 MG capsule Take 300 mg by mouth 2 (two) times daily.      HYDROcodone-acetaminophen (NORCO) 10-325 MG tablet Take 1 tablet by mouth every 4 (four) hours as needed for moderate pain. 42 tablet 0   hydrocortisone 2.5 % cream Apply 1 application topically 2 (two) times daily as needed (irritation).     levothyroxine (SYNTHROID) 88 MCG tablet Take 88 mcg by mouth daily before breakfast.     Magnesium 250 MG TABS Take 250 mg by mouth daily.     omeprazole (PRILOSEC) 40 MG capsule Take 40 mg by mouth daily.  polyethylene glycol (MIRALAX / GLYCOLAX) 17 g packet Take 17 g by mouth daily as needed for mild constipation. 14 each 0   No current facility-administered medications for this visit.    PHYSICAL EXAMINATION: ECOG PERFORMANCE STATUS: {CHL ONC ECOG [phone]}  There were no vitals filed for this visit. There were no vitals filed for this visit.  BREAST:*** No palpable masses or nodules in either right or left breasts. No palpable axillary supraclavicular or infraclavicular adenopathy no breast tenderness or nipple discharge. (exam performed in the presence of a  chaperone)  LABORATORY DATA:  I have reviewed the data as listed CMP Latest Ref Rng & Units 04/27/2021 04/18/2021 10/22/2017  Glucose 70 - 99 mg/dL 96 117(H) 127  BUN 8 - 23 mg/dL 5(L) 13 14  Creatinine 0.44 - 1.00 mg/dL 0.76 0.80 0.94  Sodium 135 - 145 mmol/L 141 137 139  Potassium 3.5 - 5.1 mmol/L 4.1 4.0 4.3  Chloride 98 - 111 mmol/L 108 105 103  CO2 22 - 32 mmol/L _0 Calcium 8.9 - 10.3 mg/dL 8.7(L) 9.7 9.6  Total Protein 6.4 - 8.3 g/dL - - 7.4  Total Bilirubin 0.2 - 1.2 mg/dL - - 1.0  Alkaline Phos 40 - 150 U/L - - 80  AST 5 - 34 U/L - - 20  ALT 0 - 55 U/L - - 23    Lab Results  Component Value Date   WBC 9.3 04/28/2021   HGB 9.5 (L) 04/28/2021   HCT 29.7 (L) 04/28/2021   MCV 88.1 04/28/2021   PLT 172 04/28/2021   NEUTROABS 3.6 10/22/2017    ASSESSMENT & PLAN:  No problem-specific Assessment & Plan notes found for this encounter.    No orders of the defined types were placed in this encounter.  The patient has a good understanding of the overall plan. she agrees with it. she will call with any problems that may develop before the next visit here.  Total time spent: *** mins including face to face time and time spent for planning, charting and coordination of care  Rulon Eisenmenger, MD, MPH 09/06/2021  I, Thana Ates, am acting as scribe for Dr. Nicholas Lose.  {insert scribe attestation}

## 2021-09-06 NOTE — Assessment & Plan Note (Deleted)
Left Mastectomy:10/31/2017: IDC grade 2 1.6 cm 0/3 lymph nodes negative, ER PR positive HER-2 negative with Ki-67 of 30%, T1 cN0 stage I a  Recommendation: 1.Letrozole 2.5 mg daily times 5 yearsstarted February 2019, switched to anastrozole 04/27/2018 2.Osteoporosis: Bone density done recently showed a T score of -2.7 suggestive of osteoporosis.  Currently on Fosamax with calcium and vitamin D  Anastrozole toxicities: Hair loss Hot flashes better on anastrozole.  Breast Cancer Surveillance: 1. Breast Exam: 09/07/2021 2. Mammogram right breast:  12/14/2020: Benign breast density category B   Return to clinic in 1 year for follow-up

## 2021-09-07 ENCOUNTER — Other Ambulatory Visit: Payer: Self-pay

## 2021-09-07 ENCOUNTER — Inpatient Hospital Stay: Payer: PPO | Admitting: Hematology and Oncology

## 2021-09-07 ENCOUNTER — Inpatient Hospital Stay: Payer: PPO | Attending: Hematology and Oncology | Admitting: Hematology and Oncology

## 2021-09-07 DIAGNOSIS — Z7982 Long term (current) use of aspirin: Secondary | ICD-10-CM | POA: Insufficient documentation

## 2021-09-07 DIAGNOSIS — Z79811 Long term (current) use of aromatase inhibitors: Secondary | ICD-10-CM | POA: Insufficient documentation

## 2021-09-07 DIAGNOSIS — R222 Localized swelling, mass and lump, trunk: Secondary | ICD-10-CM | POA: Insufficient documentation

## 2021-09-07 DIAGNOSIS — C50212 Malignant neoplasm of upper-inner quadrant of left female breast: Secondary | ICD-10-CM | POA: Insufficient documentation

## 2021-09-07 DIAGNOSIS — Z170 Estrogen receptor positive status [ER+]: Secondary | ICD-10-CM | POA: Diagnosis not present

## 2021-09-07 DIAGNOSIS — Z9012 Acquired absence of left breast and nipple: Secondary | ICD-10-CM | POA: Diagnosis not present

## 2021-09-07 DIAGNOSIS — Z79899 Other long term (current) drug therapy: Secondary | ICD-10-CM | POA: Diagnosis not present

## 2021-09-07 DIAGNOSIS — M810 Age-related osteoporosis without current pathological fracture: Secondary | ICD-10-CM | POA: Diagnosis not present

## 2021-09-07 NOTE — Assessment & Plan Note (Signed)
Left Mastectomy: 10/31/2017: IDC grade 2 1.6 cm 0/3 lymph nodes negative, ER PR positive HER-2 negative with Ki-67 of 30%, T1 cN0 stage I a °  °Recommendation: °1.  Letrozole 2.5 mg daily times 5 years started February 2019, switched to anastrozole 04/27/2018 °2. Osteoporosis: Bone density done recently showed a T score of -2.7 suggestive of osteoporosis.  Currently on Fosamax with calcium and vitamin D °  °Anastrozole toxicities:  Hair loss °Hot flashes better on anastrozole. °  °Breast Cancer Surveillance:  °1. Breast Exam: 09/07/2021 °2. Mammogram right breast:  12/14/2020: Benign breast density category B °  °Return to clinic in 1 year for follow-up °

## 2021-09-07 NOTE — Progress Notes (Signed)
Patient Care Team: Shirline Frees, MD as PCP - General (Family Medicine) Nicholas Lose, MD as Consulting Physician (Hematology and Oncology) Excell Seltzer, MD (Inactive) as Consulting Physician (General Surgery) Delice Bison Charlestine Massed, NP as Nurse Practitioner (Hematology and Oncology)  DIAGNOSIS:  Encounter Diagnosis  Name Primary?   Malignant neoplasm of upper-inner quadrant of left breast in female, estrogen receptor positive ([place])     SUMMARY OF ONCOLOGIC HISTORY: Oncology History  Malignant neoplasm of upper-inner quadrant of left breast in female, estrogen receptor positive ([place])  10/13/2017 Initial Diagnosis   Left breast palpable mass and calcifications which are lateral and anterior to the mass, ultrasound 1.5 cm at 9:00, satellite mass, 3 additional masses 5 mm, 4 mm, 4 mm not biopsied.  Biopsy of calcifications: DCIS ER 100%, PR 100%; biopsy of the mass IDC grade 2 with DCIS ER 90%, PR 100%, Ki-67 30%, HER-2 negative ratio 1.83, T1 cN0 stage I a clinical stage   10/31/2017 Surgery   Left breast mastectomy ([facility]): IDC, grade 2, 1.6 cm, margins negative, no SLNB   10/2017 -  Anti-estrogen oral therapy   Letrozole daily switched to anastrozole 04/27/2018 due to hair loss   02/04/2018 Cancer Staging   Staging form: Breast, AJCC 8th Edition - Pathologic: Stage IA (pT1c, pN0, cM0, G2, ER+, PR+, HER2-) - Signed by Gardenia Phlegm, NP on 02/04/2018      CHIEF COMPLIANT: Follow-up of breast cancer on anastrozole  INTERVAL HISTORY: Jamie Beltran is a 86 year old with above-mentioned history of left breast cancer treated with mastectomy and is currently on oral antiestrogen therapy with anastrozole.  She has been on antiestrogen therapy since February 2019.  She has completed nearly 4 years of therapy.   She is tolerating anastrozole fairly well.  Patient felt a lump underneath the left axilla.  She felt it 2 weeks ago.   ALLERGIES:  is allergic to codeine,  penicillins, adhesive [tape], and protonix [pantoprazole sodium].  MEDICATIONS:  Current Outpatient Medications  Medication Sig Dispense Refill   anastrozole (ARIMIDEX) 1 MG tablet TAKE 1 TABLET BY MOUTH  DAILY 90 tablet 3   aspirin EC 81 MG EC tablet Take 1 tablet (81 mg total) by mouth daily. Swallow whole. 30 tablet 1   Cholecalciferol (VITAMIN D) 50 MCG (2000 UT) tablet Take 2,000 Units by mouth daily.     conjugated estrogens (PREMARIN) vaginal cream Place 1 Applicatorful vaginally daily as needed (itching).     cycloSPORINE (RESTASIS) 0.05 % ophthalmic emulsion Place 1 drop into both eyes 2 (two) times daily.     diclofenac sodium (VOLTAREN) 1 % GEL Apply 2 g topically daily as needed (pain).      docusate sodium (COLACE) 100 MG capsule Take 1 capsule (100 mg total) by mouth 2 (two) times daily as needed for mild constipation. 40 capsule 0   escitalopram (LEXAPRO) 20 MG tablet Take 20 mg by mouth daily.     famotidine (PEPCID) 20 MG tablet Take 20 mg by mouth at bedtime.     gabapentin (NEURONTIN) 300 MG capsule Take 300 mg by mouth 2 (two) times daily.      HYDROcodone-acetaminophen (NORCO) 10-325 MG tablet Take 1 tablet by mouth every 4 (four) hours as needed for moderate pain. 42 tablet 0   hydrocortisone 2.5 % cream Apply 1 application topically 2 (two) times daily as needed (irritation).     levothyroxine (SYNTHROID) 88 MCG tablet Take 88 mcg by mouth daily before breakfast.     Magnesium  250 MG TABS Take 250 mg by mouth daily.     omeprazole (PRILOSEC) 40 MG capsule Take 40 mg by mouth daily.     polyethylene glycol (MIRALAX / GLYCOLAX) 17 g packet Take 17 g by mouth daily as needed for mild constipation. 14 each 0   No current facility-administered medications for this visit.    PHYSICAL EXAMINATION: ECOG PERFORMANCE STATUS: 1 - Symptomatic but completely ambulatory  Vitals:   09/07/21 1411  BP: 128/63  Pulse: 76  Resp: 16  Temp: 97.6 F (36.4 C)  SpO2: 96%   Filed  Weights   09/07/21 1411  Weight: 149 lb (67.6 kg)     BREAST:  (exam performed in the presence of a chaperone)  LABORATORY DATA:  I have reviewed the data as listed CMP Latest Ref Rng & Units 04/27/2021 04/18/2021 10/22/2017  Glucose 70 - 99 mg/dL 96 117(H) 127  BUN 8 - 23 mg/dL 5(L) 13 14  Creatinine 0.44 - 1.00 mg/dL 0.76 0.80 0.94  Sodium 135 - 145 mmol/L 141 137 139  Potassium 3.5 - 5.1 mmol/L 4.1 4.0 4.3  Chloride 98 - 111 mmol/L 108 105 103  CO2 22 - 32 mmol/L _0 Calcium 8.9 - 10.3 mg/dL 8.7(L) 9.7 9.6  Total Protein 6.4 - 8.3 g/dL - - 7.4  Total Bilirubin 0.2 - 1.2 mg/dL - - 1.0  Alkaline Phos 40 - 150 U/L - - 80  AST 5 - 34 U/L - - 20  ALT 0 - 55 U/L - - 23    Lab Results  Component Value Date   WBC 9.3 04/28/2021   HGB 9.5 (L) 04/28/2021   HCT 29.7 (L) 04/28/2021   MCV 88.1 04/28/2021   PLT 172 04/28/2021   NEUTROABS 3.6 10/22/2017    ASSESSMENT & PLAN:  Malignant neoplasm of upper-inner quadrant of left breast in female, estrogen receptor positive ([place]) Left Mastectomy: 10/31/2017: IDC grade 2 1.6 cm 0/3 lymph nodes negative, ER PR positive HER-2 negative with Ki-67 of 30%, T1 cN0 stage I a   Recommendation: 1.  Letrozole 2.5 mg daily times 5 years started February 2019, switched to anastrozole 04/27/2018 2. Osteoporosis: Bone density done recently showed a T score of -2.7 suggestive of osteoporosis.  Currently on Fosamax with calcium and vitamin D   Anastrozole toxicities:  Hair loss Hot flashes better on anastrozole.   Breast Cancer Surveillance:  1. Breast Exam: 09/07/2021  lump in the left axilla: Plan for ultrasound of the left axilla.  I discussed with her that if the ultrasound is benign then she does not have anything to worry about.  If the ultrasound shows an abnormality like a large lymph node then she will need a biopsy.  2. Mammogram right breast:  12/14/2020: Benign breast density category B   The lump is benign then we can see her back in 1  year for follow-up.  That would finish 5 years of antiestrogen therapy.     No orders of the defined types were placed in this encounter.  The patient has a good understanding of the overall plan. she agrees with it. she will call with any problems that may develop before the next visit here. Total time spent: 30 mins including face to face time and time spent for planning, charting and co-ordination of care   Harriette Ohara, MD 09/07/21

## 2021-09-07 NOTE — Progress Notes (Signed)
Order for [place] placed, appointment scheduled for 09/13/21 at 3;40pm. Pt notified and verbalized understanding. No further questions at this time.

## 2021-09-13 ENCOUNTER — Other Ambulatory Visit: Payer: PPO

## 2021-10-08 DIAGNOSIS — E7800 Pure hypercholesterolemia, unspecified: Secondary | ICD-10-CM | POA: Diagnosis not present

## 2021-10-08 DIAGNOSIS — C50912 Malignant neoplasm of unspecified site of left female breast: Secondary | ICD-10-CM | POA: Diagnosis not present

## 2021-10-08 DIAGNOSIS — F320 Major depressive disorder, single episode, mild: Secondary | ICD-10-CM | POA: Diagnosis not present

## 2021-10-08 DIAGNOSIS — K219 Gastro-esophageal reflux disease without esophagitis: Secondary | ICD-10-CM | POA: Diagnosis not present

## 2021-10-08 DIAGNOSIS — M170 Bilateral primary osteoarthritis of knee: Secondary | ICD-10-CM | POA: Diagnosis not present

## 2021-10-08 DIAGNOSIS — E039 Hypothyroidism, unspecified: Secondary | ICD-10-CM | POA: Diagnosis not present

## 2021-10-08 DIAGNOSIS — G8929 Other chronic pain: Secondary | ICD-10-CM | POA: Diagnosis not present

## 2021-10-17 ENCOUNTER — Other Ambulatory Visit: Payer: PPO

## 2021-11-26 ENCOUNTER — Ambulatory Visit
Admission: RE | Admit: 2021-11-26 | Discharge: 2021-11-26 | Disposition: A | Discharge status: Home / Self Care | Payer: PPO | Source: Ambulatory Visit | Attending: Family Medicine | Admitting: Family Medicine

## 2021-11-26 ENCOUNTER — Other Ambulatory Visit: Payer: Self-pay | Admitting: Family Medicine

## 2021-11-26 DIAGNOSIS — F320 Major depressive disorder, single episode, mild: Secondary | ICD-10-CM | POA: Diagnosis not present

## 2021-11-26 DIAGNOSIS — E039 Hypothyroidism, unspecified: Secondary | ICD-10-CM | POA: Diagnosis not present

## 2021-11-26 DIAGNOSIS — G8929 Other chronic pain: Secondary | ICD-10-CM

## 2021-11-26 DIAGNOSIS — K219 Gastro-esophageal reflux disease without esophagitis: Secondary | ICD-10-CM | POA: Diagnosis not present

## 2021-11-26 DIAGNOSIS — E7800 Pure hypercholesterolemia, unspecified: Secondary | ICD-10-CM | POA: Diagnosis not present

## 2021-11-26 DIAGNOSIS — N390 Urinary tract infection, site not specified: Secondary | ICD-10-CM | POA: Diagnosis not present

## 2021-11-26 DIAGNOSIS — C50912 Malignant neoplasm of unspecified site of left female breast: Secondary | ICD-10-CM | POA: Diagnosis not present

## 2021-11-26 DIAGNOSIS — I700 Atherosclerosis of aorta: Secondary | ICD-10-CM | POA: Diagnosis not present

## 2021-11-26 DIAGNOSIS — E038 Other specified hypothyroidism: Secondary | ICD-10-CM | POA: Diagnosis not present

## 2021-11-26 DIAGNOSIS — M549 Dorsalgia, unspecified: Secondary | ICD-10-CM | POA: Diagnosis not present

## 2021-11-26 DIAGNOSIS — M2578 Osteophyte, vertebrae: Secondary | ICD-10-CM | POA: Diagnosis not present

## 2021-12-04 ENCOUNTER — Ambulatory Visit
Admission: RE | Admit: 2021-12-04 | Discharge: 2021-12-04 | Disposition: A | Discharge status: Home / Self Care | Payer: PPO | Source: Ambulatory Visit | Attending: Hematology and Oncology | Admitting: Hematology and Oncology

## 2021-12-04 ENCOUNTER — Other Ambulatory Visit: Payer: Self-pay | Admitting: Hematology and Oncology

## 2021-12-04 ENCOUNTER — Other Ambulatory Visit: Payer: Self-pay

## 2021-12-04 DIAGNOSIS — C50212 Malignant neoplasm of upper-inner quadrant of left female breast: Secondary | ICD-10-CM

## 2021-12-04 DIAGNOSIS — M79622 Pain in left upper arm: Secondary | ICD-10-CM | POA: Diagnosis not present

## 2022-01-22 DIAGNOSIS — L918 Other hypertrophic disorders of the skin: Secondary | ICD-10-CM | POA: Diagnosis not present

## 2022-01-22 DIAGNOSIS — L309 Dermatitis, unspecified: Secondary | ICD-10-CM | POA: Diagnosis not present

## 2022-01-22 DIAGNOSIS — L570 Actinic keratosis: Secondary | ICD-10-CM | POA: Diagnosis not present

## 2022-01-28 DIAGNOSIS — K625 Hemorrhage of anus and rectum: Secondary | ICD-10-CM | POA: Diagnosis not present

## 2022-05-06 DIAGNOSIS — G894 Chronic pain syndrome: Secondary | ICD-10-CM | POA: Diagnosis not present

## 2022-05-06 DIAGNOSIS — K219 Gastro-esophageal reflux disease without esophagitis: Secondary | ICD-10-CM | POA: Diagnosis not present

## 2022-05-06 DIAGNOSIS — E7800 Pure hypercholesterolemia, unspecified: Secondary | ICD-10-CM | POA: Diagnosis not present

## 2022-05-06 DIAGNOSIS — F320 Major depressive disorder, single episode, mild: Secondary | ICD-10-CM | POA: Diagnosis not present

## 2022-05-09 ENCOUNTER — Emergency Department (HOSPITAL_BASED_OUTPATIENT_CLINIC_OR_DEPARTMENT_OTHER): Payer: PPO

## 2022-05-09 ENCOUNTER — Encounter (HOSPITAL_BASED_OUTPATIENT_CLINIC_OR_DEPARTMENT_OTHER): Payer: Self-pay | Admitting: *Deleted

## 2022-05-09 ENCOUNTER — Emergency Department (HOSPITAL_BASED_OUTPATIENT_CLINIC_OR_DEPARTMENT_OTHER)
Admission: EM | Admit: 2022-05-09 | Discharge: 2022-05-09 | Disposition: A | Discharge status: Home / Self Care | Payer: PPO | Attending: Emergency Medicine | Admitting: Emergency Medicine

## 2022-05-09 ENCOUNTER — Other Ambulatory Visit: Payer: Self-pay

## 2022-05-09 DIAGNOSIS — Z7982 Long term (current) use of aspirin: Secondary | ICD-10-CM | POA: Insufficient documentation

## 2022-05-09 DIAGNOSIS — R0602 Shortness of breath: Secondary | ICD-10-CM | POA: Insufficient documentation

## 2022-05-09 DIAGNOSIS — R079 Chest pain, unspecified: Secondary | ICD-10-CM | POA: Diagnosis not present

## 2022-05-09 DIAGNOSIS — R0609 Other forms of dyspnea: Secondary | ICD-10-CM | POA: Diagnosis not present

## 2022-05-09 DIAGNOSIS — Z86718 Personal history of other venous thrombosis and embolism: Secondary | ICD-10-CM | POA: Diagnosis not present

## 2022-05-09 LAB — BRAIN NATRIURETIC PEPTIDE: B Natriuretic Peptide: 46.3 pg/mL (ref 0.0–100.0)

## 2022-05-09 LAB — CBC
HCT: 35.7 % — ABNORMAL LOW (ref 36.0–46.0)
Hemoglobin: 11.7 g/dL — ABNORMAL LOW (ref 12.0–15.0)
MCH: 28.1 pg (ref 26.0–34.0)
MCHC: 32.8 g/dL (ref 30.0–36.0)
MCV: 85.8 fL (ref 80.0–100.0)
Platelets: 232 10*3/uL (ref 150–400)
RBC: 4.16 MIL/uL (ref 3.87–5.11)
RDW: 15 % (ref 11.5–15.5)
WBC: 7.2 10*3/uL (ref 4.0–10.5)
nRBC: 0 % (ref 0.0–0.2)

## 2022-05-09 LAB — BASIC METABOLIC PANEL
Anion gap: 5 (ref 5–15)
BUN: 20 mg/dL (ref 8–23)
CO2: 28 mmol/L (ref 22–32)
Calcium: 9.8 mg/dL (ref 8.9–10.3)
Chloride: 106 mmol/L (ref 98–111)
Creatinine, Ser: 0.92 mg/dL (ref 0.44–1.00)
GFR, Estimated: >60 mL/min (ref >60)
Glucose, Bld: 94 mg/dL (ref 70–99)
Potassium: 4.2 mmol/L (ref 3.5–5.1)
Sodium: 139 mmol/L (ref 135–145)

## 2022-05-09 LAB — TROPONIN I (HIGH SENSITIVITY)
Troponin I (High Sensitivity): 3 ng/L (ref <18)
Troponin I (High Sensitivity): 4 ng/L (ref <18)

## 2022-05-09 MED ORDER — IOHEXOL 350 MG/ML SOLN
80.0000 mL | Freq: Once | INTRAVENOUS | Status: AC | PRN
  Administered 2022-05-09: 80 mL via INTRAVENOUS

## 2022-05-09 MED ORDER — NITROGLYCERIN 0.4 MG SL SUBL
0.4000 mg | SUBLINGUAL_TABLET | SUBLINGUAL | Status: DC | PRN
  Filled 2022-05-09: qty 1

## 2022-05-09 NOTE — ED Notes (Signed)
Pt denies chest pain and shob at this time.

## 2022-05-09 NOTE — ED Provider Notes (Signed)
[facility] EMERGENCY DEPARTMENT Provider Note   CSN: [id number] Arrival date & time: 05/09/22  1715     History  Chief Complaint  Patient presents with   Shortness of Breath    Jamie Beltran is a 87 y.o. female.  87 yo F with a chief complaints of shortness of breath on exertion.  This been going on for about a week.  She had an episode 1 day where she had some fairly significant chest pain that radiated into her neck and down the left arm.  She said this was very short-lived.  Has not had any exertional chest discomfort as far she will report.  Has a history of a DVT but no history of PE.  She denies cough congestion or fever.  Denies trauma to the area.  Nothing seems to make this better or worse.  She went to see her family doctor today and they suggested she come to the ED for evaluation.  She denies history of MI, denies history of hypertension hyperlipidemia diabetes or smoking.  Her mom had an MI in her 48s at least that is what was documented on her death certificate's cause of death.  Has a history of DVT.  No longer on anticoagulation.   Shortness of Breath      Home Medications Prior to Admission medications   Medication Sig Start Date End Date Taking? Authorizing Provider  anastrozole (ARIMIDEX) 1 MG tablet TAKE 1 TABLET BY MOUTH  DAILY 03/29/20   Nicholas Lose, MD  aspirin EC 81 MG EC tablet Take 1 tablet (81 mg total) by mouth daily. Swallow whole. 05/02/21   Cecilie Kicks, PA-C  Cholecalciferol (VITAMIN D) 50 MCG (2000 UT) tablet Take 2,000 Units by mouth daily.    [provider]  conjugated estrogens (PREMARIN) vaginal cream Place 1 Applicatorful vaginally daily as needed (itching).    [provider]  cycloSPORINE (RESTASIS) 0.05 % ophthalmic emulsion Place 1 drop into both eyes 2 (two) times daily.    [provider]  diclofenac sodium (VOLTAREN) 1 % GEL Apply 2 g topically daily as needed (pain).     [provider]  docusate sodium (COLACE) 100 MG capsule Take 1 capsule (100 mg total) by mouth 2 (two) times daily as needed for mild constipation. 04/26/21   Cecilie Kicks, PA-C  escitalopram (LEXAPRO) 20 MG tablet Take 20 mg by mouth daily. 11/11/19   [provider]  famotidine (PEPCID) 20 MG tablet Take 20 mg by mouth at bedtime.    [provider]  gabapentin (NEURONTIN) 300 MG capsule Take 300 mg by mouth 2 (two) times daily.     [provider]  HYDROcodone-acetaminophen (NORCO) 10-325 MG tablet Take 1 tablet by mouth every 4 (four) hours as needed for moderate pain. 04/26/21   Cecilie Kicks, PA-C  hydrocortisone 2.5 % cream Apply 1 application topically 2 (two) times daily as needed (irritation).    [provider]  levothyroxine (SYNTHROID) 88 MCG tablet Take 88 mcg by mouth daily before breakfast.    [provider]  Magnesium 250 MG TABS Take 250 mg by mouth daily.    [provider]  omeprazole (PRILOSEC) 40 MG capsule Take 40 mg by mouth daily.    [provider]  polyethylene glycol (MIRALAX / GLYCOLAX) 17 g packet Take 17 g by mouth daily as needed for mild constipation. 04/26/21   Cecilie Kicks, PA-C      Allergies  Codeine, Penicillins, Adhesive [tape], and Protonix [pantoprazole sodium]    Review of Systems   Review of Systems  Respiratory:  Positive for shortness of breath.     Physical Exam Updated Vital Signs BP (!) 143/63   Pulse 72   Resp 13   SpO2 97%  Physical Exam Vitals and nursing note reviewed.  Constitutional:      General: She is not in acute distress.    Appearance: She is well-developed. She is not diaphoretic.  HENT:     Head: Normocephalic and atraumatic.  Eyes:     Pupils: Pupils are equal, round, and reactive to light.  Cardiovascular:     Rate and Rhythm: Normal rate and regular rhythm.     Heart sounds: No murmur heard.    No friction rub. No gallop.  Pulmonary:      Effort: Pulmonary effort is normal.     Breath sounds: No wheezing or rales.  Chest:     Comments: No reported discomfort with palpation of the left anterior chest wall. Abdominal:     General: There is no distension.     Palpations: Abdomen is soft.     Tenderness: There is no abdominal tenderness.  Musculoskeletal:        General: No tenderness.     Cervical back: Normal range of motion and neck supple.  Skin:    General: Skin is warm and dry.  Neurological:     Mental Status: She is alert and oriented to person, place, and time.  Psychiatric:        Behavior: Behavior normal.     ED Results / Procedures / Treatments   Labs (all labs ordered are listed, but only abnormal results are displayed) Labs Reviewed  CBC - Abnormal; Notable for the following components:      Result Value   Hemoglobin 11.7 (*)    HCT 35.7 (*)    All other components within normal limits  BASIC METABOLIC PANEL  BRAIN NATRIURETIC PEPTIDE  TROPONIN I (HIGH SENSITIVITY)  TROPONIN I (HIGH SENSITIVITY)    EKG EKG Interpretation  Date/Time:  Thursday May 09 2022 17:30:17 EDT Ventricular Rate:  69 PR Interval:  171 QRS Duration: 93 QT Interval:  422 QTC Calculation: 453 R Axis:   61 Text Interpretation: Sinus rhythm Abnormal R-wave progression, early transition flipped t eaves in lead III and v3 no longer present Otherwise no significant change Confirmed by Deno Etienne [phone]) on 05/09/2022 5:41:26 PM  Radiology CT Angio Chest PE W and/or Wo Contrast  Result Date: 05/09/2022 CLINICAL DATA:  87 year old female with shortness of breath and chest pain EXAM: CT ANGIOGRAPHY CHEST WITH CONTRAST TECHNIQUE: Multidetector CT imaging of the chest was performed using the standard protocol during bolus administration of intravenous contrast. Multiplanar CT image reconstructions and MIPs were obtained to evaluate the vascular anatomy. RADIATION DOSE REDUCTION: This exam was performed according to the  departmental dose-optimization program which includes automated exposure control, adjustment of the mA and/or kV according to patient size and/or use of iterative reconstruction technique. CONTRAST:  62m OMNIPAQUE IOHEXOL 350 MG/ML SOLN COMPARISON:  Radiographs earlier today and CT chest 12/20/2016 FINDINGS: Cardiovascular: Satisfactory opacification of the pulmonary arteries to the segmental level. No evidence of pulmonary embolism. Normal heart size. No pericardial effusion. Aortic atherosclerotic calcification. Mediastinum/Nodes: Calcified mediastinal and hilar lymph nodes. Unremarkable thyroid. Small hiatal hernia. Lungs/Pleura: Scarring/atelectasis in the lower lobes. Biapical scarring. No focal consolidation, pleural effusion, or pneumothorax. Scattered mucous plugging. Stable 6 mm  left apical subpleural nodule. Additional tiny nodules in the left upper chest are also stable. Upper Abdomen: Cholecystectomy. Splenic granulomas. No acute abnormality in the upper abdomen Musculoskeletal: Postoperative changes left breast. No acute or significant osseous findings. Review of the MIP images confirms the above findings. IMPRESSION: Negative for acute pulmonary embolism. Stable chronic lung changes. No new intrathoracic abnormality. Aortic Atherosclerosis (ICD10-I70.0). Electronically Signed   By: Placido Sou M.D.   On: 05/09/2022 21:01   DG Chest 2 View  Result Date: 05/09/2022 CLINICAL DATA:  Left chest pain EXAM: CHEST - 2 VIEW COMPARISON:  Chest x-ray dated May 28, 2016 FINDINGS: The heart size and mediastinal contours are within normal limits. Stable biapical pleural-parenchymal scarring. Calcified right hilar lymph node, unchanged when compared to prior. Both lungs are clear. The visualized skeletal structures are unremarkable. IMPRESSION: No active cardiopulmonary disease. Electronically Signed   By: Yetta Glassman M.D.   On: 05/09/2022 18:15    Procedures Procedures    Medications Ordered  in ED Medications  nitroGLYCERIN (NITROSTAT) SL tablet 0.4 mg (has no administration in time range)  iohexol (OMNIPAQUE) 350 MG/ML injection 80 mL (80 mLs Intravenous Contrast Given 05/09/22 2038)    ED Course/ Medical Decision Making/ A&P                           Medical Decision Making Amount and/or Complexity of Data Reviewed Labs: ordered. Radiology: ordered.  Risk Prescription drug management.   87 yo F with a chief complaints of left-sided chest pain that radiates down the arm.  This occurred severely on Tuesday.  She has been having pain for about a week now.  Has had some shortness of breath on exertion as well.  She has a history of a DVT.  Chest x-ray independently interpreted by me without focal obstructive pneumothorax.  Patient has 2 troponins that are negative.  No significant anemia no significant electrolyte abnormality I obtained a CT angiogram of the chest to evaluate for pulmonary embolism, this was negative no occult pneumonia.  We will discharge the patient home.  PCP follow-up.  9:12 PM:  I have discussed the diagnosis/risks/treatment options with the patient.  Evaluation and diagnostic testing in the emergency department does not suggest an emergent condition requiring admission or immediate intervention beyond what has been performed at this time.  They will follow up with  PCP. We also discussed returning to the ED immediately if new or worsening sx occur. We discussed the sx which are most concerning (e.g., sudden worsening pain, fever, inability to tolerate by mouth) that necessitate immediate return. Medications administered to the patient during their visit and any new prescriptions provided to the patient are listed below.  Medications given during this visit Medications  nitroGLYCERIN (NITROSTAT) SL tablet 0.4 mg (has no administration in time range)  iohexol (OMNIPAQUE) 350 MG/ML injection 80 mL (80 mLs Intravenous Contrast Given 05/09/22 2038)     The  patient appears reasonably screen and/or stabilized for discharge and I doubt any other medical condition or other [facility] requiring further screening, evaluation, or treatment in the ED at this time prior to discharge.          Final Clinical Impression(s) / ED Diagnoses Final diagnoses:  SOB (shortness of breath)    Rx / DC Orders ED Discharge Orders     None         Deno Etienne, DO 05/09/22 2112

## 2022-05-09 NOTE — Discharge Instructions (Signed)
Please call your family doctor in the morning and discussed with them your visit here to the emergency department.  Please return to the emergency department for worsening difficulty worsening chest pain especially upon exercise or going up stairs.

## 2022-05-09 NOTE — ED Triage Notes (Signed)
Was being seen at primary care MD today for BP issues, pt became very shortness of breath when ambulating in office, pt states she has also recently been having some chest pain along with shortness of breath. Left ant chest pain this past tues, radiated to left neck and left arm. Took one (1) Baby ASA and pain subsided

## 2022-05-14 DIAGNOSIS — C50912 Malignant neoplasm of unspecified site of left female breast: Secondary | ICD-10-CM | POA: Diagnosis not present

## 2022-05-14 DIAGNOSIS — F320 Major depressive disorder, single episode, mild: Secondary | ICD-10-CM | POA: Diagnosis not present

## 2022-05-14 DIAGNOSIS — G8929 Other chronic pain: Secondary | ICD-10-CM | POA: Diagnosis not present

## 2022-05-14 DIAGNOSIS — R0609 Other forms of dyspnea: Secondary | ICD-10-CM | POA: Diagnosis not present

## 2022-05-14 DIAGNOSIS — E039 Hypothyroidism, unspecified: Secondary | ICD-10-CM | POA: Diagnosis not present

## 2022-05-14 DIAGNOSIS — K219 Gastro-esophageal reflux disease without esophagitis: Secondary | ICD-10-CM | POA: Diagnosis not present

## 2022-05-23 NOTE — Progress Notes (Signed)
Cardiology Office Note:    Date:  05/24/2022   ID:  Jamie Beltran, DOB 01-01-1935, MRN [id number]  PCP:  Shirline Frees, [place] Prov[facility] Cardiologist:  None     Referring MD: Shirline Frees, MD   Chief Complaint  Patient presents with   New Patient (Initial Visit)   Headache   Chest Pain   Shortness of Breath   Edema    History of Present Illness:    Jamie Beltran is a 87 y.o. female is seen at the request of Dr Kenton Kingfisher for evaluation of DOE and near syncope. She has a history of breast CA, GERD, and HLD. She was seen in the past by our group. Last seen in 2017. Myoview in 2012 was normal. Event monitor in 2015 was benign. In 2017 Echo was normal. 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   Recently she has experienced "spells" where she suddenly gets a sinking feeling and feels like she might pass out. She sees stars. When she has been able to check her BP it is as low as 90/54. She also gets acutely SOB. She has been monitoring her BP and it is quite labile with some high readings in the 150-165 range. Sometimes gets HA. On 8/17 was seen in ED with acute SOB. Delta troponin and BNP normal. Ecg no acute change. CT chest no evidence of PE. I reviewed study and there was no coronary calcification either.   Past Medical History:  Diagnosis Date   Basal cell carcinoma    Blood in stool    Breast cancer ([place]) 09/2017   left breast   Chronic back pain    Colonic polyp    COVID 03/24/2021   Diverticulitis    Diverticulosis    Esophageal reflux    Family history of adverse reaction to anesthesia    Sister has N & V   Fibrocystic breast disease    GERD (gastroesophageal reflux disease) 05/29/2016   Hiatal hernia    History of kidney stones    Hyperlipidemia 05/29/2016   Hypothyroidism 05/29/2016   Migraine 05/29/2016   Migraine headache    OA (osteoarthritis)    back, hips,  knees   Other and unspecified hyperlipidemia     Past Surgical History:  Procedure Laterality Date   ABDOMINAL HYSTERECTOMY     [place] N/A 05/29/2016   Procedure: Left Heart Cath and Coronary Angiography;  Surgeon: Sherren Mocha, MD;  Location: [facility];  Service: Cardiovascular;  Laterality: N/A;   CATARACT EXTRACTION, BILATERAL     CHOLECYSTECTOMY  2002   COLONOSCOPY  04/08   COLONOSCOPY N/A 05/11/2020   Procedure: DIAGNOSTIC COLONOSCOPY;  Surgeon: Jesusita Oka, MD;  Location: [facility];  Service: General;  Laterality: N/A;   ESOPHAGOGASTRODUODENOSCOPY  9/06, 4/08   FOOT SURGERY  1990   HEEL SPUR EXCISION  2001   KNEE ARTHROSCOPY Bilateral 2006, 2013   LUMBAR LAMINECTOMY/DECOMPRESSION MICRODISCECTOMY N/A 04/26/2021   Procedure: Microlumbar decompression Lumbar four-five, lateral mass fusion using autologus allograft bone;  Surgeon: Susa Day, MD;  Location: [place];  Service: Orthopedics;  Laterality: N/A;   MASTECTOMY Left 09/23/2017   TOTAL ABDOMINAL HYSTERECTOMY W/ BILATERAL SALPINGOOPHORECTOMY  1981   TOTAL MASTECTOMY Left 10/31/2017   Procedure: LEFT TOTAL MASTECTOMY;  Surgeon: Excell Seltzer, MD;  Location: [facility];  Service: General;  Laterality: Left;   VEIN LIGATION Left 1980    Current Medications: Current Meds  Medication Sig   anastrozole (ARIMIDEX) 1 MG tablet TAKE 1 TABLET BY MOUTH  DAILY   aspirin EC 81 MG EC tablet Take 1 tablet (81 mg total) by mouth daily. Swallow whole.   Cholecalciferol (VITAMIN D) 50 MCG (2000 UT) tablet Take 2,000 Units by mouth daily.   conjugated estrogens (PREMARIN) vaginal cream Place 1 Applicatorful vaginally daily as needed (itching).   cycloSPORINE (RESTASIS) 0.05 % ophthalmic emulsion Place 1 drop into both eyes 2 (two) times daily.   diclofenac sodium (VOLTAREN) 1 % GEL Apply 2 g topically daily as needed (pain).    docusate sodium (COLACE) 100 MG  capsule Take 1 capsule (100 mg total) by mouth 2 (two) times daily as needed for mild constipation.   escitalopram (LEXAPRO) 20 MG tablet Take 20 mg by mouth daily.   famotidine (PEPCID) 20 MG tablet Take 20 mg by mouth at bedtime.   gabapentin (NEURONTIN) 300 MG capsule Take 300 mg by mouth 2 (two) times daily.    HYDROcodone-acetaminophen (NORCO) 10-325 MG tablet Take 1 tablet by mouth every 4 (four) hours as needed for moderate pain.   hydrocortisone 2.5 % cream Apply 1 application topically 2 (two) times daily as needed (irritation).   levothyroxine (SYNTHROID) 88 MCG tablet Take 88 mcg by mouth daily before breakfast.   Magnesium 250 MG TABS Take 250 mg by mouth daily.   omeprazole (PRILOSEC) 40 MG capsule Take 40 mg by mouth daily.   polyethylene glycol (MIRALAX / GLYCOLAX) 17 g packet Take 17 g by mouth daily as needed for mild constipation.     Allergies:   Codeine, Penicillins, Adhesive [tape], and Protonix [pantoprazole sodium]   Social History   Socioeconomic History   Marital status: Married    Spouse name: Don   Number of children: 3   Years of education: HS   Highest education level: Not on file  Occupational History   Occupation: retired   Occupation: RETIRED    Employer: [employer]  Tobacco Use   Smoking status: Former    Packs/day: 1.00    Years: 20.00    Total pack years: 20.00    Types: Cigarettes    Quit date: 09/23/1980    Years since quitting: 41.6   Smokeless tobacco: Never  Vaping Use   Vaping Use: Never used  Substance and Sexual Activity   Alcohol use: Yes    Comment: rarely   Drug use: No   Sexual activity: Not Currently  Other Topics Concern   Not on file  Social History Narrative   Patient lives at home with spouse.   Caffeine use: 1-2 cups daily   Social Determinants of Health   Financial Resource Strain: Not on file  Food Insecurity: Not on file  Transportation Needs: Not on file  Physical Activity: Not on file  Stress: Not on file   Social Connections: Not on file     Family History: The patient's family history includes Breast cancer in her maternal aunt; Heart attack in her mother; Hypertension in her father; Kidney Stones in her sister and sister; Lung cancer in her maternal aunt.  ROS:   Please see the history of present illness.     All other systems reviewed and are negative.  EKGs/Labs/Other Studies Reviewed:    The following studies were reviewed today: Echo 05/29/16: Study Conclusions   - Left ventricle: The cavity size was normal. Systolic  function was    normal. The estimated ejection fraction was in the range of 60%    to 65%. Wall motion was normal; there were no regional wall    motion abnormalities. There was an increased relative    contribution of atrial contraction to ventricular filling.    Doppler parameters are consistent with abnormal left ventricular    relaxation (grade 1 diastolic dysfunction).  - Aortic valve: Trileaflet; normal thickness, mildly calcified    leaflets.   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  The ekg ordered 05/09/22  demonstrates NSR rate 69. Normal. I have personally reviewed and interpreted this study.   Recent Labs: 05/09/2022: B Natriuretic Peptide 46.3; BUN 20; Creatinine, Ser 0.92; Hemoglobin 11.7; Platelets 232; Potassium 4.2; Sodium 139  Recent Lipid Panel    Component Value Date/Time   CHOL 178 05/29/2016 0256   TRIG 212 (H) 05/29/2016 0256   HDL 42 05/29/2016 0256   CHOLHDL 4.2 05/29/2016 0256   VLDL 42 (H) 05/29/2016 0256   LDLCALC 94 05/29/2016 0256   Dated 11/26/21: cholesterol 179, triglycerides 183, HDL 47, LDL 100, CMET and TSH normal.   Risk Assessment/Calculations:           Physical Exam:    VS:  BP (!)  140/54 (BP Location: Left Arm, Patient Position: Sitting, Cuff Size: Normal)   Pulse 87   Ht '5\' 4"'$  (1.626 m)   Wt 158 lb (71.7 kg)   BMI 27.12 kg/m     Wt Readings from Last 3 Encounters:  05/24/22 158 lb (71.7 kg)  09/07/21 149 lb (67.6 kg)  04/28/21 149 lb 14.6 oz (68 kg)     GEN:  Well nourished, well developed in no acute distress HEENT: Normal NECK: No JVD; No carotid bruits LYMPHATICS: No lymphadenopathy CARDIAC: RRR, no murmurs, rubs, gallops RESPIRATORY:  Clear to auscultation without rales, wheezing or rhonchi  ABDOMEN: Soft, non-tender, non-distended MUSCULOSKELETAL:  No edema; No deformity  SKIN: Warm and dry. Chronic varicosities.  NEUROLOGIC:  Alert and oriented x 3 PSYCHIATRIC:  Normal affect   ASSESSMENT:    1. Near syncope   2. Labile blood pressure    PLAN:    In order of problems listed above:  Patient has symptoms of hemodynamic instability most likely related with transient drop in BP either from orthostasis or neurocardiogenic cause. I would like to make sure she is not having an arrhythmia. No need for further ischemic work up. Discussed avoidance by wearing support hose, maintaining good hydration and liberalizing salt intake. Should avoid treating elevated BP readings since this will just lead to more hypotension. Stressed importance of heeding symptoms and getting down quickly. Will have her wear a 2 week event monitor. Will arrange follow up with APP after.            Medication Adjustments/Labs and Tests Ordered: Current medicines are reviewed at length with the patient today.  Concerns regarding medicines are outlined above.  Orders Placed This Encounter  Procedures   LONG TERM MONITOR (3-14 DAYS)   No orders of the defined types were placed in this encounter.   Patient Instructions  We will have you wear a monitor for 2 weeks.   Wear compression hose.  If you feel lightheaded  immediately get down- either sitting or lying.   You can  be liberal with salt intake.    Schedule follow up appointment with Dr.Zerline Melchior's PA   Signed, Rhylin Venters Martinique, MD  05/24/2022 4:16 PM    [place]

## 2022-05-24 ENCOUNTER — Encounter: Payer: Self-pay | Admitting: Cardiology

## 2022-05-24 ENCOUNTER — Ambulatory Visit: Payer: PPO | Attending: Cardiology | Admitting: Cardiology

## 2022-05-24 VITALS — BP 140/54 | HR 87 | Ht 64.0 in | Wt 158.0 lb

## 2022-05-24 DIAGNOSIS — R55 Syncope and collapse: Secondary | ICD-10-CM

## 2022-05-24 DIAGNOSIS — R0989 Other specified symptoms and signs involving the circulatory and respiratory systems: Secondary | ICD-10-CM

## 2022-05-24 NOTE — Patient Instructions (Addendum)
We will have you wear a monitor for 2 weeks.   Wear compression hose.  If you feel lightheaded immediately get down- either sitting or lying.   You can be liberal with salt intake.    Schedule follow up appointment with Dr.Jordan's PA

## 2022-05-28 ENCOUNTER — Ambulatory Visit: Payer: PPO | Attending: Cardiology

## 2022-05-28 DIAGNOSIS — R0989 Other specified symptoms and signs involving the circulatory and respiratory systems: Secondary | ICD-10-CM

## 2022-05-28 DIAGNOSIS — R55 Syncope and collapse: Secondary | ICD-10-CM

## 2022-05-28 NOTE — Progress Notes (Unsigned)
Enrolled for [employer] to mail a ZIO XT long term holter monitor to the patients address on file.  

## 2022-05-31 DIAGNOSIS — R55 Syncope and collapse: Secondary | ICD-10-CM | POA: Diagnosis not present

## 2022-05-31 DIAGNOSIS — R0989 Other specified symptoms and signs involving the circulatory and respiratory systems: Secondary | ICD-10-CM

## 2022-06-04 DIAGNOSIS — F320 Major depressive disorder, single episode, mild: Secondary | ICD-10-CM | POA: Diagnosis not present

## 2022-06-04 DIAGNOSIS — E785 Hyperlipidemia, unspecified: Secondary | ICD-10-CM | POA: Diagnosis not present

## 2022-06-04 DIAGNOSIS — K219 Gastro-esophageal reflux disease without esophagitis: Secondary | ICD-10-CM | POA: Diagnosis not present

## 2022-06-04 DIAGNOSIS — E039 Hypothyroidism, unspecified: Secondary | ICD-10-CM | POA: Diagnosis not present

## 2022-06-04 DIAGNOSIS — G8929 Other chronic pain: Secondary | ICD-10-CM | POA: Diagnosis not present

## 2022-06-20 DIAGNOSIS — R0989 Other specified symptoms and signs involving the circulatory and respiratory systems: Secondary | ICD-10-CM | POA: Diagnosis not present

## 2022-06-20 DIAGNOSIS — R55 Syncope and collapse: Secondary | ICD-10-CM | POA: Diagnosis not present

## 2022-06-24 ENCOUNTER — Encounter: Payer: Self-pay | Admitting: Physician Assistant

## 2022-06-24 ENCOUNTER — Ambulatory Visit: Payer: PPO | Attending: Physician Assistant | Admitting: Physician Assistant

## 2022-06-24 VITALS — BP 124/66 | HR 72 | Ht 63.0 in | Wt 156.6 lb

## 2022-06-24 DIAGNOSIS — R0609 Other forms of dyspnea: Secondary | ICD-10-CM

## 2022-06-24 DIAGNOSIS — R42 Dizziness and giddiness: Secondary | ICD-10-CM

## 2022-06-24 NOTE — Patient Instructions (Addendum)
Medication Instructions:  Your physician recommends that you continue on your current medications as directed. Please refer to the Current Medication list given to you today.  *If you need a refill on your cardiac medications before your next appointment, please call your pharmacy*  Lab Work: NONE ordered at this time of appointment   If you have labs (blood work) drawn today and your tests are completely normal, you will receive your results only by: [place] (if you have MyChart) OR A paper copy in the mail If you have any lab test that is abnormal or we need to change your treatment, we will call you to review the results.  Testing/Procedures: Your physician has requested that you have an echocardiogram. Echocardiography is a painless test that uses sound waves to create images of your heart. It provides your doctor with information about the size and shape of your heart and how well your heart's chambers and valves are working. This procedure takes approximately one hour. There are no restrictions for this procedure.   Follow-Up: At [facility], you and your health needs are our priority.  As part of our continuing mission to provide you with exceptional heart care, we have created designated Provider Care Teams.  These Care Teams include your primary Cardiologist (physician) and Advanced Practice Providers (APPs -  Physician Assistants and Nurse Practitioners) who all work together to provide you with the care you need, when you need it.  Your next appointment:   6 month(s)  The format for your next appointment:   In Person  Provider:   Peter Martinique, MD     Other Instructions   Important Information About Sugar

## 2022-06-24 NOTE — Progress Notes (Unsigned)
Cardiology Office Note:    Date:  06/26/2022   ID:  Jamie Beltran, DOB 1934-11-20, MRN [id number]  PCP:  Shirline Frees, [place] Providers Cardiologist:  Peter Martinique, MD     Referring MD: Shirline Frees, MD   Chief Complaint  Patient presents with   Follow-up    Seen for Dr. Martinique    History of Present Illness:    Jamie Beltran is a 87 y.o. female with a hx of basal cell carcinoma, left breast cancer, COVID-19, GERD, hyperlipidemia, and hypothyroidism. Myoview in 2012 was normal.  Event monitor in 2015 was benign.  Echocardiogram in 2017 was normal.  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  She was recently referred to cardiology service for dyspnea on exertion and near syncope.  Recently, she has experienced sudden onset of sinking feeling and felt she was going to pass out.  Her systolic blood pressure was as low as 98 mmHg.  Other times, her systolic blood pressure could be as high as 150s to 160s.  Patient was seen in the ED for acute shortness of breath.  A delta troponin and BNP were normal.  EKG unchanged.  CT of the chest was showed no PE.  Patient was seen by Dr. Martinique on 05/24/2022 who felt her hemodynamic instability could be related to transient drop in blood pressure from either orthostasis or neurocardiogenic cause.  It was felt the patient does not need further ischemic work-up.  Heart monitor was recommended.  Her monitor showed minimal SVT but nothing to explain her sinking spells.  Patient presents today for follow-up.  She still has occasional sinking sensation however at the same time she also has occasional orthostatic dizziness that is clearly related to body position changes as well.  I suspect patient has 2 different type of dizzy spell.  She denies any significant chest pain.  Her  shortness of breath with exertion has been improving.  I will recommend repeat echocardiogram given shortness of breath with exertion.  As far as dizziness, I recommend adequate hydration between 32 to 64 ounces per day.  She describes occasional ankle edema, I am hesitant to add any diuretic given recent dizzy spell especially with orthostatic dizziness, I recommended conservative management including leg elevation, compression stocking and low-salt diet.  She can follow-up with Dr. Martinique in 6 months unless echocardiogram come back abnormal.   Past Medical History:  Diagnosis Date   Basal cell carcinoma    Blood in stool    Breast cancer ([place]) 09/2017   left breast   Chronic back pain    Colonic polyp    COVID 03/24/2021   Diverticulitis    Diverticulosis    Esophageal reflux    Family history of adverse reaction to anesthesia    Sister has N & V   Fibrocystic breast disease    GERD (gastroesophageal reflux disease) 05/29/2016   Hiatal hernia    History of kidney stones    Hyperlipidemia 05/29/2016   Hypothyroidism 05/29/2016   Migraine 05/29/2016   Migraine headache    OA (osteoarthritis)    back, hips, knees   Other and unspecified hyperlipidemia     Past Surgical History:  Procedure Laterality Date   ABDOMINAL HYSTERECTOMY     BLADDER SUSPENSION  1986   CARDIAC CATHETERIZATION N/A 05/29/2016   Procedure: Left Heart  Cath and Coronary Angiography;  Surgeon: Sherren Mocha, MD;  Location: [facility];  Service: Cardiovascular;  Laterality: N/A;   CATARACT EXTRACTION, BILATERAL     CHOLECYSTECTOMY  2002   COLONOSCOPY  04/08   COLONOSCOPY N/A 05/11/2020   Procedure: DIAGNOSTIC COLONOSCOPY;  Surgeon: Jesusita Oka, MD;  [facility];  Service: General;  Laterality: N/A;   ESOPHAGOGASTRODUODENOSCOPY  9/06, 4/08   FOOT SURGERY  1990   HEEL SPUR EXCISION  2001   KNEE ARTHROSCOPY Bilateral 2006, 2013   LUMBAR LAMINECTOMY/DECOMPRESSION MICRODISCECTOMY N/A  04/26/2021   Procedure: Microlumbar decompression Lumbar four-five, lateral mass fusion using autologus allograft bone;  Surgeon: Susa Day, MD;  Location: [facility];  Service: Orthopedics;  Laterality: N/A;   MASTECTOMY Left 09/23/2017   TOTAL ABDOMINAL HYSTERECTOMY W/ BILATERAL SALPINGOOPHORECTOMY  1981   TOTAL MASTECTOMY Left 10/31/2017   Procedure: LEFT TOTAL MASTECTOMY;  Surgeon: Excell Seltzer, MD;  Location: [place];  Service: General;  Laterality: Left;   VEIN LIGATION Left 1980    Current Medications: Current Meds  Medication Sig   anastrozole (ARIMIDEX) 1 MG tablet TAKE 1 TABLET BY MOUTH  DAILY   aspirin EC 81 MG EC tablet Take 1 tablet (81 mg total) by mouth daily. Swallow whole.   Cholecalciferol (VITAMIN D) 50 MCG (2000 UT) tablet Take 2,000 Units by mouth daily.   conjugated estrogens (PREMARIN) vaginal cream Place 1 Applicatorful vaginally daily as needed (itching).   cycloSPORINE (RESTASIS) 0.05 % ophthalmic emulsion Place 1 drop into both eyes 2 (two) times daily.   diclofenac sodium (VOLTAREN) 1 % GEL Apply 2 g topically daily as needed (pain).    escitalopram (LEXAPRO) 20 MG tablet Take 20 mg by mouth daily.   gabapentin (NEURONTIN) 300 MG capsule Take 300 mg by mouth 2 (two) times daily.    HYDROcodone-acetaminophen (NORCO) 10-325 MG tablet Take 1 tablet by mouth every 4 (four) hours as needed for moderate pain.   hydrocortisone 2.5 % cream Apply 1 application topically 2 (two) times daily as needed (irritation).   levothyroxine (SYNTHROID) 88 MCG tablet Take 88 mcg by mouth daily before breakfast.     Allergies:   Codeine, Penicillins, Adhesive [tape], Protonix [pantoprazole sodium], and Tapentadol   Social History   Socioeconomic History   Marital status: Married    Spouse name: Don   Number of children: 3   Years of education: HS   Highest education level: Not on file  Occupational History   Occupation: retired   Occupation: RETIRED     Employer: [employer]  Tobacco Use   Smoking status: Former    Packs/day: 1.00    Years: 20.00    Total pack years: 20.00    Types: Cigarettes    Quit date: 09/23/1980    Years since quitting: 41.7   Smokeless tobacco: Never  Vaping Use   Vaping Use: Never used  Substance and Sexual Activity   Alcohol use: Yes    Comment: rarely   Drug use: No   Sexual activity: Not Currently  Other Topics Concern   Not on file  Social History Narrative   Patient lives at home with spouse.   Caffeine use: 1-2 cups daily   Social Determinants of Health   Financial Resource Strain: Not on file  Food Insecurity: Not on file  Transportation Needs: Not on file  Physical Activity: Not on file  Stress: Not on file  Social Connections: Not on file     Family  History: The patient's family history includes Breast cancer in her maternal aunt; Heart attack in her mother; Hypertension in her father; Kidney Stones in her sister and sister; Lung cancer in her maternal aunt.  ROS:   Please see the history of present illness.     All other systems reviewed and are negative.  EKGs/Labs/Other Studies Reviewed:    The following studies were reviewed today:  Echo 05/29/2016 LV EF: 60% -   65%   -------------------------------------------------------------------  Indications:      Chest pain 786.51.   -------------------------------------------------------------------  History:   PMH:  Near Syncopal episodes, with shortness of breath.  PMH:  DVT. Vasovagal Syncope.   -------------------------------------------------------------------  Study Conclusions   - Left ventricle: The cavity size was normal. Systolic function was    normal. The estimated ejection fraction was in the range of 60%    to 65%. Wall motion was normal; there were no regional wall    motion abnormalities. There was an increased relative    contribution of atrial contraction to ventricular filling.    Doppler parameters are consistent  with abnormal left ventricular    relaxation (grade 1 diastolic dysfunction).  - Aortic valve: Trileaflet; normal thickness, mildly calcified    leaflets.   EKG:  EKG is not ordered today.    Recent Labs: 05/09/2022: B Natriuretic Peptide 46.3; BUN 20; Creatinine, Ser 0.92; Hemoglobin 11.7; Platelets 232; Potassium 4.2; Sodium 139  Recent Lipid Panel    Component Value Date/Time   CHOL 178 05/29/2016 0256   TRIG 212 (H) 05/29/2016 0256   HDL 42 05/29/2016 0256   CHOLHDL 4.2 05/29/2016 0256   VLDL 42 (H) 05/29/2016 0256   LDLCALC 94 05/29/2016 0256     Risk Assessment/Calculations:           Physical Exam:    VS:  BP 124/66   Pulse 72   Ht '5\' 3"'$  (1.6 m)   Wt 156 lb 9.6 oz (71 kg)   SpO2 96%   BMI 27.74 kg/m        Wt Readings from Last 3 Encounters:  06/24/22 156 lb 9.6 oz (71 kg)  05/24/22 158 lb (71.7 kg)  09/07/21 149 lb (67.6 kg)     GEN:  Well nourished, well developed in no acute distress HEENT: Normal NECK: No JVD; No carotid bruits LYMPHATICS: No lymphadenopathy CARDIAC: RRR, no murmurs, rubs, gallops RESPIRATORY:  Clear to auscultation without rales, wheezing or rhonchi  ABDOMEN: Soft, non-tender, non-distended MUSCULOSKELETAL:  No edema; No deformity  SKIN: Warm and dry NEUROLOGIC:  Alert and oriented x 3 PSYCHIATRIC:  Normal affect   ASSESSMENT:    1. Dizziness   2. DOE (dyspnea on exertion)    PLAN:    In order of problems listed above:  Dizziness: Patient does have dizziness while changing the body positions, however over she will occasionally has a sinking sensation along with feeling of passing out.  Dr. Martinique recently seen the patient, CTA negative for PE.  A heart monitor was placed which showed occasional brief SVT however not which lasted long enough to generate symptom.  No further work-up at this time  Dyspnea on exertion: Last echocardiogram in September 2017, will obtain repeat echocardiogram.           Medication  Adjustments/Labs and Tests Ordered: Current medicines are reviewed at length with the patient today.  Concerns regarding medicines are outlined above.  Orders Placed This Encounter  Procedures   ECHOCARDIOGRAM COMPLETE  No orders of the defined types were placed in this encounter.   Patient Instructions  Medication Instructions:  Your physician recommends that you continue on your current medications as directed. Please refer to the Current Medication list given to you today.  *If you need a refill on your cardiac medications before your next appointment, please call your pharmacy*  Lab Work: NONE ordered at this time of appointment   If you have labs (blood work) drawn today and your tests are completely normal, you will receive your results only by: [place] (if you have MyChart) OR A paper copy in the mail If you have any lab test that is abnormal or we need to change your treatment, we will call you to review the results.  Testing/Procedures: Your physician has requested that you have an echocardiogram. Echocardiography is a painless test that uses sound waves to create images of your heart. It provides your doctor with information about the size and shape of your heart and how well your heart's chambers and valves are working. This procedure takes approximately one hour. There are no restrictions for this procedure.   Follow-Up: At [facility], you and your health needs are our priority.  As part of our continuing mission to provide you with exceptional heart care, we have created designated Provider Care Teams.  These Care Teams include your primary Cardiologist (physician) and Advanced Practice Providers (APPs -  Physician Assistants and Nurse Practitioners) who all work together to provide you with the care you need, when you need it.  Your next appointment:   6 month(s)  The format for your next appointment:   In Person  Provider:   Peter Martinique, MD      Other Instructions   Important Information About Sugar         Signed, Almyra Deforest, [place]  06/26/2022 12:16 AM    [place]

## 2022-06-26 ENCOUNTER — Encounter: Payer: Self-pay | Admitting: Physician Assistant

## 2022-07-01 ENCOUNTER — Ambulatory Visit (HOSPITAL_COMMUNITY): Payer: PPO | Attending: Cardiology

## 2022-07-01 DIAGNOSIS — R0609 Other forms of dyspnea: Secondary | ICD-10-CM | POA: Diagnosis not present

## 2022-07-01 LAB — ECHOCARDIOGRAM COMPLETE
Area-P 1/2: 3.19 cm2
S' Lateral: 2.7 cm

## 2022-08-06 DIAGNOSIS — E039 Hypothyroidism, unspecified: Secondary | ICD-10-CM | POA: Diagnosis not present

## 2022-08-06 DIAGNOSIS — Z0000 Encounter for general adult medical examination without abnormal findings: Secondary | ICD-10-CM | POA: Diagnosis not present

## 2022-08-06 DIAGNOSIS — C50912 Malignant neoplasm of unspecified site of left female breast: Secondary | ICD-10-CM | POA: Diagnosis not present

## 2022-08-06 DIAGNOSIS — G8929 Other chronic pain: Secondary | ICD-10-CM | POA: Diagnosis not present

## 2022-08-06 DIAGNOSIS — R252 Cramp and spasm: Secondary | ICD-10-CM | POA: Diagnosis not present

## 2022-08-06 DIAGNOSIS — R300 Dysuria: Secondary | ICD-10-CM | POA: Diagnosis not present

## 2022-08-06 DIAGNOSIS — K219 Gastro-esophageal reflux disease without esophagitis: Secondary | ICD-10-CM | POA: Diagnosis not present

## 2022-08-06 DIAGNOSIS — F320 Major depressive disorder, single episode, mild: Secondary | ICD-10-CM | POA: Diagnosis not present

## 2022-09-06 NOTE — Progress Notes (Incomplete)
Patient Care Team: Shirline Frees, MD as PCP - General (Family Medicine) Martinique, Peter M, MD as PCP - Cardiology (Cardiology) Nicholas Lose, MD as Consulting Physician (Hematology and Oncology) Excell Seltzer, MD (Inactive) as Consulting Physician (General Surgery) Delice Bison Charlestine Massed, NP as Nurse Practitioner (Hematology and Oncology)  DIAGNOSIS: No diagnosis found.  SUMMARY OF ONCOLOGIC HISTORY: Oncology History  Malignant neoplasm of upper-inner quadrant of left breast in female, estrogen receptor positive ([place])  10/13/2017 Initial Diagnosis   Left breast palpable mass and calcifications which are lateral and anterior to the mass, ultrasound 1.5 cm at 9:00, satellite mass, 3 additional masses 5 mm, 4 mm, 4 mm not biopsied.  Biopsy of calcifications: DCIS ER 100%, PR 100%; biopsy of the mass IDC grade 2 with DCIS ER 90%, PR 100%, Ki-67 30%, HER-2 negative ratio 1.83, T1 cN0 stage I a clinical stage   10/31/2017 Surgery   Left breast mastectomy ([facility]): IDC, grade 2, 1.6 cm, margins negative, no SLNB   10/2017 -  Anti-estrogen oral therapy   Letrozole daily switched to anastrozole 04/27/2018 due to hair loss   02/04/2018 Cancer Staging   Staging form: Breast, AJCC 8th Edition - Pathologic: Stage IA (pT1c, pN0, cM0, G2, ER+, PR+, HER2-) - Signed by Gardenia Phlegm, NP on 02/04/2018     CHIEF COMPLIANT: Follow-up of breast cancer on anastrozole   INTERVAL HISTORY: Jamie Beltran is a 87 year old with above-mentioned history of left breast cancer treated with mastectomy and is currently on oral antiestrogen therapy with anastrozole. She presents to the clinic for a follow-up.   ALLERGIES:  is allergic to codeine, penicillins, adhesive [tape], protonix [pantoprazole sodium], and tapentadol.  MEDICATIONS:  Current Outpatient Medications  Medication Sig Dispense Refill   anastrozole (ARIMIDEX) 1 MG tablet TAKE 1 TABLET BY MOUTH  DAILY 90 tablet 3   aspirin EC 81 MG  EC tablet Take 1 tablet (81 mg total) by mouth daily. Swallow whole. 30 tablet 1   Cholecalciferol (VITAMIN D) 50 MCG (2000 UT) tablet Take 2,000 Units by mouth daily.     conjugated estrogens (PREMARIN) vaginal cream Place 1 Applicatorful vaginally daily as needed (itching).     cycloSPORINE (RESTASIS) 0.05 % ophthalmic emulsion Place 1 drop into both eyes 2 (two) times daily.     diclofenac sodium (VOLTAREN) 1 % GEL Apply 2 g topically daily as needed (pain).      docusate sodium (COLACE) 100 MG capsule Take 1 capsule (100 mg total) by mouth 2 (two) times daily as needed for mild constipation. (Patient not taking: Reported on 06/24/2022) 40 capsule 0   escitalopram (LEXAPRO) 20 MG tablet Take 20 mg by mouth daily.     famotidine (PEPCID) 20 MG tablet Take 20 mg by mouth at bedtime. (Patient not taking: Reported on 06/24/2022)     gabapentin (NEURONTIN) 300 MG capsule Take 300 mg by mouth 2 (two) times daily.      HYDROcodone-acetaminophen (NORCO) 10-325 MG tablet Take 1 tablet by mouth every 4 (four) hours as needed for moderate pain. 42 tablet 0   hydrocortisone 2.5 % cream Apply 1 application topically 2 (two) times daily as needed (irritation).     levothyroxine (SYNTHROID) 88 MCG tablet Take 88 mcg by mouth daily before breakfast.     Magnesium 250 MG TABS Take 250 mg by mouth daily. (Patient not taking: Reported on 06/24/2022)     omeprazole (PRILOSEC) 40 MG capsule Take 40 mg by mouth daily. (Patient not taking: Reported on  06/24/2022)     polyethylene glycol (MIRALAX / GLYCOLAX) 17 g packet Take 17 g by mouth daily as needed for mild constipation. (Patient not taking: Reported on 06/24/2022) 14 each 0   No current facility-administered medications for this visit.    PHYSICAL EXAMINATION: ECOG PERFORMANCE STATUS: {CHL ONC ECOG [id number]}  There were no vitals filed for this visit. There were no vitals filed for this visit.  BREAST:*** No palpable masses or nodules in either right or  left breasts. No palpable axillary supraclavicular or infraclavicular adenopathy no breast tenderness or nipple discharge. (exam performed in the presence of a chaperone)  LABORATORY DATA:  I have reviewed the data as listed    Latest Ref Rng & Units 05/09/2022    5:31 PM 04/27/2021    2:15 AM 04/18/2021   11:18 AM  CMP  Glucose 70 - 99 mg/dL 94  96  117   BUN 8 - 23 mg/dL _0 Creatinine 0.44 - 1.00 mg/dL 0.92  0.76  0.80   Sodium 135 - 145 mmol/L 139  141  137   Potassium 3.5 - 5.1 mmol/L 4.2  4.1  4.0   Chloride 98 - 111 mmol/L 106  108  105   CO2 22 - 32 mmol/L _1 Calcium 8.9 - 10.3 mg/dL 9.8  8.7  9.7     Lab Results  Component Value Date   WBC 7.2 05/09/2022   HGB 11.7 (L) 05/09/2022   HCT 35.7 (L) 05/09/2022   MCV 85.8 05/09/2022   PLT 232 05/09/2022   NEUTROABS 3.6 10/22/2017    ASSESSMENT & PLAN:  No problem-specific Assessment & Plan notes found for this encounter.    No orders of the defined types were placed in this encounter.  The patient has a good understanding of the overall plan. she agrees with it. she will call with any problems that may develop before the next visit here. Total time spent: 30 mins including face to face time and time spent for planning, charting and co-ordination of care   Suzzette Righter, [place] 09/06/22    I Gardiner Coins am acting as a Education administrator for [employer]  ***

## 2022-09-09 ENCOUNTER — Inpatient Hospital Stay: Payer: PPO | Admitting: Hematology and Oncology

## 2022-09-09 NOTE — Assessment & Plan Note (Deleted)
Left Mastectomy: 10/31/2017: IDC grade 2 1.6 cm 0/3 lymph nodes negative, ER PR positive HER-2 negative with Ki-67 of 30%, T1 cN0 stage I a   Recommendation: 1.  Letrozole 2.5 mg daily times 5 years started February 2019, switched to anastrozole 04/27/2018 2. Osteoporosis: Bone density done recently showed a T score of -2.7 suggestive of osteoporosis.  Currently on Fosamax with calcium and vitamin D   Anastrozole toxicities:  Hair loss Hot flashes better on anastrozole.   Breast Cancer Surveillance:  1. Breast Exam: 09/09/2022   2. Mammogram right breast: Right breast mammogram will need to be arranged.   She completed 5 years of antiestrogen therapy therefore we discussed about discontinuation of treatment at this time. She can be followed up annually on an as-needed basis.

## 2022-10-02 DIAGNOSIS — L570 Actinic keratosis: Secondary | ICD-10-CM | POA: Diagnosis not present

## 2022-10-02 DIAGNOSIS — L814 Other melanin hyperpigmentation: Secondary | ICD-10-CM | POA: Diagnosis not present

## 2022-10-02 DIAGNOSIS — L538 Other specified erythematous conditions: Secondary | ICD-10-CM | POA: Diagnosis not present

## 2022-10-02 DIAGNOSIS — L821 Other seborrheic keratosis: Secondary | ICD-10-CM | POA: Diagnosis not present

## 2022-10-02 DIAGNOSIS — L820 Inflamed seborrheic keratosis: Secondary | ICD-10-CM | POA: Diagnosis not present

## 2022-10-02 DIAGNOSIS — Z09 Encounter for follow-up examination after completed treatment for conditions other than malignant neoplasm: Secondary | ICD-10-CM | POA: Diagnosis not present

## 2022-10-02 DIAGNOSIS — D485 Neoplasm of uncertain behavior of skin: Secondary | ICD-10-CM | POA: Diagnosis not present

## 2022-10-02 DIAGNOSIS — L439 Lichen planus, unspecified: Secondary | ICD-10-CM | POA: Diagnosis not present

## 2022-10-08 ENCOUNTER — Other Ambulatory Visit: Payer: Self-pay

## 2022-10-08 ENCOUNTER — Inpatient Hospital Stay: Payer: PPO | Attending: Hematology and Oncology | Admitting: Hematology and Oncology

## 2022-10-08 VITALS — BP 145/97 | HR 81 | Temp 97.7°F | Resp 15 | Wt 157.0 lb

## 2022-10-08 DIAGNOSIS — Z79899 Other long term (current) drug therapy: Secondary | ICD-10-CM | POA: Insufficient documentation

## 2022-10-08 DIAGNOSIS — C50212 Malignant neoplasm of upper-inner quadrant of left female breast: Secondary | ICD-10-CM | POA: Diagnosis not present

## 2022-10-08 DIAGNOSIS — Z7982 Long term (current) use of aspirin: Secondary | ICD-10-CM | POA: Diagnosis not present

## 2022-10-08 DIAGNOSIS — Z79811 Long term (current) use of aromatase inhibitors: Secondary | ICD-10-CM | POA: Diagnosis not present

## 2022-10-08 DIAGNOSIS — Z170 Estrogen receptor positive status [ER+]: Secondary | ICD-10-CM | POA: Insufficient documentation

## 2022-10-08 DIAGNOSIS — Z9012 Acquired absence of left breast and nipple: Secondary | ICD-10-CM | POA: Insufficient documentation

## 2022-10-08 MED ORDER — PREMARIN 0.625 MG/GM VA CREA: 1.0000 | TOPICAL_CREAM | Freq: Every day | VAGINAL | 12 refills | Status: AC

## 2022-10-08 NOTE — Progress Notes (Signed)
Patient Care Team: Shirline Frees, MD as PCP - General (Family Medicine) [place]ique, Peter M, MD as PCP - Cardiology (Cardiology) Nicholas Lose, MD as Consulting Physician (Hematology and Oncology) Excell Seltzer, MD (Inactive) as Consulting Physician (General Surgery) Delice Bison Charlestine Massed, NP as Nurse Practitioner (Hematology and Oncology)  DIAGNOSIS:  Encounter Diagnosis  Name Primary?   Malignant neoplasm of upper-inner quadrant of left breast in female, estrogen receptor positive ([place]) Yes    SUMMARY OF ONCOLOGIC HISTORY: Oncology History  Malignant neoplasm of upper-inner quadrant of left breast in female, estrogen receptor positive ([place])  10/13/2017 Initial Diagnosis   Left breast palpable mass and calcifications which are lateral and anterior to the mass, ultrasound 1.5 cm at 9:00, satellite mass, 3 additional masses 5 mm, 4 mm, 4 mm not biopsied.  Biopsy of calcifications: DCIS ER 100%, PR 100%; biopsy of the mass IDC grade 2 with DCIS ER 90%, PR 100%, Ki-67 30%, HER-2 negative ratio 1.83, T1 cN0 stage I a clinical stage   10/31/2017 Surgery   Left breast mastectomy ([facility]): IDC, grade 2, 1.6 cm, margins negative, no SLNB   10/2017 -  Anti-estrogen oral therapy   Letrozole daily switched to anastrozole 04/27/2018 due to hair loss   02/04/2018 Cancer Staging   Staging form: Breast, AJCC 8th Edition - Pathologic: Stage IA (pT1c, pN0, cM0, G2, ER+, PR+, HER2-) - Signed by Gardenia Phlegm, NP on 02/04/2018     CHIEF COMPLIANT: Follow up anastrozole  INTERVAL HISTORY: Jamie Beltran is a 88 year old with above-mentioned history of left breast cancer treated with mastectomy and is currently on oral antiestrogen therapy with anastrozole. She reports that she lost her husband and her son and daughter this past year.  She denies any pain or discomfort in the breast or chest wall.   ALLERGIES:  is allergic to codeine, penicillins, adhesive [tape], protonix  [pantoprazole sodium], and tapentadol.  MEDICATIONS:  Current Outpatient Medications  Medication Sig Dispense Refill   conjugated estrogens (PREMARIN) vaginal cream Place 1 Applicatorful vaginally daily. 42.5 g 12   omeprazole (PRILOSEC) 40 MG capsule Take 40 mg by mouth daily.     aspirin EC 81 MG EC tablet Take 1 tablet (81 mg total) by mouth daily. Swallow whole. 30 tablet 1   Cholecalciferol (VITAMIN D) 50 MCG (2000 UT) tablet Take 2,000 Units by mouth daily.     cycloSPORINE (RESTASIS) 0.05 % ophthalmic emulsion Place 1 drop into both eyes 2 (two) times daily.     diclofenac sodium (VOLTAREN) 1 % GEL Apply 2 g topically daily as needed (pain).      escitalopram (LEXAPRO) 20 MG tablet Take 20 mg by mouth daily.     gabapentin (NEURONTIN) 300 MG capsule Take 300 mg by mouth 2 (two) times daily.      HYDROcodone-acetaminophen (NORCO) 10-325 MG tablet Take 1 tablet by mouth every 4 (four) hours as needed for moderate pain. 42 tablet 0   hydrocortisone 2.5 % cream Apply 1 application topically 2 (two) times daily as needed (irritation).     levothyroxine (SYNTHROID) 88 MCG tablet Take 88 mcg by mouth daily before breakfast.     No current facility-administered medications for this visit.    PHYSICAL EXAMINATION: ECOG PERFORMANCE STATUS: 1 - Symptomatic but completely ambulatory  Vitals:   10/08/22 1518  BP: (!) 145/97  Pulse: 81  Resp: 15  Temp: 97.7 F (36.5 C)  SpO2: 100%   Filed Weights   10/08/22 1518  Weight: 157 lb (  71.2 kg)    BREAST: No palpable masses or nodules in either right   breasts or left chest wall or axilla.  No palpable axillary supraclavicular or infraclavicular adenopathy no breast tenderness or nipple discharge. (exam performed in the presence of a chaperone)  LABORATORY DATA:  I have reviewed the data as listed    Latest Ref Rng & Units 05/09/2022    5:31 PM 04/27/2021    2:15 AM 04/18/2021   11:18 AM  CMP  Glucose 70 - 99 mg/dL 94  96  117   BUN 8 -  23 mg/dL '20  5  13   '$ Creatinine 0.44 - 1.00 mg/dL 0.92  0.76  0.80   Sodium 135 - 145 mmol/L 139  141  137   Potassium 3.5 - 5.1 mmol/L 4.2  4.1  4.0   Chloride 98 - 111 mmol/L 106  108  105   CO2 22 - 32 mmol/L '28  29  28   '$ Calcium 8.9 - 10.3 mg/dL 9.8  8.7  9.7     Lab Results  Component Value Date   WBC 7.2 05/09/2022   HGB 11.7 (L) 05/09/2022   HCT 35.7 (L) 05/09/2022   MCV 85.8 05/09/2022   PLT 232 05/09/2022   NEUTROABS 3.6 10/22/2017    ASSESSMENT & PLAN:  Malignant neoplasm of upper-inner quadrant of left breast in female, estrogen receptor positive ([place]) Left Mastectomy: 10/31/2017: IDC grade 2 1.6 cm 0/3 lymph nodes negative, ER PR positive HER-2 negative with Ki-67 of 30%, T1 cN0 stage I a   Recommendation: 1.  Letrozole 2.5 mg daily times 5 years started February 2019, switched to anastrozole 04/27/2018 completed 10/08/2022 2. Osteoporosis: Bone density done recently showed a T score of -2.7 suggestive of osteoporosis.  Currently on Fosamax with calcium and vitamin D   Anastrozole toxicities:  Hair loss Hot flashes better on anastrozole. She will discontinue anastrozole therapy since she completed 5 years of it.   Breast Cancer Surveillance:  1. Breast Exam: 1/16/2024lump in the left axilla: Ultrasound of the left axilla: Benign 2. Mammogram right breast:  12/14/2020: Benign breast density category B Patient will need a new mammogram.  Return to clinic on an as-needed basis     No orders of the defined types were placed in this encounter.  The patient has a good understanding of the overall plan. she agrees with it. she will call with any problems that may develop before the next visit here. Total time spent: 30 mins including face to face time and time spent for planning, charting and co-ordination of care   Harriette Ohara, MD 10/08/22    I Gardiner Coins am acting as a Education administrator for [employer]  I have reviewed the above documentation for accuracy and  completeness, and I agree with the above.

## 2022-10-08 NOTE — Assessment & Plan Note (Addendum)
Left Mastectomy: 10/31/2017: IDC grade 2 1.6 cm 0/3 lymph nodes negative, ER PR positive HER-2 negative with Ki-67 of 30%, T1 cN0 stage I a   Recommendation: 1.  Letrozole 2.5 mg daily times 5 years started February 2019, switched to anastrozole 04/27/2018 completed 10/08/2022 2. Osteoporosis: Bone density done recently showed a T score of -2.7 suggestive of osteoporosis.  Currently on Fosamax with calcium and vitamin D   Anastrozole toxicities:  Hair loss Hot flashes better on anastrozole.   Breast Cancer Surveillance:  1. Breast Exam: 1/16/2024lump in the left axilla: Ultrasound of the left axilla: Benign 2. Mammogram right breast:  12/14/2020: Benign breast density category B Patient will need a new mammogram.  Return to clinic on an as-needed basis

## 2022-10-18 ENCOUNTER — Other Ambulatory Visit: Payer: Self-pay | Admitting: Hematology and Oncology

## 2022-10-18 DIAGNOSIS — Z853 Personal history of malignant neoplasm of breast: Secondary | ICD-10-CM

## 2022-11-18 DIAGNOSIS — C50912 Malignant neoplasm of unspecified site of left female breast: Secondary | ICD-10-CM | POA: Diagnosis not present

## 2022-11-27 DIAGNOSIS — C50912 Malignant neoplasm of unspecified site of left female breast: Secondary | ICD-10-CM | POA: Diagnosis not present

## 2022-12-16 ENCOUNTER — Ambulatory Visit
Admission: RE | Admit: 2022-12-16 | Discharge: 2022-12-16 | Disposition: A | Discharge status: Home / Self Care | Payer: HMO | Source: Ambulatory Visit | Attending: Hematology and Oncology | Admitting: Hematology and Oncology

## 2022-12-16 DIAGNOSIS — Z1231 Encounter for screening mammogram for malignant neoplasm of breast: Secondary | ICD-10-CM | POA: Diagnosis not present

## 2022-12-16 DIAGNOSIS — Z853 Personal history of malignant neoplasm of breast: Secondary | ICD-10-CM

## 2023-02-04 DIAGNOSIS — F320 Major depressive disorder, single episode, mild: Secondary | ICD-10-CM | POA: Diagnosis not present

## 2023-02-04 DIAGNOSIS — E7800 Pure hypercholesterolemia, unspecified: Secondary | ICD-10-CM | POA: Diagnosis not present

## 2023-02-04 DIAGNOSIS — I700 Atherosclerosis of aorta: Secondary | ICD-10-CM | POA: Diagnosis not present

## 2023-02-04 DIAGNOSIS — G8929 Other chronic pain: Secondary | ICD-10-CM | POA: Diagnosis not present

## 2023-02-04 DIAGNOSIS — K219 Gastro-esophageal reflux disease without esophagitis: Secondary | ICD-10-CM | POA: Diagnosis not present

## 2023-02-04 DIAGNOSIS — E039 Hypothyroidism, unspecified: Secondary | ICD-10-CM | POA: Diagnosis not present

## 2023-04-02 DIAGNOSIS — L821 Other seborrheic keratosis: Secondary | ICD-10-CM | POA: Diagnosis not present

## 2023-04-02 DIAGNOSIS — L309 Dermatitis, unspecified: Secondary | ICD-10-CM | POA: Diagnosis not present

## 2023-04-02 DIAGNOSIS — L728 Other follicular cysts of the skin and subcutaneous tissue: Secondary | ICD-10-CM | POA: Diagnosis not present

## 2023-04-02 DIAGNOSIS — L905 Scar conditions and fibrosis of skin: Secondary | ICD-10-CM | POA: Diagnosis not present

## 2023-04-03 DIAGNOSIS — D649 Anemia, unspecified: Secondary | ICD-10-CM | POA: Diagnosis not present

## 2023-04-07 DIAGNOSIS — M5136 Other intervertebral disc degeneration, lumbar region: Secondary | ICD-10-CM | POA: Diagnosis not present

## 2023-04-25 DIAGNOSIS — M545 Low back pain, unspecified: Secondary | ICD-10-CM | POA: Diagnosis not present

## 2023-05-07 DIAGNOSIS — M545 Low back pain, unspecified: Secondary | ICD-10-CM | POA: Diagnosis not present

## 2023-05-13 DIAGNOSIS — M5416 Radiculopathy, lumbar region: Secondary | ICD-10-CM | POA: Diagnosis not present

## 2023-08-15 IMAGING — US US AXILLARY LEFT
1 series · 6 of 6 positions shown · non-contrast
Comparison: Left axilla ultrasound dated 04/15/2019.

CLINICAL DATA: Patient presents with left axillary region soreness,
initially also noting a small lump, which she does not currently
palpate. She has a history of a left mastectomy for breast
carcinoma, performed in 8186.

EXAM:
ULTRASOUND OF THE LEFT AXILLA

[Series 1: us axillary left · 0.06mm/px · 6 of 6 slices shown]
[im 1/6]
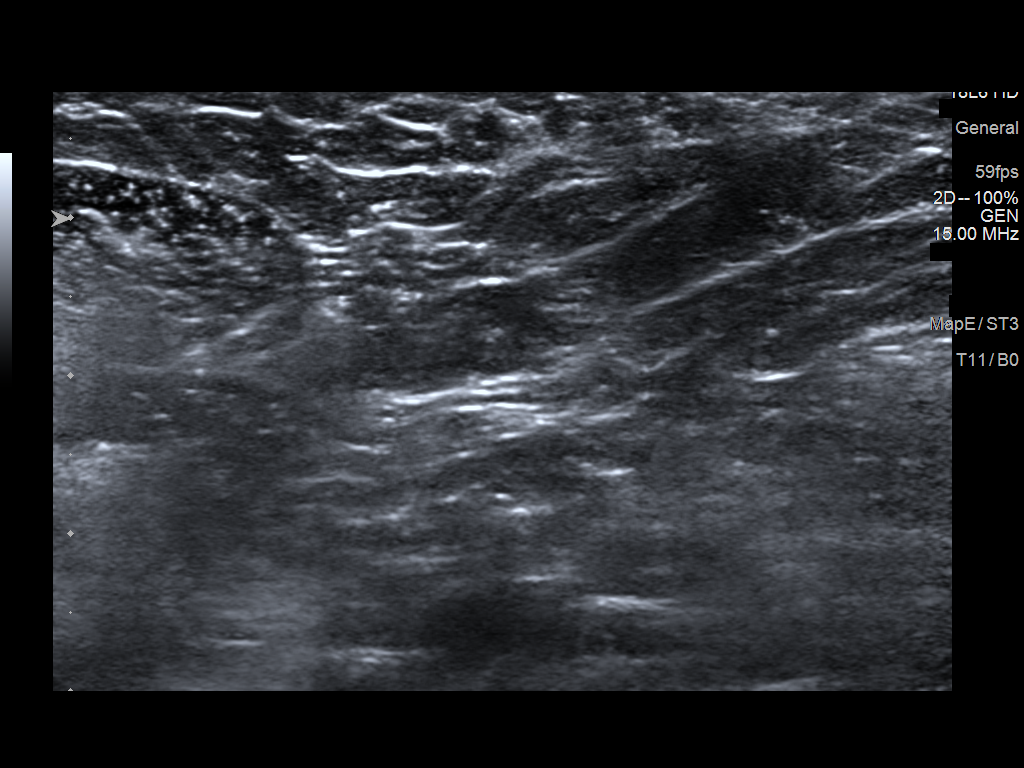
[im 2/6]
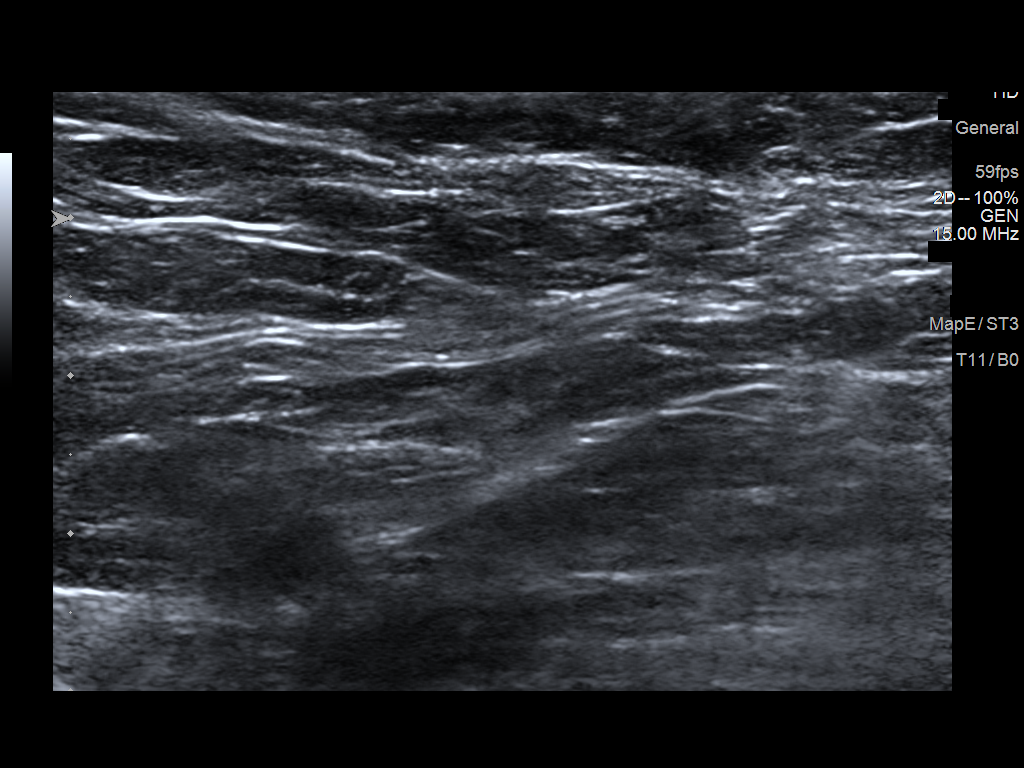
[im 3/6]
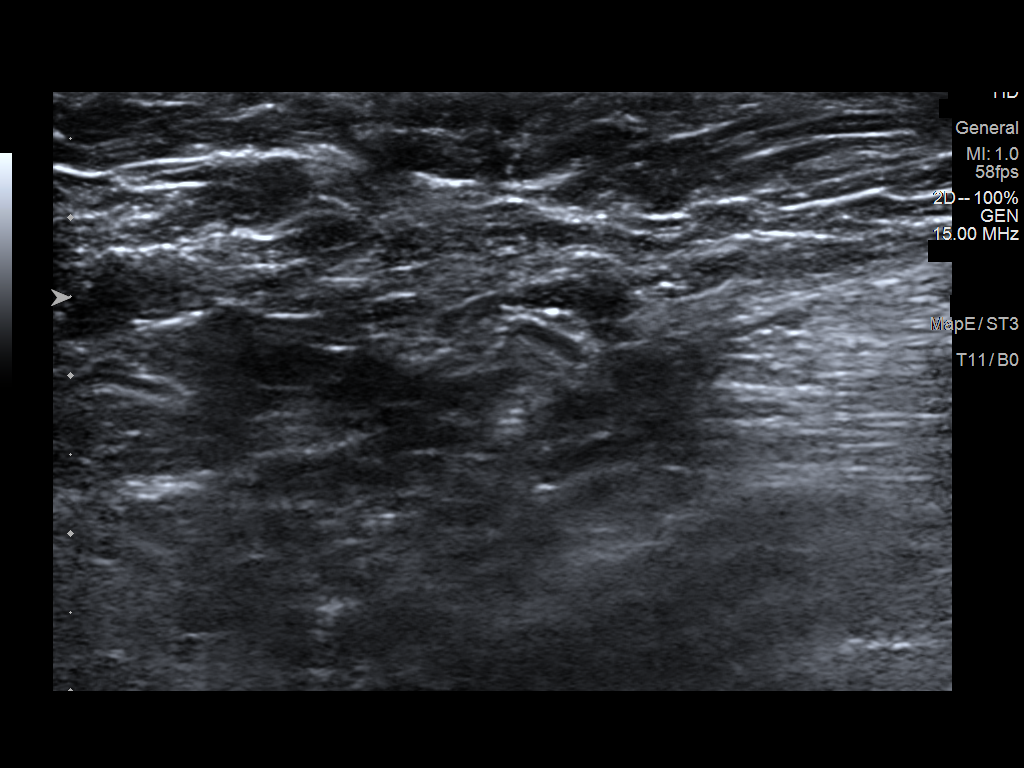
[im 4/6]
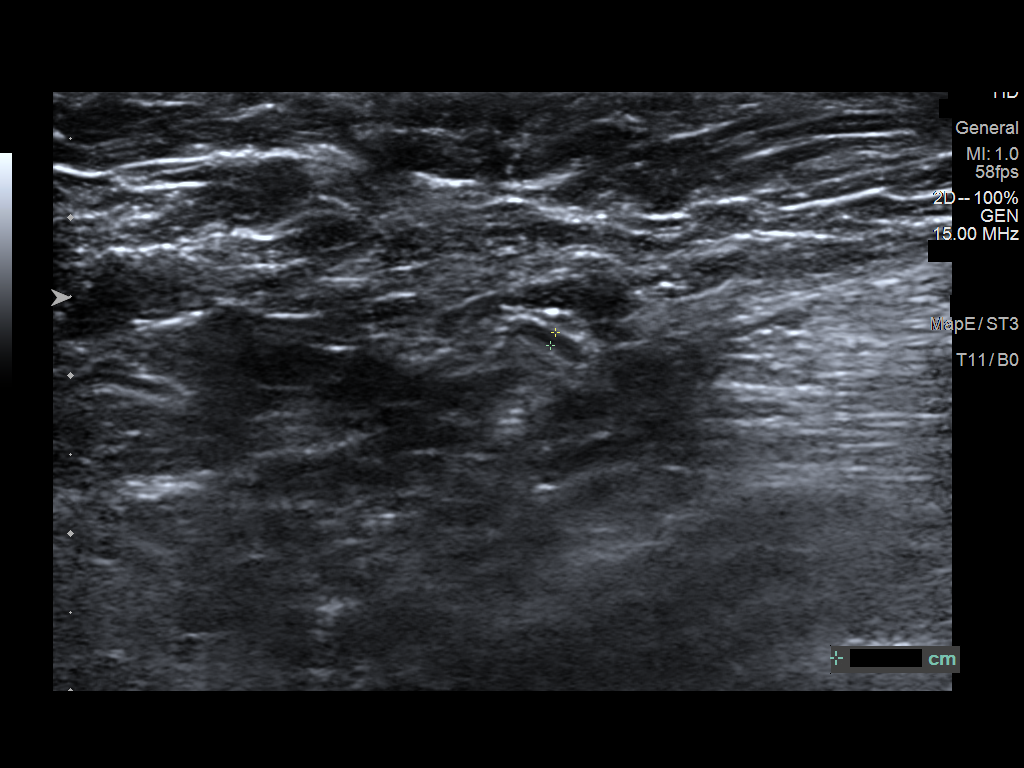
[im 5/6]
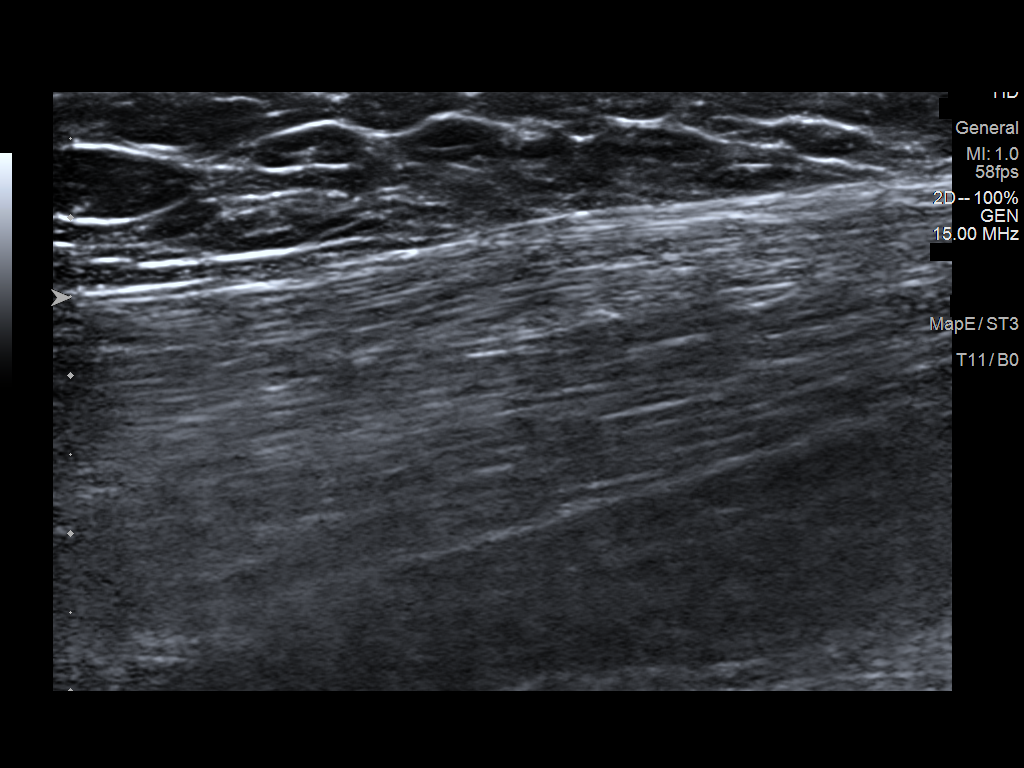
[im 6/6]
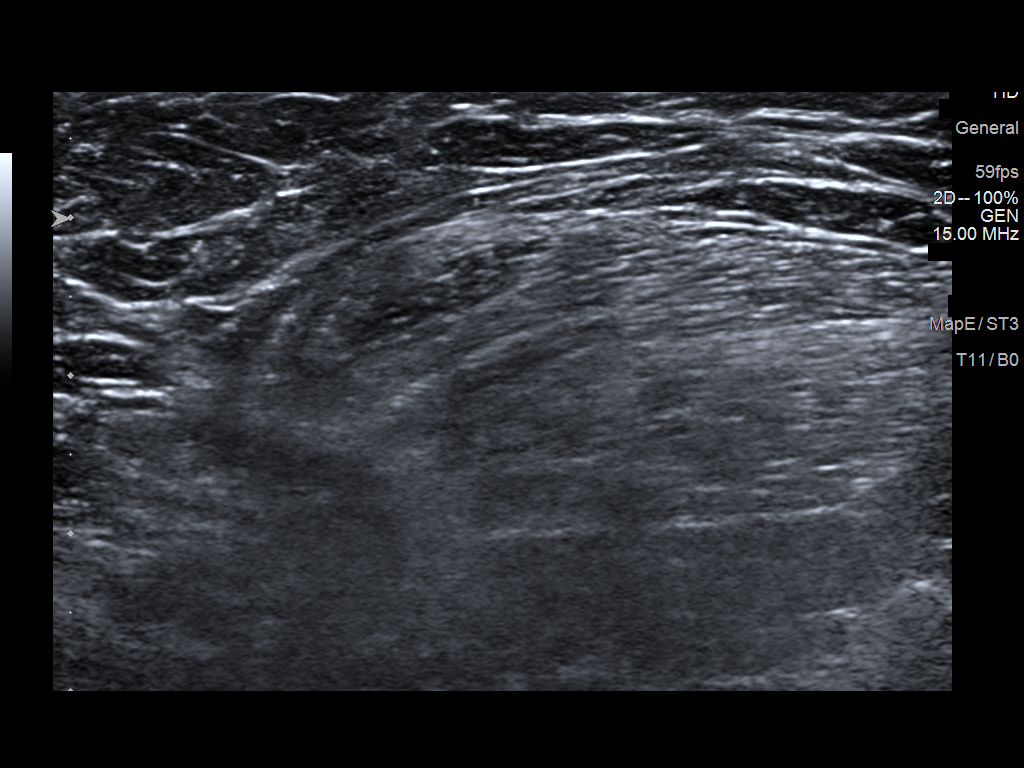

[6 of 6 positions shown; findings below may reference images not displayed]

FINDINGS: On physical exam,no mass is palpated along the left axilla. Patient
has tenderness corresponds to the inner margin of the posterior
axilla/latissimus dorsi.

Ultrasound is performed, showing normal tissue throughout the left
axilla. No masses. No enlarged or abnormal lymph nodes.
IMPRESSION: No evidence of breast malignancy. No mass or abnormal finding on
sonography of the left axilla.

RECOMMENDATION:
Clinical follow-up for the left axillary discomfort if it persists
or worsens.

I have discussed the findings and recommendations with the patient.
If applicable, a reminder letter will be sent to the patient
regarding the next appointment.

BI-RADS CATEGORY  1: Negative.

## 2023-08-18 DIAGNOSIS — G8929 Other chronic pain: Secondary | ICD-10-CM | POA: Diagnosis not present

## 2023-08-18 DIAGNOSIS — K219 Gastro-esophageal reflux disease without esophagitis: Secondary | ICD-10-CM | POA: Diagnosis not present

## 2023-08-18 DIAGNOSIS — E039 Hypothyroidism, unspecified: Secondary | ICD-10-CM | POA: Diagnosis not present

## 2023-08-18 DIAGNOSIS — E7800 Pure hypercholesterolemia, unspecified: Secondary | ICD-10-CM | POA: Diagnosis not present

## 2023-08-18 DIAGNOSIS — F320 Major depressive disorder, single episode, mild: Secondary | ICD-10-CM | POA: Diagnosis not present

## 2023-08-18 DIAGNOSIS — I700 Atherosclerosis of aorta: Secondary | ICD-10-CM | POA: Diagnosis not present

## 2023-08-18 DIAGNOSIS — M810 Age-related osteoporosis without current pathological fracture: Secondary | ICD-10-CM | POA: Diagnosis not present

## 2023-08-18 DIAGNOSIS — Z0000 Encounter for general adult medical examination without abnormal findings: Secondary | ICD-10-CM | POA: Diagnosis not present

## 2023-08-18 DIAGNOSIS — D508 Other iron deficiency anemias: Secondary | ICD-10-CM | POA: Diagnosis not present

## 2023-08-18 DIAGNOSIS — Z9181 History of falling: Secondary | ICD-10-CM | POA: Diagnosis not present

## 2023-08-19 ENCOUNTER — Other Ambulatory Visit: Payer: Self-pay | Admitting: Family Medicine

## 2023-08-19 DIAGNOSIS — Z1231 Encounter for screening mammogram for malignant neoplasm of breast: Secondary | ICD-10-CM

## 2023-12-30 ENCOUNTER — Encounter (HOSPITAL_BASED_OUTPATIENT_CLINIC_OR_DEPARTMENT_OTHER): Payer: Self-pay | Admitting: Emergency Medicine

## 2023-12-30 ENCOUNTER — Other Ambulatory Visit: Payer: Self-pay

## 2023-12-30 ENCOUNTER — Emergency Department (HOSPITAL_BASED_OUTPATIENT_CLINIC_OR_DEPARTMENT_OTHER)
Admission: EM | Admit: 2023-12-30 | Discharge: 2023-12-30 | Disposition: A | Discharge status: Home / Self Care | Attending: Emergency Medicine | Admitting: Emergency Medicine

## 2023-12-30 ENCOUNTER — Emergency Department (HOSPITAL_BASED_OUTPATIENT_CLINIC_OR_DEPARTMENT_OTHER)

## 2023-12-30 DIAGNOSIS — Z7982 Long term (current) use of aspirin: Secondary | ICD-10-CM | POA: Diagnosis not present

## 2023-12-30 DIAGNOSIS — R0789 Other chest pain: Secondary | ICD-10-CM | POA: Diagnosis not present

## 2023-12-30 DIAGNOSIS — I6523 Occlusion and stenosis of bilateral carotid arteries: Secondary | ICD-10-CM | POA: Diagnosis not present

## 2023-12-30 DIAGNOSIS — R079 Chest pain, unspecified: Secondary | ICD-10-CM | POA: Diagnosis not present

## 2023-12-30 DIAGNOSIS — Z79899 Other long term (current) drug therapy: Secondary | ICD-10-CM | POA: Diagnosis not present

## 2023-12-30 DIAGNOSIS — E039 Hypothyroidism, unspecified: Secondary | ICD-10-CM | POA: Diagnosis not present

## 2023-12-30 DIAGNOSIS — I672 Cerebral atherosclerosis: Secondary | ICD-10-CM | POA: Diagnosis not present

## 2023-12-30 DIAGNOSIS — I6782 Cerebral ischemia: Secondary | ICD-10-CM | POA: Diagnosis not present

## 2023-12-30 DIAGNOSIS — J90 Pleural effusion, not elsewhere classified: Secondary | ICD-10-CM | POA: Diagnosis not present

## 2023-12-30 DIAGNOSIS — R202 Paresthesia of skin: Secondary | ICD-10-CM | POA: Insufficient documentation

## 2023-12-30 DIAGNOSIS — R930 Abnormal findings on diagnostic imaging of skull and head, not elsewhere classified: Secondary | ICD-10-CM | POA: Diagnosis not present

## 2023-12-30 LAB — COMPREHENSIVE METABOLIC PANEL WITH GFR
ALT: 15 U/L (ref 0–44)
AST: 21 U/L (ref 15–41)
Albumin: 4.2 g/dL (ref 3.5–5.0)
Alkaline Phosphatase: 61 U/L (ref 38–126)
Anion gap: 9 (ref 5–15)
BUN: 11 mg/dL (ref 8–23)
CO2: 26 mmol/L (ref 22–32)
Calcium: 10 mg/dL (ref 8.9–10.3)
Chloride: 103 mmol/L (ref 98–111)
Creatinine, Ser: 0.75 mg/dL (ref 0.44–1.00)
GFR, Estimated: >60 mL/min (ref >60)
Glucose, Bld: 137 mg/dL — ABNORMAL HIGH (ref 70–99)
Potassium: 4.7 mmol/L (ref 3.5–5.1)
Sodium: 138 mmol/L (ref 135–145)
Total Bilirubin: 1.4 mg/dL — ABNORMAL HIGH (ref 0.0–1.2)
Total Protein: 7.2 g/dL (ref 6.5–8.1)

## 2023-12-30 LAB — CBC
HCT: 40.3 % (ref 36.0–46.0)
Hemoglobin: 12.8 g/dL (ref 12.0–15.0)
MCH: 27.5 pg (ref 26.0–34.0)
MCHC: 31.8 g/dL (ref 30.0–36.0)
MCV: 86.7 fL (ref 80.0–100.0)
Platelets: 276 10*3/uL (ref 150–400)
RBC: 4.65 MIL/uL (ref 3.87–5.11)
RDW: 14.7 % (ref 11.5–15.5)
WBC: 7.2 10*3/uL (ref 4.0–10.5)
nRBC: 0 % (ref 0.0–0.2)

## 2023-12-30 LAB — ETHANOL: Alcohol, Ethyl (B): <10 mg/dL (ref <10)

## 2023-12-30 LAB — TROPONIN I (HIGH SENSITIVITY)
Troponin I (High Sensitivity): 3 ng/L (ref <18)
Troponin I (High Sensitivity): 3 ng/L (ref <18)

## 2023-12-30 MED ORDER — IOHEXOL 350 MG/ML SOLN
75.0000 mL | Freq: Once | INTRAVENOUS | Status: AC | PRN
  Administered 2023-12-30: 75 mL via INTRAVENOUS

## 2023-12-30 NOTE — Discharge Instructions (Signed)
Contact a health care provider if you: ?Have paresthesia that gets worse or does not go away. ?Have numbness after an injury. ?Have a burning or prickling feeling that gets worse when you walk. ?Have pain, cramps, or dizziness, or you faint. ?Develop a rash. ?Get help right away if you: ?Feel muscle weakness. ?Develop new weakness in an arm or leg. ?Have trouble walking or moving. ?Have problems with speech, understanding, or vision. ?Feel confused. ?Cannot control your bladder or bowel movements. ?These symptoms may be an emergency. Get help right away. Call 911. ?Do not wait to see if the symptoms will go away. ?Do not drive yourself to the hospital. ?

## 2023-12-30 NOTE — ED Provider Notes (Signed)
 [place] [facility] Provider Note   CSN: [id number] Arrival date & time: 12/30/23  1730     History  Chief Complaint  Patient presents with   Tingling    Jamie Beltran is a 89 y.o. female.  With a past medical history of migraine headaches, hypothyroidism, cancer in remission, hiatal hernia.  Patient has been been having intermittent episodes of tingling and numbness on the left side of the face in the mandibular branch region of the facial nerve.  She is also been having numbness and tingling on the right arm simultaneously.  This comes and goes in and is fleeting.  Lasts no longer than 2 to 3 minutes and then disappears.  Patient states that this was followed by a headache behind her eyes.  She states that she does not frequently get headaches.  She says has been for several years since she had migraines but she does not remember symptoms like this.  Patient also reports that she had trouble feeling her right carotid artery until she took a baby aspirin.  Symptoms have been intermittent since Sunday.  She has no other neurologic deficits.  She is also reported some discomfort in her chest and back which she attributed to her hiatal hernia but was concerned about that as well.  Her last episode of that was this morning.  She has no symptoms at this time.  HPI     Home Medications Prior to Admission medications   Medication Sig Start Date End Date Taking? Authorizing Provider  aspirin EC 81 MG EC tablet Take 1 tablet (81 mg total) by mouth daily. Swallow whole. 05/02/21   Dorothy Spark, PA-C  Cholecalciferol (VITAMIN D) 50 MCG (2000 UT) tablet Take 2,000 Units by mouth daily.    [provider]  conjugated estrogens (PREMARIN) vaginal cream Place 1 Applicatorful vaginally daily. 10/08/22   Serena Croissant, MD  cycloSPORINE (RESTASIS) 0.05 % ophthalmic emulsion Place 1 drop into both eyes 2 (two) times daily.    [provider]   diclofenac sodium (VOLTAREN) 1 % GEL Apply 2 g topically daily as needed (pain).     [provider]  escitalopram (LEXAPRO) 20 MG tablet Take 20 mg by mouth daily. 11/11/19   [provider]  gabapentin (NEURONTIN) 300 MG capsule Take 300 mg by mouth 2 (two) times daily.     [provider]  HYDROcodone-acetaminophen (NORCO) 10-325 MG tablet Take 1 tablet by mouth every 4 (four) hours as needed for moderate pain. 04/26/21   Dorothy Spark, PA-C  hydrocortisone 2.5 % cream Apply 1 application topically 2 (two) times daily as needed (irritation).    [provider]  levothyroxine (SYNTHROID) 88 MCG tablet Take 88 mcg by mouth daily before breakfast.    [provider]  omeprazole (PRILOSEC) 40 MG capsule Take 40 mg by mouth daily.    [provider]      Allergies    Codeine, Penicillins, Adhesive [tape], Protonix [pantoprazole sodium], and Tapentadol    Review of Systems   Review of Systems  Physical Exam Updated Vital Signs BP (!) 169/78 (BP Location: Right Arm)   Pulse 75   Temp 97.7 F (36.5 C) (Oral)   Resp 16   Ht 5\' 4"  (1.626 m)   Wt 65.8 kg   SpO2 99%   BMI 24.89 kg/m  Physical Exam  Constitutional: Pt is oriented to person, place, and time. Pt appears well-developed and well-nourished.  No distress.  HENT:  Head: Normocephalic and atraumatic.  Mouth/Throat: Oropharynx is clear and moist.  Eyes: Conjunctivae and EOM are normal. Pupils are equal, round, and reactive to light. No scleral icterus.  No horizontal, vertical or rotational nystagmus  Neck: Normal range of motion. Neck supple.  Full active and passive ROM without pain No midline or paraspinal tenderness No nuchal rigidity or meningeal signs  Cardiovascular: Normal rate, regular rhythm and intact distal pulses.   Pulmonary/Chest: Effort normal and breath sounds normal. No respiratory distress. Pt has no wheezes. No rales.  Abdominal: Soft. Bowel sounds are  normal. There is no tenderness. There is no rebound and no guarding.  Musculoskeletal: Normal range of motion.  Lymphadenopathy:    No cervical adenopathy.  Neurological: Pt. is alert and oriented to person, place, and time. He has normal reflexes. No cranial nerve deficit.  Exhibits normal muscle tone. Coordination normal.  Mental Status:  Alert, oriented, thought content appropriate. Speech fluent without evidence of aphasia. Able to follow 2 step commands without difficulty.  Cranial Nerves:  II:  Peripheral visual fields grossly normal, pupils equal, round, reactive to light III,IV, VI: ptosis not present, extra-ocular motions intact bilaterally  V,VII: smile symmetric, facial light touch sensation equal VIII: hearing grossly normal bilaterally  IX,X: midline uvula rise  XI: bilateral shoulder shrug equal and strong XII: midline tongue extension  Motor:  5/5 in upper and lower extremities bilaterally including strong and equal grip strength and dorsiflexion/plantar flexion Sensory: Pinprick and light touch normal in all extremities.  Deep Tendon Reflexes: 2+ and symmetric  Cerebellar: normal finger-to-nose with bilateral upper extremities Gait: normal gait and balance CV: distal pulses palpable throughout   Skin: Skin is warm and dry. No rash noted. Pt is not diaphoretic.  Psychiatric: Pt has a normal mood and affect. Behavior is normal. Judgment and thought content normal.  Nursing note and vitals reviewed.  ED Results / Procedures / Treatments   Labs (all labs ordered are listed, but only abnormal results are displayed) Labs Reviewed - No data to display  EKG None  Radiology No results found.  Procedures Procedures    Medications Ordered in ED Medications - No data to display  ED Course/ Medical Decision Making/ A&P                                 Medical Decision Making Amount and/or Complexity of Data Reviewed Labs: ordered. Radiology:  ordered.  Risk Prescription drug management.   This patient presents to the ED  for concern of paresthesia- intermittent and headache along with intermittent chest discomfort The differential diagnosis of paresthesias includes but is not limited UE:[id number], diabetic neuropathy, es mellitus, entrapment neuropathy, eg, carpal tunnel syndrome, tarsal tunnel syndrome, meralgia paresthetica) hypocalcemia, multiple sclerosis, spinal cord lesion, nerve root compression, herpes zoster, transient ischemic attack, Guillain-Barr syndrome, trigeminal neuralgia, migraine, partial seizure, reflex sympathetic dystrophy, thoracic outlet syndrome, brachial plexus neuropathy.     Additional history obtained:  Additional history obtained from family member   Lab Tests:  I Ordered, and personally interpreted labs.  The pertinent results include:   Troponin neg x 2 CBC Ethanol wnl CMP mild hyperglycemia   Imaging Studies ordered:  I ordered imaging studies including CXR, CTA head and neck  I independently visualized and interpreted imaging which showed   Incidental BL plerual effusions (insignificant) no significant findings on CT angiogram.  Discussed all findings with the  patient at bedside. I agree with the radiologist interpretation     Problem List / ED Course:  Paresthesia  Pleural effusion   With paresthesias, negative chest workup.  Suspect this is related to her history of migraines.  Will have her follow-up with her PCP.           Final Clinical Impression(s) / ED Diagnoses Final diagnoses:  None    Rx / DC Orders ED Discharge Orders     None         Arthor Captain, PA-C 12/30/23 2324    Tegeler, Canary Brim, MD 12/30/23 2352

## 2023-12-30 NOTE — ED Notes (Signed)
 Patient is resting comfortably.

## 2023-12-30 NOTE — ED Triage Notes (Signed)
 Pt reports tingling around mouth intermittently since Sunday; had an episode around 12 noon, lasting about 1 min; RUE felt "strange" at that time; had a HA after this, but feels normal now; no neuro deficits at this time

## 2024-01-01 DIAGNOSIS — R600 Localized edema: Secondary | ICD-10-CM | POA: Diagnosis not present

## 2024-01-01 DIAGNOSIS — R8290 Unspecified abnormal findings in urine: Secondary | ICD-10-CM | POA: Diagnosis not present

## 2024-01-01 DIAGNOSIS — T7840XD Allergy, unspecified, subsequent encounter: Secondary | ICD-10-CM | POA: Diagnosis not present

## 2024-02-09 DIAGNOSIS — H43813 Vitreous degeneration, bilateral: Secondary | ICD-10-CM | POA: Diagnosis not present

## 2024-02-09 DIAGNOSIS — Z961 Presence of intraocular lens: Secondary | ICD-10-CM | POA: Diagnosis not present

## 2024-02-09 DIAGNOSIS — H18453 Nodular corneal degeneration, bilateral: Secondary | ICD-10-CM | POA: Diagnosis not present

## 2024-02-09 DIAGNOSIS — H532 Diplopia: Secondary | ICD-10-CM | POA: Diagnosis not present

## 2024-02-09 DIAGNOSIS — H18593 Other hereditary corneal dystrophies, bilateral: Secondary | ICD-10-CM | POA: Diagnosis not present

## 2024-02-19 DIAGNOSIS — N3000 Acute cystitis without hematuria: Secondary | ICD-10-CM | POA: Diagnosis not present

## 2024-02-19 DIAGNOSIS — K219 Gastro-esophageal reflux disease without esophagitis: Secondary | ICD-10-CM | POA: Diagnosis not present

## 2024-02-19 DIAGNOSIS — G8929 Other chronic pain: Secondary | ICD-10-CM | POA: Diagnosis not present

## 2024-02-19 DIAGNOSIS — F320 Major depressive disorder, single episode, mild: Secondary | ICD-10-CM | POA: Diagnosis not present

## 2024-02-19 DIAGNOSIS — D508 Other iron deficiency anemias: Secondary | ICD-10-CM | POA: Diagnosis not present

## 2024-02-19 DIAGNOSIS — E039 Hypothyroidism, unspecified: Secondary | ICD-10-CM | POA: Diagnosis not present

## 2024-02-19 DIAGNOSIS — N760 Acute vaginitis: Secondary | ICD-10-CM | POA: Diagnosis not present

## 2024-02-19 DIAGNOSIS — E7800 Pure hypercholesterolemia, unspecified: Secondary | ICD-10-CM | POA: Diagnosis not present

## 2024-04-13 ENCOUNTER — Other Ambulatory Visit: Payer: HMO

## 2024-04-13 ENCOUNTER — Ambulatory Visit
Admission: RE | Admit: 2024-04-13 | Discharge: 2024-04-13 | Disposition: A | Discharge status: Home / Self Care | Payer: HMO | Source: Ambulatory Visit | Attending: Family Medicine | Admitting: Family Medicine

## 2024-04-13 DIAGNOSIS — Z1231 Encounter for screening mammogram for malignant neoplasm of breast: Secondary | ICD-10-CM | POA: Diagnosis not present

## 2024-04-19 ENCOUNTER — Ambulatory Visit (HOSPITAL_BASED_OUTPATIENT_CLINIC_OR_DEPARTMENT_OTHER)
Admission: RE | Admit: 2024-04-19 | Discharge: 2024-04-19 | Disposition: A | Discharge status: Home / Self Care | Source: Ambulatory Visit | Attending: Family Medicine | Admitting: Family Medicine

## 2024-04-19 DIAGNOSIS — Z1231 Encounter for screening mammogram for malignant neoplasm of breast: Secondary | ICD-10-CM | POA: Insufficient documentation

## 2024-04-19 DIAGNOSIS — M810 Age-related osteoporosis without current pathological fracture: Secondary | ICD-10-CM | POA: Insufficient documentation

## 2024-04-19 DIAGNOSIS — Z780 Asymptomatic menopausal state: Secondary | ICD-10-CM | POA: Diagnosis not present

## 2024-04-19 DIAGNOSIS — Z13820 Encounter for screening for osteoporosis: Secondary | ICD-10-CM | POA: Diagnosis not present

## 2024-06-14 DIAGNOSIS — Z8719 Personal history of other diseases of the digestive system: Secondary | ICD-10-CM | POA: Diagnosis not present

## 2024-06-14 DIAGNOSIS — K625 Hemorrhage of anus and rectum: Secondary | ICD-10-CM | POA: Diagnosis not present

## 2024-08-23 DIAGNOSIS — G8929 Other chronic pain: Secondary | ICD-10-CM | POA: Diagnosis not present

## 2024-08-23 DIAGNOSIS — Z0000 Encounter for general adult medical examination without abnormal findings: Secondary | ICD-10-CM | POA: Diagnosis not present

## 2024-08-23 DIAGNOSIS — F320 Major depressive disorder, single episode, mild: Secondary | ICD-10-CM | POA: Diagnosis not present

## 2024-08-23 DIAGNOSIS — E7800 Pure hypercholesterolemia, unspecified: Secondary | ICD-10-CM | POA: Diagnosis not present

## 2024-08-23 DIAGNOSIS — N952 Postmenopausal atrophic vaginitis: Secondary | ICD-10-CM | POA: Diagnosis not present

## 2024-08-23 DIAGNOSIS — L309 Dermatitis, unspecified: Secondary | ICD-10-CM | POA: Diagnosis not present

## 2024-08-23 DIAGNOSIS — K219 Gastro-esophageal reflux disease without esophagitis: Secondary | ICD-10-CM | POA: Diagnosis not present

## 2024-08-23 DIAGNOSIS — E039 Hypothyroidism, unspecified: Secondary | ICD-10-CM | POA: Diagnosis not present
